# Patient Record
Sex: Male | Born: 1950 | Race: White | Hispanic: No | Marital: Married | Smoking: Current some day smoker
Health system: Southern US, Community
[De-identification: ages and names within clinical notes are randomized; demographics above are authoritative.]

## PROBLEM LIST (undated history)

## (undated) DIAGNOSIS — N529 Male erectile dysfunction, unspecified: Secondary | ICD-10-CM

## (undated) DIAGNOSIS — C801 Malignant (primary) neoplasm, unspecified: Secondary | ICD-10-CM

## (undated) DIAGNOSIS — Z524 Kidney donor: Secondary | ICD-10-CM

## (undated) DIAGNOSIS — N189 Chronic kidney disease, unspecified: Secondary | ICD-10-CM

## (undated) DIAGNOSIS — E119 Type 2 diabetes mellitus without complications: Secondary | ICD-10-CM

## (undated) DIAGNOSIS — K225 Diverticulum of esophagus, acquired: Secondary | ICD-10-CM

## (undated) DIAGNOSIS — I499 Cardiac arrhythmia, unspecified: Secondary | ICD-10-CM

## (undated) DIAGNOSIS — G4730 Sleep apnea, unspecified: Secondary | ICD-10-CM

## (undated) DIAGNOSIS — I10 Essential (primary) hypertension: Secondary | ICD-10-CM

## (undated) DIAGNOSIS — Z970 Presence of artificial eye: Secondary | ICD-10-CM

## (undated) DIAGNOSIS — I509 Heart failure, unspecified: Secondary | ICD-10-CM

## (undated) DIAGNOSIS — J449 Chronic obstructive pulmonary disease, unspecified: Secondary | ICD-10-CM

## (undated) DIAGNOSIS — I480 Paroxysmal atrial fibrillation: Secondary | ICD-10-CM

## (undated) DIAGNOSIS — I219 Acute myocardial infarction, unspecified: Secondary | ICD-10-CM

## (undated) DIAGNOSIS — I2510 Atherosclerotic heart disease of native coronary artery without angina pectoris: Secondary | ICD-10-CM

## (undated) DIAGNOSIS — F419 Anxiety disorder, unspecified: Secondary | ICD-10-CM

## (undated) DIAGNOSIS — K5790 Diverticulosis of intestine, part unspecified, without perforation or abscess without bleeding: Secondary | ICD-10-CM

## (undated) DIAGNOSIS — E785 Hyperlipidemia, unspecified: Secondary | ICD-10-CM

## (undated) DIAGNOSIS — K219 Gastro-esophageal reflux disease without esophagitis: Secondary | ICD-10-CM

## (undated) DIAGNOSIS — E039 Hypothyroidism, unspecified: Secondary | ICD-10-CM

## (undated) DIAGNOSIS — M1990 Unspecified osteoarthritis, unspecified site: Secondary | ICD-10-CM

## (undated) HISTORY — PX: MEDIAL PARTIAL KNEE REPLACEMENT: SHX5965

## (undated) HISTORY — DX: Type 2 diabetes mellitus without complications: E11.9

## (undated) HISTORY — DX: Diverticulum of esophagus, acquired: K22.5

## (undated) HISTORY — DX: Kidney donor: Z52.4

## (undated) HISTORY — DX: Diverticulosis of intestine, part unspecified, without perforation or abscess without bleeding: K57.90

## (undated) HISTORY — PX: HERNIA REPAIR: SHX51

## (undated) HISTORY — PX: TONSILLECTOMY: SUR1361

## (undated) HISTORY — DX: Chronic obstructive pulmonary disease, unspecified: J44.9

## (undated) HISTORY — PX: OTHER SURGICAL HISTORY: SHX169

## (undated) HISTORY — PX: UPPER GASTROINTESTINAL ENDOSCOPY: SHX188

## (undated) HISTORY — DX: Hyperlipidemia, unspecified: E78.5

## (undated) HISTORY — PX: COLONOSCOPY: SHX174

## (undated) HISTORY — DX: Chronic kidney disease, unspecified: N18.9

## (undated) HISTORY — DX: Gastro-esophageal reflux disease without esophagitis: K21.9

## (undated) HISTORY — PX: POLYPECTOMY: SHX149

---

## 1994-06-26 HISTORY — PX: KIDNEY DONATION: SHX685

## 2002-06-26 HISTORY — PX: CORONARY ARTERY BYPASS GRAFT: SHX141

## 2014-04-02 ENCOUNTER — Encounter (INDEPENDENT_AMBULATORY_CARE_PROVIDER_SITE_OTHER): Payer: Self-pay | Admitting: *Deleted

## 2014-04-16 ENCOUNTER — Ambulatory Visit (INDEPENDENT_AMBULATORY_CARE_PROVIDER_SITE_OTHER): Payer: Self-pay | Admitting: Internal Medicine

## 2015-06-27 DIAGNOSIS — D126 Benign neoplasm of colon, unspecified: Secondary | ICD-10-CM

## 2015-06-27 HISTORY — DX: Benign neoplasm of colon, unspecified: D12.6

## 2017-11-09 HISTORY — PX: CARDIAC CATHETERIZATION: SHX172

## 2019-05-15 ENCOUNTER — Other Ambulatory Visit: Payer: Self-pay | Admitting: Neurosurgery

## 2019-05-29 NOTE — H&P (Signed)
Patient ID:   [id number] Patient: Nathan Cross  Date of Birth: 1951-03-18 Visit Type: Office Visit   Date: 05/14/2019 03:00 PM Provider: Marchia Meiers. Vertell Limber MD   This 68 year old male presents for back pain.  HISTORY OF PRESENT ILLNESS:  1.  back pain  11/01/2018 left L2-3 microdiscectomy 05/01/2019 left L2-3 transforaminal ESI  Patient returns after reporting only 2 days relief from epidural injection.  He reports persistent low back and bilateral hamstring pain.  The patient continues to complain of significant pain and did not get sustained relief with injection.  Based on my review of his scoliosis radiographs and the severity of his MRI findings along with his continued pain, I have recommended proceeding with L1-2, L2-3, L3-4 XL IF with percutaneous pedicle screw fixation.  The patient wants to go ahead with surgery.  He says he is in intolerable pain.         Medical/Surgical/Interim History Reviewed, no change.  Last detailed document date:10/28/2018.     PAST MEDICAL HISTORY, SURGICAL HISTORY, FAMILY HISTORY, SOCIAL HISTORY AND REVIEW OF SYSTEMS I have reviewed the patient's past medical, surgical, family and social history as well as the comprehensive review of systems as included on the [facility] history form dated 10/28/2018, which I have signed.  Family History:  Reviewed, no changes.  Last detailed document date:10/28/2018.   Social History: Reviewed, no changes. Last detailed document date: 10/28/2018.    MEDICATIONS: (added, continued or stopped this visit) Started Medication Directions Instruction Stopped  05/07/2019 hydrocodone 5 mg-acetaminophen 325 mg tablet take 1 tablet by oral route  every 6 hours as needed for pain     lisinopril 5 mg tablet      metformin 500 mg tablet     04/23/2019 METHOCARBAMOL 500 MG TABLET TAKE 1 TABLET BY MOUTH THREE TIMES A DAY AS NEEDED     metoprolol tartrate 50 mg tablet      Xarelto 20  mg tablet        ALLERGIES: Ingredient Reaction Medication Name Comment  NO KNOWN ALLERGIES     No known allergies. Reviewed, no changes.    PHYSICAL EXAM:   Vitals Date Temp F BP Pulse Ht In Wt Lb BMI BSA Pain Score  05/14/2019 96.9 98/60 57 75 261.2 32.65  8/10      IMPRESSION:   Patient did not get significant improvement with injection.  He wants to go ahead with surgery.  PLAN:  L1-2, L2-3, L3-4 XL IF with percutaneous pedicle screw fixation.  The patient was given nursing education and we went over his studies in detail and the exact surgical plan and answered his questions.  He was given a prescription for an LSO brace.  Orders: Diagnostic Procedures: Assessment Procedure  M41.20 Scoliosis- AP/Lat  M54.16 Lumbar Spine- AP/Lat  Instruction(s)/Education: Assessment Instruction  [phone] Dietary management education, guidance, and counseling  Miscellaneous: Assessment   M43.16 LSO Brace   Completed Orders (this encounter) Order Details Reason Side Interpretation Result Initial Treatment Date Region  Scoliosis- AP/Lat      05/14/2019 All Levels to All Levels  Dietary management education, guidance, and counseling Encouraged patient to eat well balanced diet.         Assessment/Plan   # Detail Type Description   1. Assessment Radiculopathy, lumbar region (M54.16).       2. 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   Plan Orders LSO Brace. Clinical information/comments: gave patient written script.  3. Assessment Low back pain, unspecified back pain laterality, with sciatica presence unspecified (M54.5).       4. Assessment Disc displacement, lumbar (M51.26).       5. Assessment Scoliosis (and kyphoscoliosis), idiopathic (M41.20).       6. Assessment Body mass index (BMI) 32.0-32.9, adult FP:[id number]).   Plan Orders Today's instructions / counseling include(s) Dietary management education, guidance, and counseling. Clinical information/comments:  Encouraged patient to eat well balanced diet.         Pain Management Plan Pain Scale: 8/10. Method: Numeric Pain Intensity Scale. Location: back. Onset: 04/16/2019. Duration: varies. Quality: discomforting. Pain management follow-up plan of care: Patient will continue medication management.              Provider:  Marchia Meiers. Vertell Limber MD  05/16/2019 04:41 PM    Dictation edited by: Marchia Meiers. Vertell Limber    CC Providers: [facility] [place],  [place]  [id number]-   Najeeb Rehman  [facility] [address] [place], [place] [address]-               Electronically signed by Marchia Meiers Vertell Limber MD on 05/16/2019 04:41 PM

## 2019-06-05 NOTE — Progress Notes (Signed)
[id number] [address], [place] [address] Phone: [phone] Fax: [phone]      Your procedure is scheduled on Tuesday, June 10, 2019.  Report to [facility] Main Entrance "A" at 9:15 A.M., and check in at the Admitting office.  Call this number if you have problems the morning of surgery:  [phone]  Call [phone] if you have any questions prior to your surgery date Monday-Friday 8am-4pm    Remember:  Do not eat or drink after midnight the night before your surgery     Take these medicines the morning of surgery with A SIP OF WATER : Amiodarone (Pacerone) Clonazepam (Klonopin) Duloxetine (Cymbalta) Gabapentin (Neurontin) Gemfibrozil (Lopid) Hydrocodone-Acetaminophen (Norco/Vicodin) if needed Icosapent Ethyl (Vascepa) Levothyroxine (Synthroid) Methocarbamol (Robaxin) if needed Metoprolol succinate (toprol-XL) Omeprazole (Prilozec) Rosuvastatin (Crestor)  Follow your doctors instructions regarding when to STOP/HOLD Rivaroxaban (Xarelto). If no instructions were given, contact your doctor.  7 days prior to surgery STOP taking any Aspirin (unless otherwise instructed by your surgeon), Aleve, Naproxen, Ibuprofen, Motrin, Advil, Goody's, BC's, all herbal medications, fish oil, and all vitamins.   WHAT DO I DO ABOUT MY DIABETES MEDICATION?   Marland Kitchen Do not take oral diabetes medicines (pills) the morning of surgery. . Do NOT take Glucophage (Metformin) the morning of surgery.    HOW TO MANAGE YOUR DIABETES BEFORE AND AFTER SURGERY  Why is it important to control my blood sugar before and after surgery? . Improving blood sugar levels before and after surgery helps healing and can limit problems. . A way of improving blood sugar control is eating a healthy diet by: o  Eating less sugar and carbohydrates o  Increasing activity/exercise o  Talking with your doctor about reaching your blood sugar  goals . High blood sugars (greater than 180 mg/dL) can raise your risk of infections and slow your recovery, so you will need to focus on controlling your diabetes during the weeks before surgery. . Make sure that the doctor who takes care of your diabetes knows about your planned surgery including the date and location.  How do I manage my blood sugar before surgery? . Check your blood sugar at least 4 times a day, starting 2 days before surgery, to make sure that the level is not too high or low. . Check your blood sugar the morning of your surgery when you wake up and every 2 hours until you get to the Short Stay unit. o If your blood sugar is less than 70 mg/dL, you will need to treat for low blood sugar: - Do not take insulin. - Treat a low blood sugar (less than 70 mg/dL) with  cup of clear juice (cranberry or apple), 4 glucose tablets, OR glucose gel. - Recheck blood sugar in 15 minutes after treatment (to make sure it is greater than 70 mg/dL). If your blood sugar is not greater than 70 mg/dL on recheck, call [phone] for further instructions. . Report your blood sugar to the short stay nurse when you get to Short Stay.  . If you are admitted to the hospital after surgery: o Your blood sugar will be checked by the staff and you will probably be given insulin after surgery (instead of oral diabetes medicines) to make sure you have good blood sugar levels. o The goal for blood sugar control after surgery is 80-180 mg/dL.   The Morning of Surgery  Do not wear jewelry.  Do not wear  lotions, powders, or perfumes/colognes, or deodorant  Do not shave 48 hours prior to surgery.  Men may shave face and neck.  Do not bring valuables to the hospital.  [facility] is not responsible for any belongings or valuables.  If you are a smoker, DO NOT Smoke 24 hours prior to surgery  If you wear a CPAP at night please bring your mask, tubing, and machine the morning of surgery   Remember that  you must have someone to transport you home after your surgery, and remain with you for 24 hours if you are discharged the same day.   Please bring cases for contacts, glasses, hearing aids, dentures or bridgework because it cannot be worn into surgery.    Leave your suitcase in the car.  After surgery it may be brought to your room.  For patients admitted to the hospital, discharge time will be determined by your treatment team.  Patients discharged the day of surgery will not be allowed to drive home.    Special instructions:   [place]- Preparing For Surgery  Before surgery, you can play an important role. Because skin is not sterile, your skin needs to be as free of germs as possible. You can reduce the number of germs on your skin by washing with CHG (chlorahexidine gluconate) Soap before surgery.  CHG is an antiseptic cleaner which kills germs and bonds with the skin to continue killing germs even after washing.    Oral Hygiene is also important to reduce your risk of infection.  Remember - BRUSH YOUR TEETH THE MORNING OF SURGERY WITH YOUR REGULAR TOOTHPASTE  Please do not use if you have an allergy to CHG or antibacterial soaps. If your skin becomes reddened/irritated stop using the CHG.  Do not shave (including legs and underarms) for at least 48 hours prior to first CHG shower. It is OK to shave your face.  Please follow these instructions carefully.   1. Shower the NIGHT BEFORE SURGERY and the MORNING OF SURGERY with CHG Soap.   2. If you chose to wash your hair, wash your hair first as usual with your normal shampoo.  3. After you shampoo, rinse your hair and body thoroughly to remove the shampoo.  4. Use CHG as you would any other liquid soap. You can apply CHG directly to the skin and wash gently with a scrungie or a clean washcloth.   5. Apply the CHG Soap to your body ONLY FROM THE NECK DOWN.  Do not use on open wounds or open sores. Avoid contact with your eyes,  ears, mouth and genitals (private parts). Wash Face and genitals (private parts)  with your normal soap.   6. Wash thoroughly, paying special attention to the area where your surgery will be performed.  7. Thoroughly rinse your body with warm water from the neck down.  8. DO NOT shower/wash with your normal soap after using and rinsing off the CHG Soap.  9. Pat yourself dry with a CLEAN TOWEL.  10. Wear CLEAN PAJAMAS to bed the night before surgery, wear comfortable clothes the morning of surgery  11. Place CLEAN SHEETS on your bed the night of your first shower and DO NOT SLEEP WITH PETS.    Day of Surgery:  Please shower the morning of surgery with the CHG soap Do not apply any deodorants/lotions. Please wear clean clothes to the hospital/surgery center.   Remember to brush your teeth WITH YOUR REGULAR TOOTHPASTE.   Please read  over the following fact sheets that you were given.

## 2019-06-06 ENCOUNTER — Other Ambulatory Visit: Payer: Self-pay

## 2019-06-06 ENCOUNTER — Other Ambulatory Visit (HOSPITAL_COMMUNITY)
Admission: RE | Admit: 2019-06-06 | Discharge: 2019-06-06 | Disposition: A | Discharge status: Home / Self Care | Payer: Medicare Other | Source: Ambulatory Visit | Attending: Neurosurgery | Admitting: Neurosurgery

## 2019-06-06 ENCOUNTER — Encounter (HOSPITAL_COMMUNITY): Payer: Self-pay

## 2019-06-06 ENCOUNTER — Encounter (HOSPITAL_COMMUNITY)
Admission: RE | Admit: 2019-06-06 | Discharge: 2019-06-06 | Disposition: A | Discharge status: Home / Self Care | Payer: Medicare Other | Source: Ambulatory Visit | Attending: Neurosurgery | Admitting: Neurosurgery

## 2019-06-06 DIAGNOSIS — Z20828 Contact with and (suspected) exposure to other viral communicable diseases: Secondary | ICD-10-CM | POA: Diagnosis not present

## 2019-06-06 DIAGNOSIS — Z951 Presence of aortocoronary bypass graft: Secondary | ICD-10-CM | POA: Diagnosis not present

## 2019-06-06 DIAGNOSIS — R001 Bradycardia, unspecified: Secondary | ICD-10-CM | POA: Diagnosis not present

## 2019-06-06 DIAGNOSIS — Z6833 Body mass index (BMI) 33.0-33.9, adult: Secondary | ICD-10-CM | POA: Diagnosis not present

## 2019-06-06 DIAGNOSIS — Z01818 Encounter for other preprocedural examination: Secondary | ICD-10-CM | POA: Insufficient documentation

## 2019-06-06 DIAGNOSIS — Z7901 Long term (current) use of anticoagulants: Secondary | ICD-10-CM | POA: Diagnosis not present

## 2019-06-06 DIAGNOSIS — Z7984 Long term (current) use of oral hypoglycemic drugs: Secondary | ICD-10-CM | POA: Diagnosis not present

## 2019-06-06 DIAGNOSIS — E669 Obesity, unspecified: Secondary | ICD-10-CM | POA: Insufficient documentation

## 2019-06-06 DIAGNOSIS — I10 Essential (primary) hypertension: Secondary | ICD-10-CM | POA: Diagnosis not present

## 2019-06-06 DIAGNOSIS — Z905 Acquired absence of kidney: Secondary | ICD-10-CM | POA: Insufficient documentation

## 2019-06-06 DIAGNOSIS — E039 Hypothyroidism, unspecified: Secondary | ICD-10-CM | POA: Diagnosis not present

## 2019-06-06 DIAGNOSIS — I25810 Atherosclerosis of coronary artery bypass graft(s) without angina pectoris: Secondary | ICD-10-CM | POA: Insufficient documentation

## 2019-06-06 DIAGNOSIS — I480 Paroxysmal atrial fibrillation: Secondary | ICD-10-CM | POA: Insufficient documentation

## 2019-06-06 DIAGNOSIS — Z79899 Other long term (current) drug therapy: Secondary | ICD-10-CM | POA: Diagnosis not present

## 2019-06-06 DIAGNOSIS — G4733 Obstructive sleep apnea (adult) (pediatric): Secondary | ICD-10-CM | POA: Diagnosis not present

## 2019-06-06 DIAGNOSIS — Z79890 Hormone replacement therapy (postmenopausal): Secondary | ICD-10-CM | POA: Insufficient documentation

## 2019-06-06 DIAGNOSIS — I252 Old myocardial infarction: Secondary | ICD-10-CM | POA: Diagnosis not present

## 2019-06-06 DIAGNOSIS — Z87891 Personal history of nicotine dependence: Secondary | ICD-10-CM | POA: Insufficient documentation

## 2019-06-06 DIAGNOSIS — M4316 Spondylolisthesis, lumbar region: Secondary | ICD-10-CM | POA: Diagnosis not present

## 2019-06-06 HISTORY — DX: Paroxysmal atrial fibrillation: I48.0

## 2019-06-06 HISTORY — DX: Hypothyroidism, unspecified: E03.9

## 2019-06-06 HISTORY — DX: Essential (primary) hypertension: I10

## 2019-06-06 HISTORY — DX: Sleep apnea, unspecified: G47.30

## 2019-06-06 HISTORY — DX: Acute myocardial infarction, unspecified: I21.9

## 2019-06-06 LAB — BASIC METABOLIC PANEL
Anion gap: 10 (ref 5–15)
BUN: 15 mg/dL (ref 8–23)
CO2: 25 mmol/L (ref 22–32)
Calcium: 8.8 mg/dL — ABNORMAL LOW (ref 8.9–10.3)
Chloride: 108 mmol/L (ref 98–111)
Creatinine, Ser: 1.2 mg/dL (ref 0.61–1.24)
GFR calc Af Amer: >60 mL/min (ref >60)
GFR calc non Af Amer: >60 mL/min (ref >60)
Glucose, Bld: 111 mg/dL — ABNORMAL HIGH (ref 70–99)
Potassium: 4.5 mmol/L (ref 3.5–5.1)
Sodium: 143 mmol/L (ref 135–145)

## 2019-06-06 LAB — SURGICAL PCR SCREEN
MRSA, PCR: NEGATIVE
Staphylococcus aureus: NEGATIVE

## 2019-06-06 LAB — TYPE AND SCREEN
ABO/RH(D): O NEG
Antibody Screen: NEGATIVE

## 2019-06-06 LAB — CBC
HCT: 38.1 % — ABNORMAL LOW (ref 39.0–52.0)
Hemoglobin: 12.5 g/dL — ABNORMAL LOW (ref 13.0–17.0)
MCH: 30.7 pg (ref 26.0–34.0)
MCHC: 32.8 g/dL (ref 30.0–36.0)
MCV: 93.6 fL (ref 80.0–100.0)
Platelets: 210 10*3/uL (ref 150–400)
RBC: 4.07 MIL/uL — ABNORMAL LOW (ref 4.22–5.81)
RDW: 14 % (ref 11.5–15.5)
WBC: 7 10*3/uL (ref 4.0–10.5)
nRBC: 0 % (ref 0.0–0.2)

## 2019-06-06 LAB — HEMOGLOBIN A1C
Hgb A1c MFr Bld: 5.6 % (ref 4.8–5.6)
Mean Plasma Glucose: 114.02 mg/dL

## 2019-06-06 LAB — GLUCOSE, CAPILLARY: Glucose-Capillary: 90 mg/dL (ref 70–99)

## 2019-06-06 LAB — ABO/RH: ABO/RH(D): O NEG

## 2019-06-06 NOTE — Progress Notes (Signed)
PCP - Thornton Papas Cardiologist - Bosh Zakhary   Chest x-ray - n/a EKG - 06-06-19  SA - yes, wears CPAP  DM - Type 2 Fasting Blood Sugar - 100-120s   Blood Thinner Instructions: Last dose of Xarelto 06-05-19 Aspirin Instructions: Last dose 06-05-19   COVID TEST- Friday, Dec. 11th   Anesthesia review: yes, heart history  Patient denies shortness of breath, fever, cough and chest pain at PAT appointment   All instructions explained to the patient, with a verbal understanding of the material. Patient agrees to go over the instructions while at home for a better understanding. Patient also instructed to self quarantine after being tested for COVID-19. The opportunity to ask questions was provided.

## 2019-06-06 NOTE — Progress Notes (Signed)
[id number] [address], [place] [address] Phone: [phone] Fax: [phone]      Your procedure is scheduled on Tuesday, June 10, 2019.  Report to [facility] Main Entrance "A" at 9:15 A.M., and check in at the Admitting office.  Call this number if you have problems the morning of surgery:  [phone]  Call [phone] if you have any questions prior to your surgery date Monday-Friday 8am-4pm    Remember:  Do not eat or drink after midnight the night before your surgery     Take these medicines the morning of surgery with A SIP OF WATER :  Amiodarone (Pacerone)  Clonazepam (Klonopin)  Duloxetine (Cymbalta)  Gabapentin (Neurontin)  Gemfibrozil (Lopid)  Hydrocodone-Acetaminophen (Norco/Vicodin) if needed  Icosapent Ethyl (Vascepa)  Levothyroxine (Synthroid)  Methocarbamol (Robaxin) if needed  Metoprolol succinate (toprol-XL)  Omeprazole (Prilozec)  Rosuvastatin (Crestor)  Follow your doctors instructions regarding when to STOP/HOLD Rivaroxaban (Xarelto). If no instructions were given, contact your doctor.  7 days prior to surgery STOP taking any Aspirin (unless otherwise instructed by your surgeon), Aleve, Naproxen, Ibuprofen, Motrin, Advil, Goody's, BC's, all herbal medications, fish oil, and all vitamins.   WHAT DO I DO ABOUT MY DIABETES MEDICATION?   Marland Kitchen Do not take oral diabetes medicines (pills) the morning of surgery. . Do NOT take Glucophage (Metformin) the morning of surgery.    HOW TO MANAGE YOUR DIABETES BEFORE AND AFTER SURGERY  Why is it important to control my blood sugar before and after surgery? . Improving blood sugar levels before and after surgery helps healing and can limit problems. . A way of improving blood sugar control is eating a healthy diet by: o  Eating less sugar and carbohydrates o  Increasing activity/exercise o  Talking with your doctor about reaching your blood sugar  goals . High blood sugars (greater than 180 mg/dL) can raise your risk of infections and slow your recovery, so you will need to focus on controlling your diabetes during the weeks before surgery. . Make sure that the doctor who takes care of your diabetes knows about your planned surgery including the date and location.  How do I manage my blood sugar before surgery? . Check your blood sugar at least 4 times a day, starting 2 days before surgery, to make sure that the level is not too high or low. . Check your blood sugar the morning of your surgery when you wake up and every 2 hours until you get to the Short Stay unit. o If your blood sugar is less than 70 mg/dL, you will need to treat for low blood sugar: - Do not take insulin. - Treat a low blood sugar (less than 70 mg/dL) with  cup of clear juice (cranberry or apple), 4 glucose tablets, OR glucose gel. - Recheck blood sugar in 15 minutes after treatment (to make sure it is greater than 70 mg/dL). If your blood sugar is not greater than 70 mg/dL on recheck, call [phone] for further instructions. . Report your blood sugar to the short stay nurse when you get to Short Stay.  . If you are admitted to the hospital after surgery: o Your blood sugar will be checked by the staff and you will probably be given insulin after surgery (instead of oral diabetes medicines) to make sure you have good blood sugar levels. o The goal for blood sugar control after surgery is 80-180 mg/dL.   The  Morning of Surgery  Do not wear jewelry.  Do not wear lotions, powders, or perfumes, or deodorant  Do not shave 48 hours prior to surgery.    Do not bring valuables to the hospital.  [facility] is not responsible for any belongings or valuables.  If you are a smoker, DO NOT Smoke 24 hours prior to surgery  If you wear a CPAP at night please bring your mask, tubing, and machine the morning of surgery   Remember that you must have someone to transport you  home after your surgery, and remain with you for 24 hours if you are discharged the same day.   Please bring cases for contacts, glasses, hearing aids, dentures or bridgework because it cannot be worn into surgery.    Leave your suitcase in the car.  After surgery it may be brought to your room.  For patients admitted to the hospital, discharge time will be determined by your treatment team.  Patients discharged the day of surgery will not be allowed to drive home.    Special instructions:   [place]- Preparing For Surgery  Before surgery, you can play an important role. Because skin is not sterile, your skin needs to be as free of germs as possible. You can reduce the number of germs on your skin by washing with CHG (chlorahexidine gluconate) Soap before surgery.  CHG is an antiseptic cleaner which kills germs and bonds with the skin to continue killing germs even after washing.    Oral Hygiene is also important to reduce your risk of infection.  Remember - BRUSH YOUR TEETH THE MORNING OF SURGERY WITH YOUR REGULAR TOOTHPASTE  Please do not use if you have an allergy to CHG or antibacterial soaps. If your skin becomes reddened/irritated stop using the CHG.  Do not shave (including legs and underarms) for at least 48 hours prior to first CHG shower. It is OK to shave your face.  Please follow these instructions carefully.   1. Shower the NIGHT BEFORE SURGERY and the MORNING OF SURGERY with CHG Soap.   2. If you chose to wash your hair, wash your hair first as usual with your normal shampoo.  3. After you shampoo, rinse your hair and body thoroughly to remove the shampoo.  4. Use CHG as you would any other liquid soap. You can apply CHG directly to the skin and wash gently with a scrungie or a clean washcloth.   5. Apply the CHG Soap to your body ONLY FROM THE NECK DOWN.  Do not use on open wounds or open sores. Avoid contact with your eyes, ears, mouth and genitals (private parts).  Wash Face and genitals (private parts)  with your normal soap.   6. Wash thoroughly, paying special attention to the area where your surgery will be performed.  7. Thoroughly rinse your body with warm water from the neck down.  8. DO NOT shower/wash with your normal soap after using and rinsing off the CHG Soap.  9. Pat yourself dry with a CLEAN TOWEL.  10. Wear CLEAN PAJAMAS to bed the night before surgery, wear comfortable clothes the morning of surgery  11. Place CLEAN SHEETS on your bed the night of your first shower and DO NOT SLEEP WITH PETS.    Day of Surgery:  Please shower the morning of surgery with the CHG soap Do not apply any deodorants/lotions. Please wear clean clothes to the hospital/surgery center.   Remember to brush your teeth WITH  YOUR REGULAR TOOTHPASTE.   Please read over the following fact sheets that you were given.

## 2019-06-07 LAB — NOVEL CORONAVIRUS, NAA (HOSP ORDER, SEND-OUT TO REF LAB; TAT 18-24 HRS): SARS-CoV-2, NAA: NOT DETECTED

## 2019-06-09 ENCOUNTER — Encounter (HOSPITAL_COMMUNITY): Payer: Self-pay

## 2019-06-09 MED ORDER — DEXTROSE 5 % IV SOLN
3.0000 g | INTRAVENOUS | Status: AC
  Administered 2019-06-10 (×2): 3 g via INTRAVENOUS
  Filled 2019-06-09 (×3): qty 3000

## 2019-06-09 NOTE — Anesthesia Preprocedure Evaluation (Addendum)
Anesthesia Evaluation  Patient identified by MRN, date of birth, ID band Patient awake    Reviewed: Allergy & Precautions, NPO status , Patient's Chart, lab work & pertinent test results, reviewed documented beta blocker date and time   Airway Mallampati: II  TM Distance: >3 FB Neck ROM: Full    Dental  (+) Teeth Intact, Dental Advisory Given   Pulmonary sleep apnea and Continuous Positive Airway Pressure Ventilation , Current Smoker and Patient abstained from smoking.,    Pulmonary exam normal breath sounds clear to auscultation       Cardiovascular hypertension, Pt. on medications and Pt. on home beta blockers (-) angina+ CAD, + Past MI, + Cardiac Stents and + CABG  Normal cardiovascular exam+ dysrhythmias Atrial Fibrillation  Rhythm:Regular Rate:Normal  CAD (s/p CABG ~ 2004 in [place], [place]; by notes in [place], "LIMA-to-LAD, SVG-to-RPDA, SVG-to-OM, he does have a L radial artery graft"; s/p DES to SVG-RPDA ~ 2012; inferior STEMI with mild LV dysfunction 45% with occluded DES at SVG-RPDA in setting of not being on DAPT due to GI bleed, no revascularized 03/2014)  Nuclear stress test 05/06/19 ([facility]): Perfusion imaging: There is a moderate sized fixed perfusion abnormality of moderate intensity in the inferior wall from the base to the apex.  The remainder the ventricle has normal perfusion at rest and with stress. Wall motion: There is hypokinesis in the inferior wall from the base to the apex.  The remainder the ventricle functions normally.  The LV cavity size is normal at rest and with stress, is unchanged.  The ejection fraction is 69%. Overall: This vasodilator stress test is negative for ischemia, but with evidence of infarction.  LVEF 69%. (Comparison NST 11/10/14 in [facility]: Inferior myocardial infarction with peri-infarct ischemia, EF 62%)  Other studies outlined in 04/01/19 office note by Dr. Rosalita Chessman (will attempt  to get copies for [facility]): - TTE 11/12/17: " Ejection fraction 50% inferior wall hypokinesis LVH, biatrial enlargement, mild TR" (Comparison echo 02/01/15 in [facility]: LVEF 50-55%) - Cardiac cath 11/09/17: " Normal right-sided pressures, 80% proximal LAD, 100% ostial circ, 100% RCA, SVG-RCA 100%, SVG-OM patent, LIMA-LAD patent, L-L and L-R collateral of distal cx and PDA"   Neuro/Psych negative neurological ROS  negative psych ROS   GI/Hepatic Neg liver ROS, GERD  Medicated,  Endo/Other  diabetes, Type 2, Oral Hypoglycemic AgentsHypothyroidism Obesity   Renal/GU S/p nephrectomy     Musculoskeletal negative musculoskeletal ROS (+)   Abdominal   Peds  Hematology  (+) Blood dyscrasia (Xarelto), anemia ,   Anesthesia Other Findings Day of surgery medications reviewed with the patient.  Reproductive/Obstetrics                            Anesthesia Physical Anesthesia Plan  ASA: III  Anesthesia Plan: General   Post-op Pain Management:    Induction: Intravenous  PONV Risk Score and Plan: 2 and Dexamethasone, Ondansetron and Midazolam  Airway Management Planned: Oral ETT  Additional Equipment:   Intra-op Plan:   Post-operative Plan: Extubation in OR  Informed Consent: I have reviewed the patients History and Physical, chart, labs and discussed the procedure including the risks, benefits and alternatives for the proposed anesthesia with the patient or authorized representative who has indicated his/her understanding and acceptance.     Dental advisory given  Plan Discussed with: CRNA  Anesthesia Plan Comments: (PAT note written 06/09/2019 by Myra Gianotti, PA-C. )  Anesthesia Quick Evaluation  

## 2019-06-09 NOTE — Progress Notes (Addendum)
Anesthesia Chart Review:  Case: [id number] Date/Time: 06/10/19 1103   Procedures:      Lumbar 1-2, Lumbar 2-3, Lumbar 3-4 Anterolateral decompression/fusion with percutaneous pedicle screw fixation (N/A ) - Lumbar 1-2, Lumbar 2-3, Lumbar 3-4 Anterolateral decompression/fusion with percutaneous pedicle screw fixation     LUMBAR PERCUTANEOUS PEDICLE SCREW 3 LEVEL (N/A )   Anesthesia type: General   Pre-op diagnosis: Spondylolisthesis, Lumbar region   Location: [facility] OR ROOM 18 / [facility]   Surgeons: Erline Levine, MD      DISCUSSION: Patient is a 68 year old male scheduled for the above procedure.  History includes smoking, CAD (s/p CABG ~ 2004 in [place], [place]; by notes in [place], "LIMA-to-LAD, SVG-to-RPDA, SVG-to-OM, he does have a L radial artery graft"; s/p DES to SVG-RPDA ~ 2012; inferior STEMI with mild LV dysfunction 45% with occluded DES at SVG-RPDA in setting of not being on DAPT due to GI bleed, no revascularized 03/2014), PAF, OSA (CPAP), HTN, hypothyroidism, left prosthetic eye. 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 BMI is consistent with obesity.  His cardiologist Dr. Rosalita Chessman signed a note of cardiac clearance for this procedure. He gave permission to hold blood thinners for 48 hours prior to surgery and resume post-operatively. At PAT, patient reported, last Xarelto and ASA 06/05/19.   Currently only clearance letter and 2016-2017 cardiology records [facility]) available. Urgent requested for most recent cardiac records sent to [facility] 06/09/19 at 10:30 PM--no records received by 4:00 PM, so I contact [facility] staff who reported that patient was last seen ~ 03/2019 and had a recent stress test showing evidence of prior infarct with plans to continue medical therapy. Staff to fax records, but not received as of 5:05 PM. Will need to contact Dr. Milta Deiters office at [phone] if not received by arrival 06/10/19.    06/06/19 COVID-19 test negative. He is for PT/INR on the day of surgery.  ADDENDUM 06/10/19 9:40 AM: 04/01/19 office note and 05/06/19 non-ischemic stress test received from [facility]. See CV section.     VS: BP 133/70   Pulse (!) 58   Temp (!) 36.4 C (Oral)   Resp 20   Ht 6\' 3"  (1.905 m)   Wt 119.9 kg   SpO2 97%   BMI 33.05 kg/m   PROVIDERS: Frances Maywood, FNP is PCP Delanna Notice, MD is cardiologist ([place]). It appears that previously he was followed by [facility]iology in from 10/30/14-08/09/15 (see Care Everywhere).   LABS: Labs reviewed: Acceptable for surgery. (all labs ordered are listed, but only abnormal results are displayed)  Labs Reviewed  BASIC METABOLIC PANEL - Abnormal; Notable for the following components:      Result Value   Glucose, Bld 111 (*)    Calcium 8.8 (*)    All other components within normal limits  CBC - Abnormal; Notable for the following components:   RBC 4.07 (*)    Hemoglobin 12.5 (*)    HCT 38.1 (*)    All other components within normal limits  SURGICAL PCR SCREEN  GLUCOSE, CAPILLARY  HEMOGLOBIN A1C  TYPE AND SCREEN     EKG: 06/06/19: Sinus bradycardia at 54 bpm Otherwise normal ECG Confirmed by Croitoru, Mihai [id number]) on 06/07/2019 5:57:48 PM   CV:  Nuclear stress test 05/06/19 ([facility]): Perfusion imaging: There is a moderate sized fixed perfusion abnormality of moderate intensity in  the inferior wall from the base to the apex.  The remainder the ventricle has normal perfusion at rest and with stress. Wall motion: There is hypokinesis in the inferior wall from the base to the apex.  The remainder the ventricle functions normally.  The LV cavity size is normal at rest and with stress, is unchanged.  The ejection fraction is 69%. Overall: This vasodilator stress test is negative for ischemia, but with evidence of infarction.  LVEF 69%. (Comparison NST 11/10/14 in [facility] CE: Inferior  myocardial infarction with peri-infarct ischemia, EF 62%)  Other studies outlined in 04/01/19 office note by Dr. Rosalita Chessman (will attempt to get copies for [facility]): - TTE 11/12/17: " Ejection fraction 50% inferior wall hypokinesis LVH, biatrial enlargement, mild TR" (Comparison echo 02/01/15 in [facility] CE: LVEF 50-55%) - Cardiac cath 11/09/17: " Normal right-sided pressures, 80% proximal LAD, 100% ostial circ, 100% RCA, SVG-RCA 100%, SVG-OM patent, LIMA-LAD patent, L-L and L-R collateral of distal cx and PDA"    Past Medical History:  Diagnosis Date  . Hypertension   . Hypothyroidism   . Myocardial infarction ([place])   . PAF (paroxysmal atrial fibrillation) ([place])   . Sleep apnea     Past Surgical History:  Procedure Laterality Date  . CORONARY ARTERY BYPASS GRAFT  2004  . HERNIA REPAIR    . MEDIAL PARTIAL KNEE REPLACEMENT Bilateral   . Prosthetic Eye Left     MEDICATIONS: . amiodarone (PACERONE) 200 MG tablet  . clobetasol cream (TEMOVATE) 0.05 %  . clonazePAM (KLONOPIN) 0.5 MG tablet  . docusate sodium (COLACE) 100 MG capsule  . DULoxetine (CYMBALTA) 30 MG capsule  . furosemide (LASIX) 20 MG tablet  . gabapentin (NEURONTIN) 300 MG capsule  . gemfibrozil (LOPID) 600 MG tablet  . HYDROcodone-acetaminophen (NORCO/VICODIN) 5-325 MG tablet  . icosapent Ethyl (VASCEPA) 1 g capsule  . levothyroxine (SYNTHROID) 50 MCG tablet  . lisinopril (ZESTRIL) 5 MG tablet  . metFORMIN (GLUCOPHAGE) 500 MG tablet  . methocarbamol (ROBAXIN) 500 MG tablet  . metoprolol succinate (TOPROL-XL) 50 MG 24 hr tablet  . naproxen sodium (ALEVE) 220 MG tablet  . omeprazole (PRILOSEC) 20 MG capsule  . rivaroxaban (XARELTO) 20 MG TABS tablet  . rosuvastatin (CRESTOR) 20 MG tablet  . vitamin C (ASCORBIC ACID) 500 MG tablet   No current facility-administered medications for this encounter.   Derrill Memo ON 06/10/2019] ceFAZolin (ANCEF) 3 g in dextrose 5 % 50 mL IVPB    Myra Gianotti, PA-C Surgical Short  Stay/Anesthesiology [facility] Phone [phone] [facility] Phone [phone] 06/09/2019 5:05 PM

## 2019-06-10 ENCOUNTER — Inpatient Hospital Stay (HOSPITAL_COMMUNITY): Payer: Medicare Other

## 2019-06-10 ENCOUNTER — Inpatient Hospital Stay (HOSPITAL_COMMUNITY): Payer: Medicare Other | Admitting: Anesthesiology

## 2019-06-10 ENCOUNTER — Encounter (HOSPITAL_COMMUNITY): Payer: Self-pay | Admitting: Neurosurgery

## 2019-06-10 ENCOUNTER — Inpatient Hospital Stay (HOSPITAL_COMMUNITY)
Admission: RE | Disposition: A | Discharge status: Home / Self Care | Payer: Self-pay | Source: Home / Self Care | Attending: Neurosurgery

## 2019-06-10 ENCOUNTER — Inpatient Hospital Stay (HOSPITAL_COMMUNITY)
Admission: RE | Admit: 2019-06-10 | Discharge: 2019-06-11 | DRG: 460 | Disposition: A | Discharge status: Home / Self Care | Payer: Medicare Other | Attending: Neurosurgery | Admitting: Neurosurgery

## 2019-06-10 ENCOUNTER — Other Ambulatory Visit: Payer: Self-pay

## 2019-06-10 ENCOUNTER — Inpatient Hospital Stay (HOSPITAL_COMMUNITY): Payer: Medicare Other | Admitting: Vascular Surgery

## 2019-06-10 DIAGNOSIS — F17200 Nicotine dependence, unspecified, uncomplicated: Secondary | ICD-10-CM | POA: Diagnosis present

## 2019-06-10 DIAGNOSIS — Z7984 Long term (current) use of oral hypoglycemic drugs: Secondary | ICD-10-CM

## 2019-06-10 DIAGNOSIS — E119 Type 2 diabetes mellitus without complications: Secondary | ICD-10-CM | POA: Diagnosis present

## 2019-06-10 DIAGNOSIS — Z951 Presence of aortocoronary bypass graft: Secondary | ICD-10-CM | POA: Diagnosis not present

## 2019-06-10 DIAGNOSIS — I10 Essential (primary) hypertension: Secondary | ICD-10-CM | POA: Diagnosis present

## 2019-06-10 DIAGNOSIS — M5116 Intervertebral disc disorders with radiculopathy, lumbar region: Secondary | ICD-10-CM | POA: Diagnosis present

## 2019-06-10 DIAGNOSIS — M4126 Other idiopathic scoliosis, lumbar region: Secondary | ICD-10-CM | POA: Diagnosis present

## 2019-06-10 DIAGNOSIS — I252 Old myocardial infarction: Secondary | ICD-10-CM | POA: Diagnosis not present

## 2019-06-10 DIAGNOSIS — G4730 Sleep apnea, unspecified: Secondary | ICD-10-CM | POA: Diagnosis present

## 2019-06-10 DIAGNOSIS — Z79899 Other long term (current) drug therapy: Secondary | ICD-10-CM | POA: Diagnosis not present

## 2019-06-10 DIAGNOSIS — Z955 Presence of coronary angioplasty implant and graft: Secondary | ICD-10-CM | POA: Diagnosis not present

## 2019-06-10 DIAGNOSIS — M4316 Spondylolisthesis, lumbar region: Principal | ICD-10-CM | POA: Diagnosis present

## 2019-06-10 DIAGNOSIS — Z6832 Body mass index (BMI) 32.0-32.9, adult: Secondary | ICD-10-CM | POA: Diagnosis not present

## 2019-06-10 DIAGNOSIS — M48061 Spinal stenosis, lumbar region without neurogenic claudication: Secondary | ICD-10-CM | POA: Diagnosis present

## 2019-06-10 DIAGNOSIS — I2510 Atherosclerotic heart disease of native coronary artery without angina pectoris: Secondary | ICD-10-CM | POA: Diagnosis present

## 2019-06-10 DIAGNOSIS — E669 Obesity, unspecified: Secondary | ICD-10-CM | POA: Diagnosis present

## 2019-06-10 DIAGNOSIS — M419 Scoliosis, unspecified: Secondary | ICD-10-CM | POA: Diagnosis present

## 2019-06-10 DIAGNOSIS — Z7901 Long term (current) use of anticoagulants: Secondary | ICD-10-CM

## 2019-06-10 DIAGNOSIS — K219 Gastro-esophageal reflux disease without esophagitis: Secondary | ICD-10-CM | POA: Diagnosis present

## 2019-06-10 DIAGNOSIS — Z905 Acquired absence of kidney: Secondary | ICD-10-CM | POA: Diagnosis not present

## 2019-06-10 DIAGNOSIS — Z79890 Hormone replacement therapy (postmenopausal): Secondary | ICD-10-CM

## 2019-06-10 DIAGNOSIS — Z419 Encounter for procedure for purposes other than remedying health state, unspecified: Secondary | ICD-10-CM

## 2019-06-10 HISTORY — PX: ANTERIOR LAT LUMBAR FUSION: SHX1168

## 2019-06-10 HISTORY — PX: LUMBAR PERCUTANEOUS PEDICLE SCREW 3 LEVEL: SHX5562

## 2019-06-10 LAB — GLUCOSE, CAPILLARY: Glucose-Capillary: 116 mg/dL — ABNORMAL HIGH (ref 70–99)

## 2019-06-10 LAB — PROTIME-INR
INR: 1 (ref 0.8–1.2)
Prothrombin Time: 13 seconds (ref 11.4–15.2)

## 2019-06-10 SURGERY — ANTERIOR LATERAL LUMBAR FUSION 3 LEVELS
Anesthesia: General

## 2019-06-10 MED ORDER — HYDROMORPHONE HCL 1 MG/ML IJ SOLN
1.0000 mg | INTRAMUSCULAR | Status: DC | PRN
  Administered 2019-06-10: 21:00:00 1 mg via INTRAVENOUS
  Filled 2019-06-10: qty 1

## 2019-06-10 MED ORDER — ACETAMINOPHEN 10 MG/ML IV SOLN
INTRAVENOUS | Status: AC
  Filled 2019-06-10: qty 100

## 2019-06-10 MED ORDER — LEVOTHYROXINE SODIUM 25 MCG PO TABS
50.0000 ug | ORAL_TABLET | Freq: Every day | ORAL | Status: DC
  Administered 2019-06-11: 07:00:00 50 ug via ORAL
  Filled 2019-06-10: qty 2

## 2019-06-10 MED ORDER — THROMBIN 20000 UNITS EX SOLR
CUTANEOUS | Status: AC
  Filled 2019-06-10: qty 20000

## 2019-06-10 MED ORDER — MIDAZOLAM HCL 5 MG/5ML IJ SOLN
1.0000 mg | Freq: Once | INTRAMUSCULAR | Status: AC
  Administered 2019-06-10: 17:00:00 1 mg via INTRAVENOUS

## 2019-06-10 MED ORDER — EPHEDRINE SULFATE 50 MG/ML IJ SOLN
INTRAMUSCULAR | Status: DC | PRN
  Administered 2019-06-10 (×5): 10 mg via INTRAVENOUS

## 2019-06-10 MED ORDER — ACETAMINOPHEN 10 MG/ML IV SOLN
1000.0000 mg | Freq: Once | INTRAVENOUS | Status: AC
  Administered 2019-06-10: 18:00:00 1000 mg via INTRAVENOUS

## 2019-06-10 MED ORDER — DOCUSATE SODIUM 100 MG PO CAPS: 200.0000 mg | ORAL_CAPSULE | Freq: Every day | ORAL | Status: DC

## 2019-06-10 MED ORDER — PHENYLEPHRINE HCL (PRESSORS) 10 MG/ML IV SOLN
INTRAVENOUS | Status: DC | PRN
  Administered 2019-06-10: 120 ug via INTRAVENOUS
  Administered 2019-06-10 (×2): 80 ug via INTRAVENOUS

## 2019-06-10 MED ORDER — ACETAMINOPHEN 650 MG RE SUPP: 650.0000 mg | RECTAL | Status: DC | PRN

## 2019-06-10 MED ORDER — ICOSAPENT ETHYL 1 G PO CAPS
1.0000 g | ORAL_CAPSULE | Freq: Every day | ORAL | Status: DC
  Administered 2019-06-11: 09:00:00 1 g via ORAL
  Filled 2019-06-10 (×2): qty 1

## 2019-06-10 MED ORDER — BISACODYL 10 MG RE SUPP: 10.0000 mg | Freq: Every day | RECTAL | Status: DC | PRN

## 2019-06-10 MED ORDER — LACTATED RINGERS IV SOLN: INTRAVENOUS | Status: DC

## 2019-06-10 MED ORDER — SODIUM CHLORIDE 0.9 % IV SOLN: 250.0000 mL | INTRAVENOUS | Status: DC

## 2019-06-10 MED ORDER — METHOCARBAMOL 1000 MG/10ML IJ SOLN
500.0000 mg | Freq: Four times a day (QID) | INTRAVENOUS | Status: DC | PRN
  Administered 2019-06-11: 01:00:00 500 mg via INTRAVENOUS
  Filled 2019-06-10: qty 5
  Filled 2019-06-10: qty 500

## 2019-06-10 MED ORDER — AMIODARONE HCL 100 MG PO TABS
100.0000 mg | ORAL_TABLET | Freq: Every day | ORAL | Status: DC
  Administered 2019-06-11: 09:00:00 100 mg via ORAL
  Filled 2019-06-10: qty 1

## 2019-06-10 MED ORDER — METHOCARBAMOL 500 MG PO TABS
ORAL_TABLET | ORAL | Status: AC
  Filled 2019-06-10: qty 1

## 2019-06-10 MED ORDER — FLEET ENEMA 7-19 GM/118ML RE ENEM: 1.0000 | ENEMA | Freq: Once | RECTAL | Status: DC | PRN

## 2019-06-10 MED ORDER — MENTHOL 3 MG MT LOZG: 1.0000 | LOZENGE | OROMUCOSAL | Status: DC | PRN

## 2019-06-10 MED ORDER — THROMBIN 5000 UNITS EX SOLR
CUTANEOUS | Status: AC
  Filled 2019-06-10: qty 5000

## 2019-06-10 MED ORDER — HYDROCODONE-ACETAMINOPHEN 5-325 MG PO TABS: 1.0000 | ORAL_TABLET | Freq: Four times a day (QID) | ORAL | Status: DC | PRN

## 2019-06-10 MED ORDER — GEMFIBROZIL 600 MG PO TABS
600.0000 mg | ORAL_TABLET | Freq: Two times a day (BID) | ORAL | Status: DC
  Administered 2019-06-11: 09:00:00 600 mg via ORAL
  Filled 2019-06-10 (×2): qty 1

## 2019-06-10 MED ORDER — VASOPRESSIN 20 UNIT/ML IV SOLN
INTRAVENOUS | Status: AC
  Filled 2019-06-10: qty 1

## 2019-06-10 MED ORDER — POLYETHYLENE GLYCOL 3350 17 G PO PACK: 17.0000 g | PACK | Freq: Every day | ORAL | Status: DC | PRN

## 2019-06-10 MED ORDER — SODIUM CHLORIDE 0.9% FLUSH: 3.0000 mL | INTRAVENOUS | Status: DC | PRN

## 2019-06-10 MED ORDER — ONDANSETRON HCL 4 MG/2ML IJ SOLN: 4.0000 mg | Freq: Once | INTRAMUSCULAR | Status: DC | PRN

## 2019-06-10 MED ORDER — ROSUVASTATIN CALCIUM 20 MG PO TABS
20.0000 mg | ORAL_TABLET | Freq: Every day | ORAL | Status: DC
  Administered 2019-06-11: 08:00:00 20 mg via ORAL
  Filled 2019-06-10: qty 1

## 2019-06-10 MED ORDER — PROPOFOL 500 MG/50ML IV EMUL: INTRAVENOUS | Status: DC | PRN

## 2019-06-10 MED ORDER — BUPIVACAINE HCL (PF) 0.5 % IJ SOLN
INTRAMUSCULAR | Status: AC
  Filled 2019-06-10: qty 30

## 2019-06-10 MED ORDER — BUPIVACAINE LIPOSOME 1.3 % IJ SUSP
20.0000 mL | INTRAMUSCULAR | Status: AC
  Administered 2019-06-10: 16:00:00 20 mL
  Filled 2019-06-10: qty 20

## 2019-06-10 MED ORDER — HYDROMORPHONE HCL 1 MG/ML IJ SOLN
INTRAMUSCULAR | Status: AC
  Filled 2019-06-10: qty 1

## 2019-06-10 MED ORDER — CHLORHEXIDINE GLUCONATE CLOTH 2 % EX PADS: 6.0000 | MEDICATED_PAD | Freq: Once | CUTANEOUS | Status: DC

## 2019-06-10 MED ORDER — PROPOFOL 500 MG/50ML IV EMUL
INTRAVENOUS | Status: DC | PRN
  Administered 2019-06-10: 50 ug/kg/min via INTRAVENOUS

## 2019-06-10 MED ORDER — ROCURONIUM BROMIDE 10 MG/ML (PF) SYRINGE
PREFILLED_SYRINGE | INTRAVENOUS | Status: AC
  Filled 2019-06-10: qty 30

## 2019-06-10 MED ORDER — FENTANYL CITRATE (PF) 250 MCG/5ML IJ SOLN
INTRAMUSCULAR | Status: AC
  Filled 2019-06-10: qty 5

## 2019-06-10 MED ORDER — DOCUSATE SODIUM 100 MG PO CAPS
100.0000 mg | ORAL_CAPSULE | Freq: Two times a day (BID) | ORAL | Status: DC
  Administered 2019-06-11: 100 mg via ORAL
  Filled 2019-06-10: qty 1

## 2019-06-10 MED ORDER — LISINOPRIL 10 MG PO TABS
5.0000 mg | ORAL_TABLET | Freq: Every day | ORAL | Status: DC
  Administered 2019-06-11: 08:00:00 5 mg via ORAL
  Filled 2019-06-10: qty 1

## 2019-06-10 MED ORDER — PROPOFOL 1000 MG/100ML IV EMUL
INTRAVENOUS | Status: AC
  Filled 2019-06-10: qty 100

## 2019-06-10 MED ORDER — 0.9 % SODIUM CHLORIDE (POUR BTL) OPTIME
TOPICAL | Status: DC | PRN
  Administered 2019-06-10 (×2): 1000 mL

## 2019-06-10 MED ORDER — ALUM & MAG HYDROXIDE-SIMETH 200-200-20 MG/5ML PO SUSP: 30.0000 mL | Freq: Four times a day (QID) | ORAL | Status: DC | PRN

## 2019-06-10 MED ORDER — LIDOCAINE-EPINEPHRINE 1 %-1:100000 IJ SOLN
INTRAMUSCULAR | Status: AC
  Filled 2019-06-10: qty 1

## 2019-06-10 MED ORDER — ONDANSETRON HCL 4 MG PO TABS: 4.0000 mg | ORAL_TABLET | Freq: Four times a day (QID) | ORAL | Status: DC | PRN

## 2019-06-10 MED ORDER — THROMBIN 5000 UNITS EX SOLR
OROMUCOSAL | Status: DC | PRN
  Administered 2019-06-10 (×2): 5 mL via TOPICAL

## 2019-06-10 MED ORDER — ONDANSETRON HCL 4 MG/2ML IJ SOLN: 4.0000 mg | Freq: Four times a day (QID) | INTRAMUSCULAR | Status: DC | PRN

## 2019-06-10 MED ORDER — METFORMIN HCL 500 MG PO TABS
500.0000 mg | ORAL_TABLET | Freq: Two times a day (BID) | ORAL | Status: DC
  Administered 2019-06-11: 09:00:00 500 mg via ORAL
  Filled 2019-06-10: qty 1

## 2019-06-10 MED ORDER — PANTOPRAZOLE SODIUM 40 MG PO TBEC
40.0000 mg | DELAYED_RELEASE_TABLET | Freq: Every day | ORAL | Status: DC
  Administered 2019-06-11: 08:00:00 40 mg via ORAL
  Filled 2019-06-10: qty 1

## 2019-06-10 MED ORDER — OXYCODONE HCL 5 MG PO TABS
5.0000 mg | ORAL_TABLET | ORAL | Status: DC | PRN
  Administered 2019-06-10: 19:00:00 5 mg via ORAL

## 2019-06-10 MED ORDER — OXYCODONE HCL 5 MG PO TABS
ORAL_TABLET | ORAL | Status: AC
  Filled 2019-06-10: qty 1

## 2019-06-10 MED ORDER — GABAPENTIN 300 MG PO CAPS
300.0000 mg | ORAL_CAPSULE | Freq: Three times a day (TID) | ORAL | Status: DC
  Administered 2019-06-10 – 2019-06-11 (×2): 300 mg via ORAL
  Filled 2019-06-10: qty 1
  Filled 2019-06-10: qty 3

## 2019-06-10 MED ORDER — FUROSEMIDE 20 MG PO TABS
20.0000 mg | ORAL_TABLET | Freq: Every day | ORAL | Status: DC
  Administered 2019-06-11: 09:00:00 20 mg via ORAL
  Filled 2019-06-10: qty 1

## 2019-06-10 MED ORDER — SODIUM CHLORIDE 0.9% FLUSH: 3.0000 mL | Freq: Two times a day (BID) | INTRAVENOUS | Status: DC

## 2019-06-10 MED ORDER — MIDAZOLAM HCL 2 MG/2ML IJ SOLN
INTRAMUSCULAR | Status: DC | PRN
  Administered 2019-06-10: 2 mg via INTRAVENOUS

## 2019-06-10 MED ORDER — CEFAZOLIN SODIUM 1 G IJ SOLR
INTRAMUSCULAR | Status: AC
  Filled 2019-06-10: qty 30

## 2019-06-10 MED ORDER — HYDROCODONE-ACETAMINOPHEN 5-325 MG PO TABS: 2.0000 | ORAL_TABLET | ORAL | Status: DC | PRN

## 2019-06-10 MED ORDER — ZOLPIDEM TARTRATE 5 MG PO TABS: 5.0000 mg | ORAL_TABLET | Freq: Every evening | ORAL | Status: DC | PRN

## 2019-06-10 MED ORDER — LIDOCAINE-EPINEPHRINE 1 %-1:100000 IJ SOLN
INTRAMUSCULAR | Status: DC | PRN
  Administered 2019-06-10: 10 mL
  Administered 2019-06-10: 9 mL

## 2019-06-10 MED ORDER — ASCORBIC ACID 500 MG PO TABS
500.0000 mg | ORAL_TABLET | Freq: Every day | ORAL | Status: DC
  Administered 2019-06-11: 09:00:00 500 mg via ORAL
  Filled 2019-06-10: qty 1

## 2019-06-10 MED ORDER — MIDAZOLAM HCL 2 MG/2ML IJ SOLN
INTRAMUSCULAR | Status: AC
  Filled 2019-06-10: qty 2

## 2019-06-10 MED ORDER — FENTANYL CITRATE (PF) 100 MCG/2ML IJ SOLN
25.0000 ug | INTRAMUSCULAR | Status: DC | PRN
  Administered 2019-06-10: 17:00:00 50 ug via INTRAVENOUS
  Administered 2019-06-10: 18:00:00 25 ug via INTRAVENOUS

## 2019-06-10 MED ORDER — HYDROMORPHONE HCL 1 MG/ML IJ SOLN
0.2500 mg | INTRAMUSCULAR | Status: DC | PRN
  Administered 2019-06-10: 0.25 mg via INTRAVENOUS
  Administered 2019-06-10: 18:00:00 0.5 mg via INTRAVENOUS

## 2019-06-10 MED ORDER — PANTOPRAZOLE SODIUM 40 MG IV SOLR: 40.0000 mg | Freq: Every day | INTRAVENOUS | Status: DC

## 2019-06-10 MED ORDER — EPHEDRINE 5 MG/ML INJ
INTRAVENOUS | Status: AC
  Filled 2019-06-10: qty 10

## 2019-06-10 MED ORDER — PROPOFOL 10 MG/ML IV BOLUS
INTRAVENOUS | Status: DC | PRN
  Administered 2019-06-10: 200 mg via INTRAVENOUS

## 2019-06-10 MED ORDER — SUCCINYLCHOLINE CHLORIDE 200 MG/10ML IV SOSY
PREFILLED_SYRINGE | INTRAVENOUS | Status: DC | PRN
  Administered 2019-06-10: 150 mg via INTRAVENOUS

## 2019-06-10 MED ORDER — ACETAMINOPHEN 325 MG PO TABS
650.0000 mg | ORAL_TABLET | ORAL | Status: DC | PRN
  Administered 2019-06-10: 22:00:00 650 mg via ORAL
  Filled 2019-06-10: qty 2

## 2019-06-10 MED ORDER — KETOROLAC TROMETHAMINE 30 MG/ML IJ SOLN
INTRAMUSCULAR | Status: AC
  Filled 2019-06-10: qty 1

## 2019-06-10 MED ORDER — FENTANYL CITRATE (PF) 100 MCG/2ML IJ SOLN
INTRAMUSCULAR | Status: AC
  Filled 2019-06-10: qty 2

## 2019-06-10 MED ORDER — LIDOCAINE 2% (20 MG/ML) 5 ML SYRINGE
INTRAMUSCULAR | Status: DC | PRN
  Administered 2019-06-10: 100 mg via INTRAVENOUS

## 2019-06-10 MED ORDER — PHENYLEPHRINE HCL-NACL 10-0.9 MG/250ML-% IV SOLN
INTRAVENOUS | Status: DC | PRN
  Administered 2019-06-10: 75 ug/min via INTRAVENOUS
  Administered 2019-06-10: 25 ug/min via INTRAVENOUS

## 2019-06-10 MED ORDER — FENTANYL CITRATE (PF) 250 MCG/5ML IJ SOLN
INTRAMUSCULAR | Status: DC | PRN
  Administered 2019-06-10: 100 ug via INTRAVENOUS
  Administered 2019-06-10 (×4): 50 ug via INTRAVENOUS
  Administered 2019-06-10: 100 ug via INTRAVENOUS
  Administered 2019-06-10 (×2): 50 ug via INTRAVENOUS

## 2019-06-10 MED ORDER — ONDANSETRON HCL 4 MG/2ML IJ SOLN
INTRAMUSCULAR | Status: DC | PRN
  Administered 2019-06-10 (×2): 4 mg via INTRAVENOUS

## 2019-06-10 MED ORDER — PHENYLEPHRINE 40 MCG/ML (10ML) SYRINGE FOR IV PUSH (FOR BLOOD PRESSURE SUPPORT)
PREFILLED_SYRINGE | INTRAVENOUS | Status: AC
  Filled 2019-06-10: qty 10

## 2019-06-10 MED ORDER — SUCCINYLCHOLINE CHLORIDE 200 MG/10ML IV SOSY
PREFILLED_SYRINGE | INTRAVENOUS | Status: AC
  Filled 2019-06-10: qty 10

## 2019-06-10 MED ORDER — CEFAZOLIN SODIUM-DEXTROSE 2-4 GM/100ML-% IV SOLN
2.0000 g | Freq: Three times a day (TID) | INTRAVENOUS | Status: AC
  Administered 2019-06-10 – 2019-06-11 (×2): 2 g via INTRAVENOUS
  Filled 2019-06-10 (×2): qty 100

## 2019-06-10 MED ORDER — KCL IN DEXTROSE-NACL 20-5-0.45 MEQ/L-%-% IV SOLN: INTRAVENOUS | Status: DC

## 2019-06-10 MED ORDER — ACETAMINOPHEN 500 MG PO TABS
1000.0000 mg | ORAL_TABLET | Freq: Once | ORAL | Status: AC
  Administered 2019-06-10: 10:00:00 1000 mg via ORAL
  Filled 2019-06-10: qty 2

## 2019-06-10 MED ORDER — ONDANSETRON HCL 4 MG/2ML IJ SOLN
INTRAMUSCULAR | Status: AC
  Filled 2019-06-10: qty 8

## 2019-06-10 MED ORDER — KETOROLAC TROMETHAMINE 30 MG/ML IJ SOLN
30.0000 mg | Freq: Once | INTRAMUSCULAR | Status: AC
  Administered 2019-06-10: 18:00:00 30 mg via INTRAVENOUS

## 2019-06-10 MED ORDER — METHOCARBAMOL 500 MG PO TABS
500.0000 mg | ORAL_TABLET | Freq: Four times a day (QID) | ORAL | Status: DC | PRN
  Administered 2019-06-11: 07:00:00 500 mg via ORAL
  Filled 2019-06-10 (×2): qty 1

## 2019-06-10 MED ORDER — DULOXETINE HCL 30 MG PO CPEP
30.0000 mg | ORAL_CAPSULE | Freq: Every day | ORAL | Status: DC
  Administered 2019-06-11: 09:00:00 30 mg via ORAL
  Filled 2019-06-10: qty 1

## 2019-06-10 MED ORDER — LIDOCAINE 2% (20 MG/ML) 5 ML SYRINGE
INTRAMUSCULAR | Status: AC
  Filled 2019-06-10: qty 15

## 2019-06-10 MED ORDER — PHENOL 1.4 % MT LIQD: 1.0000 | OROMUCOSAL | Status: DC | PRN

## 2019-06-10 MED ORDER — OXYCODONE HCL 5 MG PO TABS
10.0000 mg | ORAL_TABLET | ORAL | Status: DC | PRN
  Administered 2019-06-10 – 2019-06-11 (×6): 10 mg via ORAL
  Filled 2019-06-10 (×6): qty 2

## 2019-06-10 MED ORDER — CLOBETASOL PROPIONATE 0.05 % EX CREA: 1.0000 "application " | TOPICAL_CREAM | Freq: Two times a day (BID) | CUTANEOUS | Status: DC | PRN

## 2019-06-10 MED ORDER — METHOCARBAMOL 500 MG PO TABS
500.0000 mg | ORAL_TABLET | Freq: Three times a day (TID) | ORAL | Status: DC | PRN
  Administered 2019-06-10: 19:00:00 500 mg via ORAL

## 2019-06-10 MED ORDER — CLONAZEPAM 0.5 MG PO TABS: 0.5000 mg | ORAL_TABLET | Freq: Two times a day (BID) | ORAL | Status: DC | PRN

## 2019-06-10 MED ORDER — BUPIVACAINE HCL (PF) 0.5 % IJ SOLN
INTRAMUSCULAR | Status: DC | PRN
  Administered 2019-06-10: 9 mL
  Administered 2019-06-10: 10 mL

## 2019-06-10 MED ORDER — VASOPRESSIN 20 UNIT/ML IV SOLN
INTRAVENOUS | Status: DC | PRN
  Administered 2019-06-10: 2 m[IU] via INTRAVENOUS

## 2019-06-10 MED ORDER — DEXAMETHASONE SODIUM PHOSPHATE 10 MG/ML IJ SOLN
INTRAMUSCULAR | Status: DC | PRN
  Administered 2019-06-10: 10 mg via INTRAVENOUS

## 2019-06-10 MED ORDER — DEXAMETHASONE SODIUM PHOSPHATE 10 MG/ML IJ SOLN
INTRAMUSCULAR | Status: AC
  Filled 2019-06-10: qty 3

## 2019-06-10 MED ORDER — METOPROLOL SUCCINATE ER 50 MG PO TB24
50.0000 mg | ORAL_TABLET | Freq: Two times a day (BID) | ORAL | Status: DC
  Administered 2019-06-11: 09:00:00 50 mg via ORAL
  Filled 2019-06-10: qty 1

## 2019-06-10 MED ORDER — PROPOFOL 10 MG/ML IV BOLUS
INTRAVENOUS | Status: AC
  Filled 2019-06-10: qty 20

## 2019-06-10 SURGICAL SUPPLY — 78 items
BLADE CLIPPER SURG (BLADE) ×6 IMPLANT
CAGE COROENT XL WIDE 16X22X60 (Cage) ×3 IMPLANT
CAGE MODULUS XLW 10X22X60 - 10 (Cage) ×3 IMPLANT
CARTRIDGE OIL MAESTRO DRILL (MISCELLANEOUS) IMPLANT
CLIP NEUROVISION LG (CLIP) ×3 IMPLANT
CONT SPEC 4OZ CLIKSEAL STRL BL (MISCELLANEOUS) ×6 IMPLANT
COVER BACK TABLE 24X17X13 BIG (DRAPES) IMPLANT
COVER BACK TABLE 60X90IN (DRAPES) ×6 IMPLANT
COVER WAND RF STERILE (DRAPES) ×6 IMPLANT
DECANTER SPIKE VIAL GLASS SM (MISCELLANEOUS) ×6 IMPLANT
DERMABOND ADVANCED (GAUZE/BANDAGES/DRESSINGS) ×6
DERMABOND ADVANCED .7 DNX12 (GAUZE/BANDAGES/DRESSINGS) ×3 IMPLANT
DIFFUSER DRILL AIR PNEUMATIC (MISCELLANEOUS) IMPLANT
DRAPE C-ARM 42X72 X-RAY (DRAPES) ×6 IMPLANT
DRAPE C-ARMOR (DRAPES) ×6 IMPLANT
DRAPE LAPAROTOMY 100X72X124 (DRAPES) ×6 IMPLANT
DRAPE POUCH INSTRU U-SHP 10X18 (DRAPES) ×3 IMPLANT
DRAPE SURG 17X23 STRL (DRAPES) ×3 IMPLANT
DRSG OPSITE POSTOP 3X4 (GAUZE/BANDAGES/DRESSINGS) ×3 IMPLANT
DRSG OPSITE POSTOP 4X6 (GAUZE/BANDAGES/DRESSINGS) ×3 IMPLANT
DRSG OPSITE POSTOP 4X8 (GAUZE/BANDAGES/DRESSINGS) ×6 IMPLANT
DURAPREP 26ML APPLICATOR (WOUND CARE) ×6 IMPLANT
ELECT REM PT RETURN 9FT ADLT (ELECTROSURGICAL) ×6
ELECTRODE REM PT RTRN 9FT ADLT (ELECTROSURGICAL) ×2 IMPLANT
GAUZE 4X4 16PLY RFD (DISPOSABLE) ×3 IMPLANT
GAUZE SPONGE 4X4 12PLY STRL (GAUZE/BANDAGES/DRESSINGS) IMPLANT
GLOVE BIO SURGEON STRL SZ8 (GLOVE) ×9 IMPLANT
GLOVE BIOGEL PI IND STRL 8 (GLOVE) ×2 IMPLANT
GLOVE BIOGEL PI IND STRL 8.5 (GLOVE) ×3 IMPLANT
GLOVE BIOGEL PI INDICATOR 8 (GLOVE) ×4
GLOVE BIOGEL PI INDICATOR 8.5 (GLOVE) ×6
GLOVE ECLIPSE 8.0 STRL XLNG CF (GLOVE) ×6 IMPLANT
GLOVE EXAM NITRILE XL STR (GLOVE) IMPLANT
GOWN STRL REUS W/ TWL LRG LVL3 (GOWN DISPOSABLE) IMPLANT
GOWN STRL REUS W/ TWL XL LVL3 (GOWN DISPOSABLE) ×2 IMPLANT
GOWN STRL REUS W/TWL 2XL LVL3 (GOWN DISPOSABLE) ×12 IMPLANT
GOWN STRL REUS W/TWL LRG LVL3 (GOWN DISPOSABLE)
GOWN STRL REUS W/TWL XL LVL3 (GOWN DISPOSABLE) ×4
GUIDEWIRE NITINOL BEVEL TIP (WIRE) ×24 IMPLANT
HEMOSTAT POWDER SURGIFOAM 1G (HEMOSTASIS) ×6 IMPLANT
KIT BASIN OR (CUSTOM PROCEDURE TRAY) ×6 IMPLANT
KIT DILATOR XLIF 5 (KITS) ×2 IMPLANT
KIT INFUSE X SMALL 1.4CC (Orthopedic Implant) ×3 IMPLANT
KIT POSITION SURG JACKSON T1 (MISCELLANEOUS) ×3 IMPLANT
KIT SURGICAL ACCESS MAXCESS 4 (KITS) ×3 IMPLANT
KIT TURNOVER KIT B (KITS) ×6 IMPLANT
KIT XLIF (KITS) ×1
MARKER SKIN DUAL TIP RULER LAB (MISCELLANEOUS) ×3 IMPLANT
MODULE NVM5 NEXT GEN EMG (NEEDLE) ×3 IMPLANT
MODULUS XLW 12X22X60MM 10 (Spine Construct) ×3 IMPLANT
NEEDLE HYPO 21X1.5 SAFETY (NEEDLE) ×3 IMPLANT
NEEDLE HYPO 25X1 1.5 SAFETY (NEEDLE) ×6 IMPLANT
NEEDLE I PASS (NEEDLE) ×3 IMPLANT
NS IRRIG 1000ML POUR BTL (IV SOLUTION) ×6 IMPLANT
OIL CARTRIDGE MAESTRO DRILL (MISCELLANEOUS)
PACK LAMINECTOMY NEURO (CUSTOM PROCEDURE TRAY) ×6 IMPLANT
PAD ARMBOARD 7.5X6 YLW CONV (MISCELLANEOUS) IMPLANT
PATTIES SURGICAL .5 X.5 (GAUZE/BANDAGES/DRESSINGS) IMPLANT
PATTIES SURGICAL .5 X1 (DISPOSABLE) IMPLANT
PATTIES SURGICAL 1X1 (DISPOSABLE) IMPLANT
PUTTY BONE ATTRAX 5CC STRIP (Putty) ×9 IMPLANT
ROD RELINE MAS LORD 5.5X110MM (Rod) ×3 IMPLANT
ROD RELINE MAS LORD 5.5X120MM (Rod) ×3 IMPLANT
SCREW LOCK RELINE 5.5 TULIP (Screw) ×24 IMPLANT
SCREW RELINE MAS RED 7.5X50MM (Screw) ×12 IMPLANT
SCREW RELINE RED 6.5X50MM POLY (Screw) ×12 IMPLANT
SPONGE LAP 4X18 RFD (DISPOSABLE) ×3 IMPLANT
STAPLER SKIN PROX WIDE 3.9 (STAPLE) ×6 IMPLANT
SUT VIC AB 1 CT1 18XBRD ANBCTR (SUTURE) ×4 IMPLANT
SUT VIC AB 1 CT1 8-18 (SUTURE) ×8
SUT VIC AB 2-0 CT1 18 (SUTURE) ×15 IMPLANT
SUT VIC AB 3-0 SH 8-18 (SUTURE) ×12 IMPLANT
SYR 20ML LL LF (SYRINGE) ×3 IMPLANT
SYR TB 1ML 25GX5/8 (SYRINGE) IMPLANT
TOWEL GREEN STERILE (TOWEL DISPOSABLE) ×6 IMPLANT
TOWEL GREEN STERILE FF (TOWEL DISPOSABLE) ×6 IMPLANT
TRAY FOLEY MTR SLVR 16FR STAT (SET/KITS/TRAYS/PACK) ×3 IMPLANT
WATER STERILE IRR 1000ML POUR (IV SOLUTION) ×6 IMPLANT

## 2019-06-10 NOTE — Progress Notes (Signed)
Patient is awake, confused, agitated on awakening from surgery, insisting on making 19 quarts of stew.  MAEW with good power.  Calmer after Versed.  Observe in PACU.

## 2019-06-10 NOTE — Op Note (Signed)
06/10/2019  4:37 PM  PATIENT:  Nathan Cross  68 y.o. male  PRE-OPERATIVE DIAGNOSIS:  Spondylolisthesis, Lumbar region, scoliosis, spinal stenosis, herniated lumbar disc, lumbago, radiculopathy  POST-OPERATIVE DIAGNOSIS:  Spondylolisthesis, Lumbar region, scoliosis, spinal stenosis, herniated lumbar disc, lumbago, radiculopathy  PROCEDURE:  Procedure(s): Lumbar one-two , Lumbar two-three, Lumbar three-four- Anterolateral decompression/fusion with percutaneous pedicle screw fixation (N/A) LUMBAR PERCUTANEOUS PEDICLE SCREW 3 LEVEL (N/A)   SURGEON:  Surgeon(s) and Role:    Erline Levine, MD - Primary    * Ashok Pall, MD - Assisting  PHYSICIAN ASSISTANT:   ASSISTANTS: Poteat, RN   ANESTHESIA:   general  EBL:  150 mL   BLOOD ADMINISTERED:none  DRAINS: none   LOCAL MEDICATIONS USED:  MARCAINE    and LIDOCAINE   SPECIMEN:  No Specimen  DISPOSITION OF SPECIMEN:  N/A  COUNTS:  YES  TOURNIQUET:  * No tourniquets in log *  DICTATION: Patient is a 68 year old with severe spondylosis, spondylolisthesis, stenosis and scoliosis with herniated lumbar disc of the lumbar spine. It was elected to take him to surgery for anterolateral decompression and posterior pedicle screw fixation L 12, L 23, L 34 levels.  Procedure: Patient was brought to the operating room and placed in a lateral decubitus position on the operative table and using orthogonally projected C-arm fluoroscopy the patient was placed so that the L 12,  L 23, and L 34 levels were visualized in AP and lateral plane. The patient was then taped into position. The table was flexed so as to expose the L 34 level.  Skin was marked along with a posterior finger dissection incision. His flank was then prepped and draped in usual sterile fashion and incisions were made sequentially at L 34,  L 23 and L 12 levels. Posterior finger dissection was made to enter the retroperitoneal space and then subsequently the probe was inserted into  the psoas muscle from the left side initially at the L 34 level. After mapping the neural elements were able to dock the probe per the midpoint of this vertebral level and without indications electrically of too close proximity to the neural tissues. Subsequently the self-retaining tractor was.after sequential dilators were utilized the shim was employed and the interspace was cleared of psoas muscle and then incised. A thorough discectomy was performed. Instruments were used to clear the interspace of disc material.An anterior entry with posterior trajectory was performed to avoid neural elements.   After thorough discectomy was performed and this was performed using AP and lateral fluoroscopy a 16 lordotic by 60 x 22 mm PEEK implant was packed with extra small BMP and Attrax. This was tamped into position using the slides and its position was confirmed on AP and lateral fluoroscopy. Subsequently exposure was performed at the L 23 level and similar dissection was performed with locking of the self-retaining retractor. At this level were able to place a 12 lordotic by 22 x 60 mm titanium implant packed in a similar fashion. At the L 12 level were able to place an 10 mm lordotic by 60 x 22 mm implant packed in a similar fashion. Hemostasis was assured the wounds were irrigated and closed with interrupted Vicryl sutures.  Sterile occlusive dressings were placed. Retractor times were:  L 34: 17 minutes;  L 23: 19 minutes; L 12: 19 minutes.   Patient was then turned into a prone position on the [place] table and using AP and lateral fluoroscopy throughout this portion of the procedure, pedicle screws  were placed using Reline Nuvasive cannulated percutaneous screws. 2 screws were placed at L 1 and L 2 and (6.5 x 50 mm) and 2 at L3 and L 4 (7.5 x 50). 120 mm rod was then affixed to the screw heads do a separate stab incision and locked down on the screws on the right and 110 mm rod on the left. All connections were  then torqued and the Towers were disassembled. The wounds were irrigated and then closed with 1, 2-0 and 3-0 Vicryl stitches. Sterile occlusive dressing was placed with Dermabond. Long-acting Marcaine was injected. The patient was then extubated in the operating room and taken to recovery in stable and satisfactory condition having tolerated her operation well. Counts were correct at the end of the case.  Pelvic Parameters:  Preop: PT 15; PI 35; LL-24; PI-LL 11; SVA 42  PLAN OF CARE: Admit to inpatient   PATIENT DISPOSITION:  PACU - hemodynamically stable.   Delay start of Pharmacological VTE agent (>24hrs) due to surgical blood loss or risk of bleeding: yes

## 2019-06-10 NOTE — Anesthesia Procedure Notes (Signed)
Procedure Name: Intubation Date/Time: 06/10/2019 11:38 AM Performed by: Janace Litten, CRNA Pre-anesthesia Checklist: Patient identified, Emergency Drugs available, Suction available and Patient being monitored Patient Re-evaluated:Patient Re-evaluated prior to induction Oxygen Delivery Method: Circle System Utilized Preoxygenation: Pre-oxygenation with 100% oxygen Induction Type: IV induction and Rapid sequence Laryngoscope Size: Mac and 4 Grade View: Grade I Tube type: Oral Tube size: 7.5 mm Number of attempts: 1 Airway Equipment and Method: Stylet Placement Confirmation: ETT inserted through vocal cords under direct vision,  positive ETCO2 and breath sounds checked- equal and bilateral Secured at: 23 cm Tube secured with: Tape Dental Injury: Teeth and Oropharynx as per pre-operative assessment

## 2019-06-10 NOTE — Transfer of Care (Signed)
Immediate Anesthesia Transfer of Care Note  Patient: Nathan Cross  Procedure(s) Performed: Lumbar one-two , Lumbar two-three, Lumbar three-four- Anterolateral decompression/fusion with percutaneous pedicle screw fixation (N/A ) LUMBAR PERCUTANEOUS PEDICLE SCREW 3 LEVEL (N/A )  Patient Location: PACU  Anesthesia Type:General  Level of Consciousness: drowsy and patient cooperative  Airway & Oxygen Therapy: Patient Spontanous Breathing  Post-op Assessment: Report given to RN and Post -op Vital signs reviewed and stable  Post vital signs: Reviewed and stable  Last Vitals:  Vitals Value Taken Time  BP 99/41 06/10/19 1627  Temp    Pulse 69 06/10/19 1628  Resp 19 06/10/19 1628  SpO2 96 % 06/10/19 1628  Vitals shown include unvalidated device data.  Last Pain:  Vitals:   06/10/19 1018  PainSc: 10-Worst pain ever      Patients Stated Pain Goal: 3 ([id number])  Complications: No apparent anesthesia complications

## 2019-06-10 NOTE — Interval H&P Note (Signed)
History and Physical Interval Note:  06/10/2019 11:13 AM  Nathan Cross  has presented today for surgery, with the diagnosis of Spondylolisthesis, Lumbar region.  The various methods of treatment have been discussed with the patient and family. After consideration of risks, benefits and other options for treatment, the patient has consented to  Procedure(s) with comments: Lumbar 1-2, Lumbar 2-3, Lumbar 3-4 Anterolateral decompression/fusion with percutaneous pedicle screw fixation (N/A) - Lumbar 1-2, Lumbar 2-3, Lumbar 3-4 Anterolateral decompression/fusion with percutaneous pedicle screw fixation LUMBAR PERCUTANEOUS PEDICLE SCREW 3 LEVEL (N/A) as a surgical intervention.  The patient's history has been reviewed, patient examined, no change in status, stable for surgery.  I have reviewed the patient's chart and labs.  Questions were answered to the patient's satisfaction.     Peggyann Shoals

## 2019-06-10 NOTE — Brief Op Note (Signed)
06/10/2019  4:37 PM  PATIENT:  Nathan Cross  68 y.o. male  PRE-OPERATIVE DIAGNOSIS:  Spondylolisthesis, Lumbar region, scoliosis, spinal stenosis, herniated lumbar disc, lumbago, radiculopathy  POST-OPERATIVE DIAGNOSIS:  Spondylolisthesis, Lumbar region, scoliosis, spinal stenosis, herniated lumbar disc, lumbago, radiculopathy  PROCEDURE:  Procedure(s): Lumbar one-two , Lumbar two-three, Lumbar three-four- Anterolateral decompression/fusion with percutaneous pedicle screw fixation (N/A) LUMBAR PERCUTANEOUS PEDICLE SCREW 3 LEVEL (N/A)   SURGEON:  Surgeon(s) and Role:    Erline Levine, MD - Primary    * Ashok Pall, MD - Assisting  PHYSICIAN ASSISTANT:   ASSISTANTS: Poteat, RN   ANESTHESIA:   general  EBL:  150 mL   BLOOD ADMINISTERED:none  DRAINS: none   LOCAL MEDICATIONS USED:  MARCAINE    and LIDOCAINE   SPECIMEN:  No Specimen  DISPOSITION OF SPECIMEN:  N/A  COUNTS:  YES  TOURNIQUET:  * No tourniquets in log *  DICTATION: Patient is a 68 year old with severe spondylosis, spondylolisthesis, stenosis and scoliosis with herniated lumbar disc of the lumbar spine. It was elected to take him to surgery for anterolateral decompression and posterior pedicle screw fixation L 12, L 23, L 34 levels.  Procedure: Patient was brought to the operating room and placed in a lateral decubitus position on the operative table and using orthogonally projected C-arm fluoroscopy the patient was placed so that the L 12,  L 23, and L 34 levels were visualized in AP and lateral plane. The patient was then taped into position. The table was flexed so as to expose the L 34 level.  Skin was marked along with a posterior finger dissection incision. His flank was then prepped and draped in usual sterile fashion and incisions were made sequentially at L 34,  L 23 and L 12 levels. Posterior finger dissection was made to enter the retroperitoneal space and then subsequently the probe was inserted into  the psoas muscle from the left side initially at the L 34 level. After mapping the neural elements were able to dock the probe per the midpoint of this vertebral level and without indications electrically of too close proximity to the neural tissues. Subsequently the self-retaining tractor was.after sequential dilators were utilized the shim was employed and the interspace was cleared of psoas muscle and then incised. A thorough discectomy was performed. Instruments were used to clear the interspace of disc material.An anterior entry with posterior trajectory was performed to avoid neural elements.   After thorough discectomy was performed and this was performed using AP and lateral fluoroscopy a 16 lordotic by 60 x 22 mm PEEK implant was packed with extra small BMP and Attrax. This was tamped into position using the slides and its position was confirmed on AP and lateral fluoroscopy. Subsequently exposure was performed at the L 23 level and similar dissection was performed with locking of the self-retaining retractor. At this level were able to place a 12 lordotic by 22 x 60 mm titanium implant packed in a similar fashion. At the L 12 level were able to place an 10 mm lordotic by 60 x 22 mm implant packed in a similar fashion. Hemostasis was assured the wounds were irrigated and closed with interrupted Vicryl sutures.  Sterile occlusive dressings were placed. Retractor times were:  L 34: 17 minutes;  L 23: 19 minutes; L 12: 19 minutes.   Patient was then turned into a prone position on the [place] table and using AP and lateral fluoroscopy throughout this portion of the procedure, pedicle screws  were placed using Reline Nuvasive cannulated percutaneous screws. 2 screws were placed at L 1 and L 2 and (6.5 x 50 mm) and 2 at L3 and L 4 (7.5 x 50). 120 mm rod was then affixed to the screw heads do a separate stab incision and locked down on the screws on the right and 110 mm rod on the left. All connections were  then torqued and the Towers were disassembled. The wounds were irrigated and then closed with 1, 2-0 and 3-0 Vicryl stitches. Sterile occlusive dressing was placed with Dermabond. Long-acting Marcaine was injected. The patient was then extubated in the operating room and taken to recovery in stable and satisfactory condition having tolerated her operation well. Counts were correct at the end of the case.  Pelvic Parameters:  Preop: PT 15; PI 35; LL-24; PI-LL 11; SVA 42  PLAN OF CARE: Admit to inpatient   PATIENT DISPOSITION:  PACU - hemodynamically stable.   Delay start of Pharmacological VTE agent (>24hrs) due to surgical blood loss or risk of bleeding: yes

## 2019-06-11 MED FILL — Gelatin Absorbable MT Powder: OROMUCOSAL | Qty: 1 | Status: AC

## 2019-06-11 MED FILL — Thrombin For Soln 5000 Unit: CUTANEOUS | Qty: 5000 | Status: AC

## 2019-06-11 NOTE — Anesthesia Postprocedure Evaluation (Signed)
Anesthesia Post Note  Patient: Nathan Cross  Procedure(s) Performed: Lumbar one-two , Lumbar two-three, Lumbar three-four- Anterolateral decompression/fusion with percutaneous pedicle screw fixation (N/A ) LUMBAR PERCUTANEOUS PEDICLE SCREW 3 LEVEL (N/A )     Patient location during evaluation: PACU Anesthesia Type: General Level of consciousness: awake and alert Pain management: pain level controlled Vital Signs Assessment: post-procedure vital signs reviewed and stable Respiratory status: spontaneous breathing, nonlabored ventilation and respiratory function stable Cardiovascular status: blood pressure returned to baseline and stable Postop Assessment: no apparent nausea or vomiting Anesthetic complications: no    Last Vitals:  Vitals:   06/11/19 0453 06/11/19 0731  BP: 121/69 137/71  Pulse: 65 64  Resp: 20 18  Temp: 36.5 C 36.5 C  SpO2: 99% 98%    Last Pain:  Vitals:   06/11/19 0731  TempSrc: Oral  PainSc:                  Catalina Gravel

## 2019-06-11 NOTE — Discharge Summary (Signed)
Physician Discharge Summary  Patient ID: Nathan Cross MRN: [id number] DOB/AGE: July 29, 1950 68 y.o.  Admit date: 06/10/2019 Discharge date: 06/11/2019  Admission Diagnoses:Spondylolisthesis, Lumbar region, scoliosis, spinal stenosis, herniated lumbar disc, lumbago, radiculopathy    Discharge Diagnoses: Spondylolisthesis, Lumbar region, scoliosis, spinal stenosis, herniated lumbar disc, lumbago, radiculopathy s/p Lumbar one-two , Lumbar two-three, Lumbar three-four- Anterolateral decompression/fusion with percutaneous pedicle screw fixation (N/A) LUMBAR PERCUTANEOUS PEDICLE SCREW 3 LEVEL (N/A)      Active Problems:   Scoliosis of lumbosacral spine   Discharged Condition: good  Hospital Course: Nathan Cross was admitted for surgery with dx spondylolisthesis and radiculopathy. Following uncomplicated surgery (as above) he recovered well and transferred to [facility] for nursing care and therapies. He is mobilizing well.  Consults: None  Significant Diagnostic Studies: radiology: X-Ray: intra-op  Treatments: surgery: Lumbar one-two , Lumbar two-three, Lumbar three-four- Anterolateral decompression/fusion with percutaneous pedicle screw fixation (N/A) LUMBAR PERCUTANEOUS PEDICLE SCREW 3 LEVEL (N/A)     Discharge Exam: Blood pressure 137/71, pulse 64, temperature 97.7 F (36.5 C), temperature source Oral, resp. rate 18, height 6\' 3"  (1.905 m), weight 117.9 kg, SpO2 98 %. Alert, conversant. Reports no leg pain or numbness. Incisional soreness. Incisions without eryuthema, swelling or drianage beneath honeycomb and Dermabond left side and lumbar. Good strength BLE.    Disposition: Discharge to home.  Rx's for Oxycodone 10mg  and Robaxin 500mg  will be eRx'ed. Pt should continue 300mg  po tid (has at home). He verbalizes understanding of d/c instructions and agrees to call office for 3-4 wk f/u appt.    Allergies as of 06/11/2019   No Known Allergies     Medication  List    STOP taking these medications   naproxen sodium 220 MG tablet Commonly known as: ALEVE   rivaroxaban 20 MG Tabs tablet Commonly known as: XARELTO     TAKE these medications   amiodarone 200 MG tablet Commonly known as: PACERONE Take 100 mg by mouth daily.   clobetasol cream 0.05 % Commonly known as: TEMOVATE Apply 1 application topically 2 (two) times daily as needed for irritation.   clonazePAM 0.5 MG tablet Commonly known as: KLONOPIN Take 0.5 mg by mouth 2 (two) times daily as needed for anxiety.   docusate sodium 100 MG capsule Commonly known as: COLACE Take 200 mg by mouth daily.   DULoxetine 30 MG capsule Commonly known as: CYMBALTA Take 30 mg by mouth daily.   furosemide 20 MG tablet Commonly known as: LASIX Take 20 mg by mouth daily.   gabapentin 300 MG capsule Commonly known as: NEURONTIN Take 300 mg by mouth 3 (three) times daily.   gemfibrozil 600 MG tablet Commonly known as: LOPID Take 600 mg by mouth 2 (two) times daily before a meal.   HYDROcodone-acetaminophen 5-325 MG tablet Commonly known as: NORCO/VICODIN Take 1 tablet by mouth every 6 (six) hours as needed for moderate pain.   levothyroxine 50 MCG tablet Commonly known as: SYNTHROID Take 50 mcg by mouth daily before breakfast.   lisinopril 5 MG tablet Commonly known as: ZESTRIL Take 5 mg by mouth daily.   metFORMIN 500 MG tablet Commonly known as: GLUCOPHAGE Take 500 mg by mouth 2 (two) times daily with a meal.   methocarbamol 500 MG tablet Commonly known as: ROBAXIN Take 500 mg by mouth 3 (three) times daily as needed for muscle spasms.   metoprolol succinate 50 MG 24 hr tablet Commonly known as: TOPROL-XL Take 50 mg by mouth 2 (two) times daily. Take with or immediately  following a meal.   omeprazole 20 MG capsule Commonly known as: PRILOSEC Take 40 mg by mouth daily.   rosuvastatin 20 MG tablet Commonly known as: CRESTOR Take 20 mg by mouth daily.   Vascepa 1 g  capsule Generic drug: icosapent Ethyl Take 1 g by mouth daily.   vitamin C 500 MG tablet Commonly known as: ASCORBIC ACID Take 500 mg by mouth daily.      Follow-up Information    Erline Levine, MD Follow up.   Specialty: Neurosurgery Contact information: [address] [place] [address] [phone]           Signed: Peggyann Shoals, MD 06/11/2019, 8:53 AM

## 2019-06-11 NOTE — Progress Notes (Addendum)
Subjective: Patient reports "All that leg pain is gone. I'm just sore at the incisions"  Objective: Vital signs in last 24 hours: Temp:  [97.7 F (36.5 C)-98.4 F (36.9 C)] 97.7 F (36.5 C) (12/16 0731) Pulse Rate:  [61-98] 64 (12/16 0731) Resp:  [12-24] 18 (12/16 0731) BP: (99-146)/(41-117) 137/71 (12/16 0731) SpO2:  [92 %-100 %] 98 % (12/16 0731) Weight:  [117.9 kg] 117.9 kg (12/15 0940)  Intake/Output from previous day: 12/15 0701 - 12/16 0700 In: 1850 [I.V.:1800; IV Piggyback:50] Out: 1625 [id number]; Blood:150] Intake/Output this shift: No intake/output data recorded.  Alert, conversant. Reports no leg pain or numbness. Incisional soreness. Incisions without eryuthema, swelling or drianage beneath honeycomb and Dermabond left side and lumbar. Good strength BLE.   Lab Results: No results for input(s): WBC, HGB, HCT, PLT in the last 72 hours. BMET No results for input(s): NA, K, CL, CO2, GLUCOSE, BUN, CREATININE, CALCIUM in the last 72 hours.  Studies/Results: DG Lumbar Spine 2-3 Views  Result Date: 06/10/2019 CLINICAL DATA:  L1-L4 posterior fusion EXAM: LUMBAR SPINE - 2-3 VIEW; DG C-ARM 1-60 MIN COMPARISON:  Lumbar spine radiographs-04/16/2019 FLUOROSCOPY TIME:  6 minutes, 33 second FINDINGS: 7 spot intraoperative fluoroscopic images of the lower lumbar spine are provided for review. Spinal labeling is difficult secondary to exclusion of the lumbosacral junction. Images demonstrate the sequela of 4 consecutive levels of paraspinal fusion and intervertebral disc space replacement, presumably L1 through L4 as provided in the indication for the examination. Alignment appears anatomic with restoration of the intervertebral disc space levels. No evidence of hardware failure or loosening. Surgical clips overlie right-side of the retroperitoneum. No radiopaque foreign body. IMPRESSION: Post presumed L1-L4 paraspinal fusion intervertebral disc space replacement without evidence of  hardware failure or loosening. Electronically Signed   By: Sandi Mariscal M.D.   On: 06/10/2019 16:28   DG C-Arm 1-60 Min  Result Date: 06/10/2019 CLINICAL DATA:  L1-L4 posterior fusion EXAM: LUMBAR SPINE - 2-3 VIEW; DG C-ARM 1-60 MIN COMPARISON:  Lumbar spine radiographs-04/16/2019 FLUOROSCOPY TIME:  6 minutes, 33 second FINDINGS: 7 spot intraoperative fluoroscopic images of the lower lumbar spine are provided for review. Spinal labeling is difficult secondary to exclusion of the lumbosacral junction. Images demonstrate the sequela of 4 consecutive levels of paraspinal fusion and intervertebral disc space replacement, presumably L1 through L4 as provided in the indication for the examination. Alignment appears anatomic with restoration of the intervertebral disc space levels. No evidence of hardware failure or loosening. Surgical clips overlie right-side of the retroperitoneum. No radiopaque foreign body. IMPRESSION: Post presumed L1-L4 paraspinal fusion intervertebral disc space replacement without evidence of hardware failure or loosening. Electronically Signed   By: Sandi Mariscal M.D.   On: 06/10/2019 16:28    Assessment/Plan: Improving  LOS: 1 day  Ok per DrStern to d/c to home. Rx's for Oxycodone 10mg  and Robaxin 500mg   will be eRx'ed. Pt should continue 300mg  po tid (has at home). He verbalizes understanding of d/c instructions and agrees to call office for 3-4 wk f/u appt.   Verdis Prime 06/11/2019, 7:50 AM   Patient is doing well.  Discharge home.

## 2019-06-11 NOTE — Plan of Care (Signed)
Patient alert and oriented, mae's well, voiding adequate amount of urine, swallowing without difficulty, c/o soreness at time of discharge. Patient discharged home with family. Script and discharged instructions given to patient. Patient and family stated understanding of instructions given. Patient has an appointment with Dr. Vertell Limber

## 2019-06-11 NOTE — Evaluation (Signed)
Physical Therapy Evaluation and Discharge Patient Details Name: Nathan Cross MRN: [id number] DOB: 10-Apr-1951 Today's Date: 06/11/2019   History of Present Illness  Pt is a 68 yo male s/p L1-2, L2-3, L3-4 XL IF. PMHx: DMT2, micrdiscectomy, HTN, DMT2, and MI.  Clinical Impression  Patient evaluated by Physical Therapy with no further acute PT needs identified. All education has been completed and the patient has no further questions. Pt was able to demonstrate transfers and ambulation with gross supervision for safety and rolling walker or hand rail in hall for support. Pt was educated on precautions, brace application/wearing schedule, appropriate activity progression, and car transfer. See below for any follow-up Physical Therapy or equipment needs. PT is signing off. Thank you for this referral.     Follow Up Recommendations No PT follow up;Supervision for mobility/OOB    Equipment Recommendations  None recommended by PT    Recommendations for Other Services       Precautions / Restrictions Precautions Precautions: Back Precaution Booklet Issued: Yes (comment) Precaution Comments: Handout present in room. Pt was cued for precautions during functional mobility.  Required Braces or Orthoses: Spinal Brace Spinal Brace: Lumbar corset;Applied in sitting position Restrictions Weight Bearing Restrictions: No      Mobility  Bed Mobility Overal bed mobility: Needs Assistance Bed Mobility: Rolling;Sit to Sidelying Rolling: Modified independent (Device/Increase time)     Sit to sidelying: Supervision General bed mobility comments: Supervision for optimal log roll technique. With cues was able to perform well without assistance.   Transfers Overall transfer level: Needs assistance Equipment used: Rolling walker (2 wheeled) Transfers: Sit to/from Stand Sit to Stand: Supervision         General transfer comment: VC's for improved posture. Power-up to full stand appeared effortful  and labored. Once standing, pt appeared steady.   Ambulation/Gait Ambulation/Gait assistance: Supervision;Min guard Gait Distance (Feet): 350 Feet Assistive device: Rolling walker (2 wheeled)(Handrail in hall) Gait Pattern/deviations: Step-through pattern;Decreased stride length;Trunk flexed Gait velocity: Decreased Gait velocity interpretation: <1.8 ft/sec, indicate of risk for recurrent falls General Gait Details: Pt reports feeling more comfortable with the RW use, however did not like the walker we had in the room for him (he is used to the rollator). Pt transitioned from RW to railing in hall for support. Grossly required supervision for safety with occasional min guard.   Stairs            Wheelchair Mobility    Modified Rankin (Stroke Patients Only)       Balance Overall balance assessment: Mild deficits observed, not formally tested                                           Pertinent Vitals/Pain Pain Assessment: Faces Pain Score: 4  Faces Pain Scale: Hurts little more Pain Location: low back/incision site Pain Descriptors / Indicators: Discomfort;Grimacing Pain Intervention(s): Limited activity within patient's tolerance;Monitored during session;Repositioned    Home Living Family/patient expects to be discharged to:: Private residence Living Arrangements: Spouse/significant other;Children Available Help at Discharge: Family;Available 24 hours/day Type of Home: House Home Access: Stairs to enter   [employer] of Steps: 1 Home Layout: One level Home Equipment: Walker - 4 wheels;Bedside commode;Grab bars - tub/shower      Prior Function Level of Independence: Needs assistance   Gait / Transfers Assistance Needed: Mobility with rollator  ADL's / Homemaking 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; pt was requiring assist for bed mobility/LB dressing/bathing due to severe pain        Hand Dominance   Dominant Hand:  Right    Extremity/Trunk Assessment   Upper Extremity Assessment Upper Extremity Assessment: Defer to OT evaluation    Lower Extremity Assessment Lower Extremity Assessment: Generalized weakness(Consistent with pre-op diagnosis)    Cervical / Trunk Assessment Cervical / Trunk Assessment: Other exceptions Cervical / Trunk Exceptions: s/p multi lvel lumbar sx  Communication   Communication: No difficulties  Cognition Arousal/Alertness: Awake/alert Behavior During Therapy: WFL for tasks assessed/performed Overall Cognitive Status: Within Functional Limits for tasks assessed                                        General Comments      Exercises     Assessment/Plan    PT Assessment Patent does not need any further PT services  PT Problem List         PT Treatment Interventions      PT Goals (Current goals can be found in the Care Plan section)  Acute Rehab PT Goals Patient Stated Goal: to go home  PT Goal Formulation: All assessment and education complete, DC therapy    Frequency     Barriers to discharge        Co-evaluation               AM-PAC PT "6 Clicks" Mobility  Outcome Measure Help needed turning from your back to your side while in a flat bed without using bedrails?: None Help needed moving from lying on your back to sitting on the side of a flat bed without using bedrails?: None Help needed moving to and from a bed to a chair (including a wheelchair)?: None Help needed standing up from a chair using your arms (e.g., wheelchair or bedside chair)?: None Help needed to walk in hospital room?: None Help needed climbing 3-5 steps with a railing? : A Little 6 Click Score: 23    End of Session Equipment Utilized During Treatment: Back brace Activity Tolerance: Patient tolerated treatment well Patient left: in bed;with call bell/phone within reach Nurse Communication: Mobility status PT Visit Diagnosis: Unsteadiness on feet  (R26.81);Pain Pain - part of body: (back)    Time: [id number] PT Time Calculation (min) (ACUTE ONLY): 27 min   Charges:   PT Evaluation $PT Eval Moderate Complexity: 1 Mod PT Treatments $Gait Training: 8-22 mins        Rolinda Roan, PT, DPT Acute Rehabilitation Services Pager: [phone] Office: [phone]   Thelma Comp 06/11/2019, 11:47 AM

## 2019-06-11 NOTE — Progress Notes (Signed)
Orthopedic Tech Progress Note Patient Details:  Nathan Cross Oct 31, 1950 [id number] RN said patient has brace Patient ID: Nathan Cross, male   DOB: January 24, 1951, 69 y.o.   MRN: [id number]   Janit Pagan 06/11/2019, 7:32 AM

## 2019-06-11 NOTE — Discharge Instructions (Signed)
Wound Care Leave incision open to air. You may shower. Do not scrub directly on incision.  Do not put any creams, lotions, or ointments on incision. Activity Walk each and every day, increasing distance each day. No lifting greater than 5 lbs.  Avoid bending, arching, and twisting. No driving for 2 weeks; may ride as a passenger locally. If provided with back brace, wear when out of bed.  It is not necessary to wear in bed. Diet Resume your normal diet.  Return to Work   [employer] [place] Will be discussed at you follow up appointment. Call Your Doctor If Any of These Occur Redness, drainage, or swelling at the wound.  Temperature greater than 101 degrees. Severe pain not relieved by pain medication. Incision starts to come apart. Follow Up Appt Call today for appointment in 3-4 weeks [id number]) or for problems.  If you have any hardware placed in your spine, you will need an x-ray before your appointment.

## 2019-06-11 NOTE — Evaluation (Signed)
Occupational Therapy Evaluation Patient Details Name: Nathan Cross MRN: [id number] DOB: 09-Jan-1951 Today's Date: 06/11/2019    History of Present Illness Pt is a 68 yo male s/p L1-2, L2-3, L3-4 XL IF. PMHx: DMT2, micrdiscectomy, HTN, DMT2, and MI.   Clinical Impression   Pt PTA: Pt living with spouse and daughter and requiring AD for mobility and assist for LB needs. Pt currently, pt performing lower body dressing with hip hike technique and some assist to start pants. Pt will require assist for socks, but pt uninterested in using hip kit . Pt education provided on hip kit and availability just in case. Pt advised to try lying down and bringing legs to him to dress. Pt donning/doffing brace well. Pt given 2 cups for rinsing/spitting at sink and pt required no cues to avoid bending, lifting or twisting. Pt verbally went over stepping in/out of tub shower using grab bars. Back handout provided and reviewed ADL in detail. Pt educated on: clothing between brace, never sleep in brace, set an alarm at night for medication, avoid sitting for long periods of time, correct bed positioning for sleeping, correct sequence for bed mobility, avoiding lifting more than 5 pounds and never wash directly over incision. All education is complete and patient indicates understanding.Pt doing well and does not require continued OT skilled services. OT signing off.    Follow Up Recommendations  No OT follow up    Equipment Recommendations  None recommended by OT    Recommendations for Other Services       Precautions / Restrictions Precautions Precautions: Back Precaution Booklet Issued: Yes (comment) Precaution Comments: verbal discussion of back precautions/handout Required Braces or Orthoses: Spinal Brace Spinal Brace: Thoracolumbosacral orthotic Restrictions Weight Bearing Restrictions: No      Mobility Bed Mobility Overal bed mobility: Needs Assistance Bed Mobility: Rolling;Sidelying to  Sit Rolling: Supervision Sidelying to sit: Supervision       General bed mobility comments: rail use  Transfers Overall transfer level: Needs assistance Equipment used: Rolling walker (2 wheeled) Transfers: Sit to/from Stand Sit to Stand: Supervision              Balance Overall balance assessment: Mild deficits observed, not formally tested                                         ADL either performed or assessed with clinical judgement   ADL Overall ADL's : At baseline                                       General ADL Comments: Pt able to perform lower body dressing with hip hike technique and some assist to start pants. Pt will require assist for socks, but pt uninterested in hip kit education. Pt education provided on hip kit and availability just in case. Pt advised to try lying down and bringing legs to him to dress. Pt donning/doffing brace well. Pt given 2 cups for rinsing/spitting at sink and pt required no cues to avoid bending, lifting or twisting.      Vision Baseline Vision/History: No visual deficits Patient Visual Report: No change from baseline Vision Assessment?: No apparent visual deficits     Perception     Praxis      Pertinent Vitals/Pain Pain Assessment: 0-10 Pain Score: 4  Pain Location: low back Pain Descriptors / Indicators: Discomfort;Grimacing Pain Intervention(s): Monitored during session;Premedicated before session     Hand Dominance Right   Extremity/Trunk Assessment Upper Extremity Assessment Upper Extremity Assessment: Generalized weakness   Lower Extremity Assessment Lower Extremity Assessment: Defer to PT evaluation;Generalized weakness   Cervical / Trunk Assessment Cervical / Trunk Assessment: Other exceptions Cervical / Trunk Exceptions: s/p multi lvel lumbar sx   Communication Communication Communication: No difficulties   Cognition Arousal/Alertness: Awake/alert Behavior During  Therapy: WFL for tasks assessed/performed Overall Cognitive Status: Within Functional Limits for tasks assessed                                     General Comments       Exercises     Shoulder Instructions      Home Living Family/patient expects to be discharged to:: Private residence Living Arrangements: Spouse/significant other;Children Available Help at Discharge: Family;Available 24 hours/day Type of Home: House Home Access: Stairs to enter [employer] of Steps: 1   Home Layout: One level     Bathroom Shower/Tub: Teacher, early years/pre: Standard     Home Equipment: Environmental consultant - 4 wheels;Bedside commode;Grab bars - tub/shower          Prior Functioning/Environment Level of Independence: Needs assistance  Gait / Transfers Assistance Needed: Mobility with rollator ADL's / Homemaking 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; pt was requiring assist for LB dressing/bathing due to severe pain            OT Problem List: Decreased activity tolerance;Pain      OT Treatment/Interventions: Self-care/ADL training;Therapeutic exercise;Neuromuscular education;Energy conservation;DME and/or AE instruction;Therapeutic activities;Cognitive remediation/compensation;Patient/family education;Balance training    OT Goals(Current goals can be found in the care plan section) Acute Rehab OT Goals Patient Stated Goal: to go home  OT Goal Formulation: With patient Time For Goal Achievement: 06/25/19 Potential to Achieve Goals: Good  OT Frequency: Min 2X/week   Barriers to D/C:            Co-evaluation              AM-PAC OT "6 Clicks" Daily Activity     Outcome Measure Help from another person eating meals?: None Help from another person taking care of personal grooming?: None Help from another person toileting, which includes using toliet, bedpan, or urinal?: None Help from another person bathing (including washing, rinsing,  drying)?: A Little Help from another person to put on and taking off regular upper body clothing?: None Help from another person to put on and taking off regular lower body clothing?: A Little 6 Click Score: 22   End of Session Equipment Utilized During Treatment: Rolling walker;Back brace Nurse Communication: Mobility status  Activity Tolerance: Patient tolerated treatment well;Patient limited by pain Patient left: in chair;with call bell/phone within reach  OT Visit Diagnosis: Muscle weakness (generalized) (M62.81);Pain Pain - part of body: (back)                Time: 7017-7939 OT Time Calculation (min): 29 min Charges:  OT General Charges $OT Visit: 1 Visit OT Evaluation $OT Eval Moderate Complexity: 1 Mod OT Treatments $Self Care/Home Management : 8-22 mins  Ebony Hail Harold Hedge) Marsa Aris OTR/L Acute Rehabilitation Services Pager: [phone] Office: Benjamin Perez 06/11/2019, 9:32 AM

## 2019-06-12 ENCOUNTER — Encounter: Payer: Self-pay | Admitting: *Deleted

## 2019-06-26 MED FILL — Sodium Chloride IV Soln 0.9%: INTRAVENOUS | Qty: 1000 | Status: AC

## 2019-06-26 MED FILL — Heparin Sodium (Porcine) Inj 1000 Unit/ML: INTRAMUSCULAR | Qty: 30 | Status: AC

## 2019-11-03 DIAGNOSIS — K315 Obstruction of duodenum: Secondary | ICD-10-CM

## 2019-11-03 HISTORY — DX: Obstruction of duodenum: K31.5

## 2019-12-18 ENCOUNTER — Telehealth: Payer: Self-pay | Admitting: Internal Medicine

## 2019-12-18 NOTE — Telephone Encounter (Signed)
Dr. Hilarie Fredrickson   We received records for this pt from [place]. He would like to switch care.  Will you accept this pt?

## 2019-12-19 NOTE — Telephone Encounter (Signed)
Pt states he is having symptoms of belching with horrible smell and his previous doctor states (after endo /colon)   he may need to be stretched he states his doctor has been great however he has not really addressed  the problem of belching and he feels he would like to switch care to someone else. Pt states his wife is patient of yours and would like to transfer care since she is very happy with you.

## 2019-12-19 NOTE — Telephone Encounter (Signed)
Ok for appt  

## 2019-12-19 NOTE — Telephone Encounter (Signed)
Pt just had evaluation by GI in [place], [place] including EGD and colonoscopy in May and March 2021, respectively  Can you please clarify why he is requesting to transfer care. It appears the GI evaluation may be complete Thanks JMP

## 2019-12-23 NOTE — Telephone Encounter (Signed)
Records receivied back and placed in  file cabinet in schedulers office.

## 2019-12-30 ENCOUNTER — Encounter: Payer: Self-pay | Admitting: Nurse Practitioner

## 2020-01-26 ENCOUNTER — Encounter: Payer: Self-pay | Admitting: Gastroenterology

## 2020-01-29 ENCOUNTER — Ambulatory Visit (INDEPENDENT_AMBULATORY_CARE_PROVIDER_SITE_OTHER): Payer: Medicare Other | Admitting: Nurse Practitioner

## 2020-01-29 ENCOUNTER — Encounter: Payer: Self-pay | Admitting: Nurse Practitioner

## 2020-01-29 VITALS — BP 110/74 | HR 77 | Ht 75.0 in | Wt 263.0 lb

## 2020-01-29 DIAGNOSIS — R142 Eructation: Secondary | ICD-10-CM | POA: Diagnosis not present

## 2020-01-29 DIAGNOSIS — K5900 Constipation, unspecified: Secondary | ICD-10-CM | POA: Diagnosis not present

## 2020-01-29 DIAGNOSIS — K315 Obstruction of duodenum: Secondary | ICD-10-CM | POA: Diagnosis not present

## 2020-01-29 DIAGNOSIS — I4891 Unspecified atrial fibrillation: Secondary | ICD-10-CM

## 2020-01-29 NOTE — Progress Notes (Signed)
01/29/2020 Nathan Cross [id number] [birth year]   CHIEF COMPLAINT: Constipation and belching  HISTORY OF PRESENT ILLNESS: Nathan Cross is a 69 year old male with a past medical history of hypertension, coronary artery disease MI s/p DES x 1 in 2012,  s/p 4 CABG in 8938, chronic systolic CHF, paroxysmal atrial fibrillation on Xarelto, sleep apnea,  hypothyroidism, UGI bleed secondary to gastric ulcers while on dual antiplatelet therapy and Ibuprofen use 2012 and colon polyps. 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 Left eye surgery with prosthesis. Partial knee replacement. Hernia repair. Lower back surgery 10/2018 and lumbar fusion 05/2019. He was previously evaluated by Dr. Earley Brooke in [place]. He wishes to transition his GI management with Dr. Hilarie Fredrickson.   He presents today for further evaluation regarding belching and constipation which started after he took narcotic pain medications following his back surgery 05/2019. He developed constipation for 3 to 4 months following his back surgery, "felt like I was full of concrete". He had frequent foul tasting burps during this time as well. No N/V. He had abdominal bloat but no significant abdomina pain. He stopped taking Narcotic pain meds around the end of March or early April 2021. He intermittently took a laxative which resulted in diarrhea then he would take Imodium as needed. He was evaluated by Dr. Earley Brooke. He underwent a colonoscopy 09/03/2019 which showed moderate diverticulosis to the sigmoid colon, no polyps. The bowel prep was good, the procedure was somewhat difficult, pressure was applied to the LLQ to facilitate advancement  of the scope.  He underwent an EGD 11/03/2019 showed semisolid food in the stomach indicating gastroparesis. He reported eating his dinner meal around 6pm the night before then NPO, his EGD was done around 1pm the next day. The EGD also identified a benign intrinsic duodenal stricture and the  scope could not transverse the lesion. A tight stricture was seen at the apex of the duodenal bulb which was not dilated due to the large amount of retained food in the stomach. An UGI series was done 11/18/2019 which identified trace esophageal reflux without evidence of a stricture, the duodenal  Bulb and sweep were unremarkable.  For the past few weeks, his constipation resolved. He is passing 2 normal brown BMs daily and his burping has significantly improved. No dysphagia. He is taking Metamucil daily and Omeprazole 20mg  po bid.   In review of his cardiology consult notes by Dr.Jao at [facility], the patient developed an UGI bleed with mild hematemesis and melena while on dual antiplatelet therapy and Ibuprofen 800mg  bid. An EGD showed nonbleeding ulcers treated with Prilosec. A repeat EGD showed resolution of the ulcers. I was unable to access the EGD procedure reports in care everywhere.   He was seen by his [facility] cardiologist Dr. Alroy Dust yesterday and a Zio patch monitor was placed, to be removed in 1 week. ECHO in 2016 showed LV EF 50-55%. He denies having any chest pain, palpitations or SOB. No other complaints today.    Past Medical History:  Diagnosis Date  . Adenomatous colon polyp 2017  . Diverticulosis   . 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 Hypertension   . Hypothyroidism   . Myocardial infarction ([place])   . PAF (paroxysmal atrial fibrillation) ([place])   . Sleep apnea    Past Surgical History:  Procedure Laterality Date  . ANTERIOR LAT LUMBAR FUSION N/A 06/10/2019   Procedure: Lumbar one-two , Lumbar two-three, Lumbar three-four- Anterolateral decompression/fusion with  percutaneous pedicle screw fixation;  Surgeon: Erline Levine, MD;  Location: [facility];  Service: Neurosurgery;  Laterality: N/A;  . CORONARY ARTERY BYPASS GRAFT  2004  . HERNIA REPAIR    . LUMBAR PERCUTANEOUS PEDICLE SCREW 3 LEVEL N/A 06/10/2019   Procedure: LUMBAR PERCUTANEOUS PEDICLE SCREW 3 LEVEL;  Surgeon: Erline Levine, MD;  Location: [facility];  Service: Neurosurgery;  Laterality: N/A;  . MEDIAL PARTIAL KNEE REPLACEMENT Bilateral   . 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 Past smoker, smoked  Cigarettes 1ppd x 30 yrs, quid in 2004.  No alcohol use.   Family History: Father died age 64 spinal disease. Mother living 73. Brother fairyly healthy. Daughter with kidney disease, he donated his left kidney to his daughter in 28.   No Known Allergies    Outpatient Encounter Medications as of 01/29/2020  Medication Sig  . amiodarone (PACERONE) 200 MG tablet Take 100 mg by mouth daily.  . clobetasol cream (TEMOVATE) 7.25 % Apply 1 application topically 2 (two) times daily as needed for irritation.  . clonazePAM (KLONOPIN) 0.5 MG tablet Take 0.5 mg by mouth 2 (two) times daily as needed for anxiety.   . furosemide (LASIX) 20 MG tablet Take 20 mg by mouth daily.  Marland Kitchen gemfibrozil (LOPID) 600 MG tablet Take 600 mg by mouth 2 (two) times daily before a meal.  . icosapent Ethyl (VASCEPA) 1 g capsule Take 1 g by mouth daily.  Marland Kitchen levothyroxine (SYNTHROID) 50 MCG tablet Take 50 mcg by mouth daily before breakfast.  . lisinopril (ZESTRIL) 5 MG tablet Take 5 mg by mouth daily.  . metFORMIN (GLUCOPHAGE) 500 MG tablet Take 500 mg by mouth 2 (two) times daily with a meal.  . methocarbamol (ROBAXIN) 500 MG tablet Take 500 mg by mouth 3 (three) times daily as needed for muscle spasms.   . metoprolol succinate (TOPROL-XL) 50 MG 24 hr tablet Take 50 mg by mouth 2 (two) times daily. Take with or immediately following a meal.  . omeprazole (PRILOSEC) 20 MG capsule Take 40 mg by mouth daily.  . rosuvastatin (CRESTOR) 20 MG tablet Take 20 mg by mouth daily.  . vitamin C (ASCORBIC ACID) 500 MG tablet Take 500 mg by mouth daily.  . [DISCONTINUED] docusate sodium (COLACE) 100 MG capsule Take 200 mg by mouth daily.  . [DISCONTINUED] DULoxetine (CYMBALTA) 30 MG capsule Take 30 mg by  mouth daily.  . [DISCONTINUED] gabapentin (NEURONTIN) 300 MG capsule Take 300 mg by mouth 3 (three) times daily.  . [DISCONTINUED] HYDROcodone-acetaminophen (NORCO/VICODIN) 5-325 MG tablet Take 1 tablet by mouth every 6 (six) hours as needed for moderate pain.   No facility-administered encounter medications on file as of 01/29/2020.     REVIEW OF SYSTEMS:  Gen: + fatigue. Denies fever, sweats or chills. No weight loss.  CV: Denies chest pain, palpitations or edema. Resp: Denies cough, shortness of breath of hemoptysis.  GI:  See HPI. GU : Denies urinary burning, blood in urine, increased urinary frequency or incontinence. MS: + back pain.  Derm: Denies rash, itchiness, skin lesions or unhealing ulcers. Psych: Denies depression, anxiety, memory loss, suicidal ideation and confusion. Heme: Denies bruising, bleeding. Neuro:  Denies headaches, dizziness or paresthesias. Endo:  Denies any problems with DM, thyroid or adrenal function.    PHYSICAL EXAM: BP 110/74   Pulse 77   Ht 6\' 3"  (1.905 m)   Wt 263 lb (119.3 kg)  BMI 32.87 kg/m  General: Well developed 69 year old male in no acute distress. Head: Normocephalic and atraumatic. Eyes:  Sclerae non-icteric, conjunctive pink. Ears: Normal auditory acuity. Mouth: Dentition intact. No ulcers or lesions.  Neck: Supple, no lymphadenopathy or thyromegaly.  Lungs: Clear bilaterally to auscultation without wheezes, crackles or rhonchi. Heart: Regular rate and rhythm. No murmur, rub or gallop appreciated. Zio monitor intact.  Abdomen: Soft, nontender, non distended. No masses. No hepatosplenomegaly. Normoactive bowel sounds x 4 quadrants. RLQ scar intact. Diastasis recti. RLQ scar intact.  Rectal: Deferred.  Musculoskeletal: Symmetrical with no gross deformities. Skin: Warm and dry. No rash or lesions on visible extremities. Extremities: No edema. Neurological: Alert oriented x 4, no focal deficits.  Psychological:  Alert and  cooperative. Normal mood and affect.  ASSESSMENT AND PLAN:  14. 69 year old male with increased belching with associated constipation and abdominal bloat which occurred after taking narcotics s/p lumbar surgery 05/2019. Colonoscopy 08/2019 showed diverticulosis, no polyps. EGD 10/2019 showed retained semi solid food with a benign appearing duodenal stricture concerning for GOO vs gastroparesis (He was off narcotics for at least 4 weeks prior to his EGD).  UGI series 11/18/2019 without evidence of a duodenal stricture or GOO. His foul belching has significantly improved over the past 2 weeks after his constipation issues resolved.  -Repeat EGD to assess the duodenum which was not completely visualized due to retained food present at the time of his EGD 11/03/2019. Await Zio patch results and cardiology input prior to scheduling EGD -Consider gastric empty study if his symptoms recur -Continue Omeprazole 20mg  po bid  -Miralax PRN as tolerated -Patient to call our office if his symptoms recur -Request copy of most recent labs from PCP's office.   2. History of adenomatous colon polyps in 2017. Colonoscopy 09/03/2019, no polyps.  -Dr. Hilarie Fredrickson to verify colonoscopy recall date   3. CAD, MI, stent x 1, s/p CABG 2004, CHF  4. Paroxysmal atrial fibrillation on Xarelto, Zio patch monitor in process for the next week  -Patient to call our office after Zio patch monitor results received   5. History of UGI bleed secondary to gastric ulcers while on dual antiplatelet therapy + Ibuprofen 2012       CC:  Frances Maywood, FNP

## 2020-01-29 NOTE — Patient Instructions (Addendum)
If you are age 67 or older, your body mass index should be between 23-30. Your Body mass index is 32.87 kg/m. If this is out of the aforementioned range listed, please consider follow up with your Primary Care Provider.  If you are age 91 or younger, your body mass index should be between 19-25. Your Body mass index is 32.87 kg/m. If this is out of the aformentioned range listed, please consider follow up with your Primary Care Provider.    Call the office after you have received results to your Zio patch.  Take Miralax 1 capful mixed in 8 ounces of water at bed time for constipation as tolerated.   Due to recent changes in healthcare laws, you may see the results of your imaging and laboratory studies on MyChart before your provider has had a chance to review them.  We understand that in some cases there may be results that are confusing or concerning to you. Not all laboratory results come back in the same time frame and the provider may be waiting for multiple results in order to interpret others.  Please give [place] 48 hours in order for your provider to thoroughly review all the results before contacting the office for clarification of your results.   Thank you for choosing [facility] Noralyn Pick, CRNP

## 2020-01-31 DIAGNOSIS — K5900 Constipation, unspecified: Secondary | ICD-10-CM | POA: Insufficient documentation

## 2020-01-31 DIAGNOSIS — R142 Eructation: Secondary | ICD-10-CM | POA: Insufficient documentation

## 2020-02-03 NOTE — Progress Notes (Signed)
Addendum: Reviewed and agree with assessment and management plan. Cherrise Occhipinti M, MD  

## 2020-02-13 ENCOUNTER — Telehealth: Payer: Self-pay | Admitting: Nurse Practitioner

## 2020-02-13 NOTE — Telephone Encounter (Signed)
Patient has completed the cardiac study. He is ready to schedule his procedures.

## 2020-02-19 NOTE — Telephone Encounter (Signed)
Beth, please contact his [facility] cardiologist Dr. Alroy Dust, verify if ZIO patch test completed and if no significant findings is patient ok to proceed with EGD.  If cardiology reports all is good then scheduled only EGD. He is not due for a colonoscopy.  Let me know outcome. Thx

## 2020-02-24 ENCOUNTER — Other Ambulatory Visit: Payer: Self-pay

## 2020-02-24 NOTE — Telephone Encounter (Signed)
Called the office of Dr Delanna Notice in [place], [place] at [phone]. The testing has been done, but it has not resulted yet.  I have faxed a request for medical release to that office fax [phone].

## 2020-02-26 NOTE — Telephone Encounter (Signed)
Received fax from Dr Delanna Notice, pt is cleared for his EGD and should hold his Xarelto for 24 hours prior to his procedure.

## 2020-02-26 NOTE — Telephone Encounter (Signed)
Called the patient and he is aware that Dr Cammy Copa stated it is ok to schedule for an EGD and hold his

## 2020-02-26 NOTE — Telephone Encounter (Signed)
Super! Are you taking it from here? He was not scheduled in his visit that I can see. Thanks

## 2020-02-27 NOTE — Telephone Encounter (Signed)
Tracy, pls schedule EGD if not already done. Per notes below, pt was cleared by cardiology. Thx

## 2020-02-27 NOTE — Telephone Encounter (Signed)
The patient states his daughter is inpatient in hospital and is unable to schedule at this time. He will call back to schedule in a couple weeks. I will keep track of this patient and contact him regarding scheduling

## 2020-03-09 ENCOUNTER — Telehealth: Payer: Self-pay | Admitting: Nurse Practitioner

## 2020-03-09 NOTE — Telephone Encounter (Signed)
Pt is requesting a call back from a nurse to discuss the bloating he is still experiencing.

## 2020-03-09 NOTE — Telephone Encounter (Signed)
Drema Halon this one off your to do list.  He and I have gotten him scheduled. Thanks

## 2020-03-12 ENCOUNTER — Other Ambulatory Visit: Payer: Self-pay

## 2020-03-12 ENCOUNTER — Ambulatory Visit (AMBULATORY_SURGERY_CENTER): Payer: Medicare Other | Admitting: *Deleted

## 2020-03-12 VITALS — Ht 75.0 in | Wt 255.0 lb

## 2020-03-12 DIAGNOSIS — R142 Eructation: Secondary | ICD-10-CM

## 2020-03-12 DIAGNOSIS — R140 Abdominal distension (gaseous): Secondary | ICD-10-CM

## 2020-03-12 NOTE — Progress Notes (Signed)
Pt verified name, DOB, address and insurance during PV today. Pt mailed instruction packet to included paper to complete and mail back to [facility] with addressed and stamped envelope, Emmi video, copy of consent form to read and not return, and instructions. PV completed over the phone. Pt encouraged to call with questions or issues   cov vax 2   No egg or soy allergy known to patient  No issues with past sedation with any surgeries or procedures no intubation problems in the past  No FH of Malignant Hyperthermia No diet pills per patient No home 02 use per patient  No blood thinners per patient  Pt denies issues with constipation  No A fib or A flutter  EMMI video to pt or via [place] 19 guidelines implemented in PV today with Pt and RN   Due to the COVID-19 pandemic we are asking patients to follow these guidelines. Please only bring one care partner. Please be aware that your care partner may wait in the car in the parking lot or if they feel like they will be too hot to wait in the car, they may wait in the lobby on the 4th floor. All care partners are required to wear a mask the entire time (we do not have any that we can provide them), they need to practice social distancing, and we will do a Covid check for all patient's and care partners when you arrive. Also we will check their temperature and your temperature. If the care partner waits in their car they need to stay in the parking lot the entire time and we will call them on their cell phone when the patient is ready for discharge so they can bring the car to the front of the building. Also all patient's will need to wear a mask into building.

## 2020-03-25 ENCOUNTER — Encounter: Payer: Self-pay | Admitting: Internal Medicine

## 2020-03-25 ENCOUNTER — Other Ambulatory Visit: Payer: Self-pay

## 2020-03-25 ENCOUNTER — Ambulatory Visit (AMBULATORY_SURGERY_CENTER): Payer: Medicare Other | Admitting: Internal Medicine

## 2020-03-25 VITALS — BP 109/48 | HR 50 | Temp 98.0°F | Resp 20 | Ht 75.0 in | Wt 260.0 lb

## 2020-03-25 DIAGNOSIS — K269 Duodenal ulcer, unspecified as acute or chronic, without hemorrhage or perforation: Secondary | ICD-10-CM | POA: Diagnosis not present

## 2020-03-25 DIAGNOSIS — R140 Abdominal distension (gaseous): Secondary | ICD-10-CM | POA: Diagnosis not present

## 2020-03-25 DIAGNOSIS — R142 Eructation: Secondary | ICD-10-CM

## 2020-03-25 DIAGNOSIS — K3189 Other diseases of stomach and duodenum: Secondary | ICD-10-CM

## 2020-03-25 DIAGNOSIS — K2990 Gastroduodenitis, unspecified, without bleeding: Secondary | ICD-10-CM

## 2020-03-25 DIAGNOSIS — K315 Obstruction of duodenum: Secondary | ICD-10-CM

## 2020-03-25 DIAGNOSIS — K2970 Gastritis, unspecified, without bleeding: Secondary | ICD-10-CM | POA: Diagnosis not present

## 2020-03-25 DIAGNOSIS — K222 Esophageal obstruction: Secondary | ICD-10-CM | POA: Diagnosis not present

## 2020-03-25 MED ORDER — SODIUM CHLORIDE 0.9 % IV SOLN: 500.0000 mL | Freq: Once | INTRAVENOUS | Status: DC

## 2020-03-25 NOTE — Progress Notes (Signed)
VS taken by C.W. 

## 2020-03-25 NOTE — Patient Instructions (Addendum)
YOU HAD AN ENDOSCOPIC PROCEDURE TODAY AT [facility]:   Refer to the procedure report that was given to you for any specific questions about what was found during the examination.  If the procedure report does not answer your questions, please call your gastroenterologist to clarify.  If you requested that your care partner not be given the details of your procedure findings, then the procedure report has been included in a sealed envelope for you to review at your convenience later.  YOU SHOULD EXPECT: Some feelings of bloating in the abdomen. Passage of more gas than usual.  Walking can help get rid of the air that was put into your GI tract during the procedure and reduce the bloating. If you had a lower endoscopy (such as a colonoscopy or flexible sigmoidoscopy) you may notice spotting of blood in your stool or on the toilet paper. If you underwent a bowel prep for your procedure, you may not have a normal bowel movement for a few days.  Please Note:  You might notice some irritation and congestion in your nose or some drainage.  This is from the oxygen used during your procedure.  There is no need for concern and it should clear up in a day or so.  SYMPTOMS TO REPORT IMMEDIATELY:    Following upper endoscopy (EGD)  Vomiting of blood or coffee ground material  New chest pain or pain under the shoulder blades  Painful or persistently difficult swallowing  New shortness of breath  Fever of 100F or higher  Black, tarry-looking stools  For urgent or emergent issues, a gastroenterologist can be reached at any hour by calling [phone]. Do not use MyChart messaging for urgent concerns.    DIET:  We do recommend a small meal at first, but then you may proceed to your regular diet.  Drink plenty of fluids but you should avoid alcoholic beverages for 24 hours.  ACTIVITY:  You should plan to take it easy for the rest of today and you should NOT DRIVE or use heavy machinery  until tomorrow (because of the sedation medicines used during the test).    FOLLOW UP: Our staff will call the number listed on your records 48-72 hours following your procedure to check on you and address any questions or concerns that you may have regarding the information given to you following your procedure. If we do not reach you, we will leave a message.  We will attempt to reach you two times.  During this call, we will ask if you have developed any symptoms of COVID 19. If you develop any symptoms (ie: fever, flu-like symptoms, shortness of breath, cough etc.) before then, please call [phone].  If you test positive for Covid 19 in the 2 weeks post procedure, please call and report this information to [place].    If any biopsies were taken you will be contacted by phone or by letter within the next 1-3 weeks.  Please call us at [phone] if you have not heard about the biopsies in 3 weeks.    SIGNATURES/CONFIDENTIALITY: You and/or your care partner have signed paperwork which will be entered into your electronic medical record.  These signatures attest to the fact that that the information above on your After Visit Summary has been reviewed and is understood.  Full responsibility of the confidentiality of this discharge information lies with you and/or your care-partner.   NO ASPIRIN OR NSAIDS,RESUME XARELTO 03/26/20, RESUME   REMAINDER OF  MEDICATIONS. OFFICE WILL CONTACT YOU FOR REPEAT EGD/UGI. INFORMATION GIVEN ON GASTROPARESIS DIET.INFORMATION GIVEN ON GASTRITIS.

## 2020-03-25 NOTE — Progress Notes (Signed)
Called to room to assist during endoscopic procedure.  Patient ID and intended procedure confirmed with present staff. Received instructions for my participation in the procedure from the performing physician.  

## 2020-03-25 NOTE — Progress Notes (Signed)
Report to PACU, RN, vss, BBS= Clear.  

## 2020-03-25 NOTE — Op Note (Signed)
[name] Procedure Date: 03/25/2020 2:27 PM MRN: [id number] Endoscopist: Jerene Bears , MD Age: 69 Referring MD:  Date of Birth: 09/20/1950 Gender: Male Account #: [id number] Procedure:                Upper GI endoscopy Indications:              Suspected gastric outlet obstruction, Abdominal                            bloating, Eructation Medicines:                Monitored Anesthesia Care Procedure:                Pre-Anesthesia Assessment:                           - Prior to the procedure, a History and Physical                            was performed, and patient medications and                            allergies were reviewed. The patient's tolerance of                            previous anesthesia was also reviewed. The risks                            and benefits of the procedure and the sedation                            options and risks were discussed with the patient.                            All questions were answered, and informed consent                            was obtained. Prior Anticoagulants: The patient has                            taken Xarelto (rivaroxaban), last dose was 2 days                            prior to procedure. ASA Grade Assessment: II - A                            patient with mild systemic disease. After reviewing                            the risks and benefits, the patient was deemed in                            satisfactory condition to undergo the procedure.  After obtaining informed consent, the endoscope was                            passed under direct vision. Throughout the                            procedure, the patient's blood pressure, pulse, and                            oxygen saturations were monitored continuously. The                            Endoscope was introduced through the mouth, and                            advanced to the second part of  duodenum. The upper                            GI endoscopy was accomplished without difficulty.                            The patient tolerated the procedure well. Scope In: Scope Out: Findings:                 Normal mucosa was found in the entire esophagus.                           A non-obstructing Schatzki ring was found at the                            gastroesophageal junction.                           A small amount of food (residue) was found in the                            gastric body.                           Moderate inflammation characterized by congestion                            (edema) and erythema was found in the gastric body.                            Biopsies were taken with a cold forceps for                            histology and Helicobacter pylori testing.                           An acquired benign-appearing, intrinsic severe                            stenosis was found at the D1/D2 junction  and was                            non-traversed. This is inflammatory in appearance.                            This was biopsied with a cold forceps for histology                            and to disrupt the ring. There was food residue in                            the duodenal bulb which would not clear with                            suction via the endoscope. Complications:            No immediate complications. Estimated Blood Loss:     Estimated blood loss was minimal. Impression:               - Normal mucosa was found in the entire esophagus.                           - Non-obstructing Schatzki ring.                           - A small amount of food (residue) in the stomach.                           - Gastritis. Biopsied.                           - Acquired duodenal stenosis. Biopsied. This is the                            cause of intermittent abdominal                            bloating/distention, belching, vomiting. Recommendation:            - Patient has a contact number available for                            emergencies. The signs and symptoms of potential                            delayed complications were discussed with the                            patient. Return to normal activities tomorrow.                            Written discharge instructions were provided to the                            patient.                           -  Gastroparesis diet.                           - Continue present medications. Resume Xarelto                            tomorrow.                           - No aspirin or NSAIDs.                           - Continue omeprazole 40 mg every day.                           - Await pathology results.                           - Perform upper GI series.                           - Will need repeat EGD with planned dilation of                            duodenal stricture (after characterization with UGI                            series) in the outpatient hospital setting with                            fluoroscopy. Jerene Bears, MD 03/25/2020 2:55:53 PM This report has been signed electronically.

## 2020-03-25 NOTE — Progress Notes (Signed)
Pt's states no medical or surgical changes since previsit or office visit. 

## 2020-03-29 ENCOUNTER — Telehealth: Payer: Self-pay

## 2020-03-29 ENCOUNTER — Other Ambulatory Visit: Payer: Self-pay

## 2020-03-29 DIAGNOSIS — R1310 Dysphagia, unspecified: Secondary | ICD-10-CM

## 2020-03-29 NOTE — Telephone Encounter (Signed)
  Follow up Call-  Call back number 03/25/2020  Post procedure Call Back phone  # [phone]  Permission to leave phone message Yes  Some recent data might be hidden     Patient questions:  Do you have a fever, pain , or abdominal swelling? No. Pain Score  0 *  Have you tolerated food without any problems? Yes.    Have you been able to return to your normal activities? Yes.    Do you have any questions about your discharge instructions: Diet   No. Medications  No. Follow up visit  No.  Do you have questions or concerns about your Care? No.  Actions: * If pain score is 4 or above: No action needed, pain <4. 1. Have you developed a fever since your procedure? no  2.   Have you had an respiratory symptoms (SOB or cough) since your procedure? no  3.   Have you tested positive for COVID 19 since your procedure no  4.   Have you had any family members/close contacts diagnosed with the COVID 19 since your procedure?  no   If yes to any of these questions please route to Joylene John, RN and Joella Prince, RN

## 2020-03-29 NOTE — Telephone Encounter (Signed)
Pt scheduled for upper gi series at [facility] 04/15/20 at 9:30am, pt to arrive there at 9:15am and be NPO after midnight. Left message for pt to call back.

## 2020-03-30 NOTE — Telephone Encounter (Signed)
Spoke with pt and he is aware. 

## 2020-04-06 ENCOUNTER — Other Ambulatory Visit: Payer: Self-pay

## 2020-04-06 DIAGNOSIS — R1310 Dysphagia, unspecified: Secondary | ICD-10-CM

## 2020-04-08 ENCOUNTER — Other Ambulatory Visit: Payer: Self-pay

## 2020-04-08 ENCOUNTER — Encounter (HOSPITAL_COMMUNITY): Payer: Self-pay | Admitting: Gastroenterology

## 2020-04-09 ENCOUNTER — Other Ambulatory Visit: Payer: Self-pay | Admitting: Internal Medicine

## 2020-04-09 ENCOUNTER — Ambulatory Visit (HOSPITAL_COMMUNITY)
Admission: RE | Admit: 2020-04-09 | Discharge: 2020-04-09 | Disposition: A | Discharge status: Home / Self Care | Payer: Medicare Other | Source: Ambulatory Visit | Attending: Internal Medicine | Admitting: Internal Medicine

## 2020-04-09 DIAGNOSIS — R1310 Dysphagia, unspecified: Secondary | ICD-10-CM | POA: Insufficient documentation

## 2020-04-12 ENCOUNTER — Other Ambulatory Visit (HOSPITAL_COMMUNITY)
Admission: RE | Admit: 2020-04-12 | Discharge: 2020-04-12 | Disposition: A | Discharge status: Home / Self Care | Payer: Medicare Other | Source: Ambulatory Visit | Attending: Gastroenterology | Admitting: Gastroenterology

## 2020-04-12 DIAGNOSIS — Z20822 Contact with and (suspected) exposure to covid-19: Secondary | ICD-10-CM | POA: Diagnosis not present

## 2020-04-12 DIAGNOSIS — Z01812 Encounter for preprocedural laboratory examination: Secondary | ICD-10-CM | POA: Diagnosis present

## 2020-04-12 LAB — SARS CORONAVIRUS 2 (TAT 6-24 HRS): SARS Coronavirus 2: NEGATIVE

## 2020-04-14 ENCOUNTER — Ambulatory Visit: Payer: Self-pay | Admitting: *Deleted

## 2020-04-14 ENCOUNTER — Encounter (HOSPITAL_COMMUNITY): Payer: Self-pay | Admitting: Certified Registered"

## 2020-04-14 NOTE — Telephone Encounter (Signed)
Called to seek advise for stomach pain and patient reports he is to have EGD procedure tomorrow. Patient tested for covid Monday and results were negative on Tuesday. Patient reports going to [employer] after covid testing but he "wore a mask". Reports stomach pain after eating and now will only eat soup. Patient vomited x 1. Yesterday had chills, fever 101.4 at lunch and 1 hour ago temp 99.4 with headache. Encouraged patient to recheck temp tonight and in am and call [facility] at #[phone] to report temps prior to EGD procedure. Patient can be reached if procedure needs to be cancelled at [phone]. Care advise given. Patient verbalized understanding of care advise and to call back is symptoms worsen prior to procedure.   Reason for Disposition . Abdominal pain  Answer Assessment - Initial Assessment Questions 1. LOCATION: "Where does it hurt?"      Upper stomach  2. RADIATION: "Does the pain shoot anywhere else?" (e.g., chest, back)     No  3. ONSET: "When did the pain begin?" (e.g., minutes, hours or days ago)      Since March  4. SUDDEN: "Gradual or sudden onset?"     Gradual  5. PATTERN "Does the pain come and go, or is it constant?"    - If constant: "Is it getting better, staying the same, or worsening?"      (Note: Constant means the pain never goes away completely; most serious pain is constant and it progresses)     - If intermittent: "How long does it last?" "Do you have pain now?"     (Note: Intermittent means the pain goes away completely between bouts)     Comes and goes  6. SEVERITY: "How bad is the pain?"  (e.g., Scale 1-10; mild, moderate, or severe)    - MILD (1-3): doesn't interfere with normal activities, abdomen soft and not tender to touch     - MODERATE (4-7): interferes with normal activities or awakens from sleep, tender to touch     - SEVERE (8-10): excruciating pain, doubled over, unable to do any normal activities       Mild  7. RECURRENT SYMPTOM: "Have you  ever had this type of stomach pain before?" If Yes, ask: "When was the last time?" and "What happened that time?"      Yes  8. AGGRAVATING FACTORS: "Does anything seem to cause this pain?" (e.g., foods, stress, alcohol)     Some foods  9. CARDIAC SYMPTOMS: "Do you have any of the following symptoms: chest pain, difficulty breathing, sweating, nausea?"     no 10. OTHER SYMPTOMS: "Do you have any other symptoms?" (e.g., fever, vomiting, diarrhea)       Vomiting and fever  Protocols used: ABDOMINAL PAIN - UPPER-A-AH

## 2020-04-14 NOTE — Anesthesia Preprocedure Evaluation (Deleted)
Anesthesia Evaluation    Reviewed: Allergy & Precautions, Patient's Chart, lab work & pertinent test results, reviewed documented beta blocker date and time   Airway        Dental   Pulmonary sleep apnea and Continuous Positive Airway Pressure Ventilation , COPD, Patient abstained from smoking., former smoker,  Quit smoking 02/2020          Cardiovascular hypertension, Pt. on medications and Pt. on home beta blockers + Past MI       Neuro/Psych negative neurological ROS  negative psych ROS   GI/Hepatic Neg liver ROS, GERD  Medicated and Controlled,Dysphagia    Endo/Other  diabetesHypothyroidism Obesity BMI 33  Renal/GU negative Renal ROS  negative genitourinary   Musculoskeletal negative musculoskeletal ROS (+)   Abdominal   Peds  Hematology negative hematology ROS (+)   Anesthesia Other Findings   Reproductive/Obstetrics negative OB ROS                             Anesthesia Physical Anesthesia Plan  ASA: III  Anesthesia Plan: MAC   Post-op Pain Management:    Induction:   PONV Risk Score and Plan: 2 and Propofol infusion and TIVA  Airway Management Planned: Natural Airway and Simple Face Mask  Additional Equipment: None  Intra-op Plan:   Post-operative Plan:   Informed Consent:   Plan Discussed with:   Anesthesia Plan Comments:         Anesthesia Quick Evaluation

## 2020-04-15 ENCOUNTER — Telehealth: Payer: Self-pay

## 2020-04-15 ENCOUNTER — Ambulatory Visit (HOSPITAL_COMMUNITY): Payer: Medicare Other

## 2020-04-15 ENCOUNTER — Ambulatory Visit (HOSPITAL_COMMUNITY): Admission: RE | Admit: 2020-04-15 | Payer: Medicare Other | Source: Home / Self Care | Admitting: Gastroenterology

## 2020-04-15 HISTORY — DX: Presence of artificial eye: Z97.0

## 2020-04-15 SURGERY — ESOPHAGOGASTRODUODENOSCOPY (EGD) WITH PROPOFOL
Anesthesia: Monitor Anesthesia Care

## 2020-04-15 NOTE — Progress Notes (Signed)
Patient left messages for endoscopy, call returned Thurs am. Patient has had fever of 101 since late Tues, Tylenol only brought down to 99 and would immediately return after medication worn off. Lives in [place] and planning to go to an urgent care this am with some sinus congestion and pressure along with abdominal pain, which patient has been experiencing with his need for endoscopic procedure. Asked patient to call the office to reschedule after seeing Urgent Care. Alerted Dr [name] of plan.

## 2020-04-15 NOTE — Telephone Encounter (Signed)
-----   Message from Irving Copas., MD sent at 04/15/2020  4:35 PM EDT ----- Regarding: Follow up Nathan Cross,As you may have a known the patient canceled his procedure this morning due to having fevers and abdominal discomfort.  He was going to call the office to let us know how things are going.Please set him up for next available EGD with fluoroscopy in the hospital-based setting for duodenal stricture dilation.  If he is having further issues and abdominal discomfort and pain he may end up needing to get a CT scan but you can please let Dr. Hilarie Fredrickson and I know what is going on and we can decide if we need to do that or not.  I am okay to do him on a purple block morning if I do not have anyone else already scheduled or he can be set up for my first available or to be done on one of my hospital days as a first case in my next hospital week.FYI Dr. Hilarie Fredrickson.Thanks as always.GM

## 2020-04-16 ENCOUNTER — Other Ambulatory Visit: Payer: Self-pay

## 2020-04-16 DIAGNOSIS — R1310 Dysphagia, unspecified: Secondary | ICD-10-CM

## 2020-04-16 DIAGNOSIS — K315 Obstruction of duodenum: Secondary | ICD-10-CM

## 2020-04-16 NOTE — Telephone Encounter (Signed)
EGD has been scheduled at 115 pm on 06/07/20 COVID test on 12/9 at 9 am.  Left message on machine to call back

## 2020-04-19 ENCOUNTER — Telehealth: Payer: Self-pay | Admitting: Gastroenterology

## 2020-04-19 NOTE — Telephone Encounter (Signed)
EGD scheduled, pt instructed and medications reviewed.  Patient instructions mailed to home.  Patient to call with any questions or concerns.  

## 2020-04-19 NOTE — Telephone Encounter (Signed)
The pt wanted to see if there were any sooner appts.  I did tell him there is nothing sooner and if we get any cancellations we will call.  The pt has been advised of the information and verbalized understanding.

## 2020-04-23 ENCOUNTER — Telehealth: Payer: Self-pay

## 2020-04-23 NOTE — Telephone Encounter (Signed)
-----   Message from Irving Copas., MD sent at 04/22/2020  5:12 PM EDT ----- Regarding: RE: EGD [name],Thank you for sending this out.Yes, Nathan Cross, I will need probably 3 hours at a minimum for Nathan Cross as she has 3 very large polyps that need attempt at resection.It would be best for her to be the last case of the day.I am sorry that Nathan Cross case will have to be rescheduled but I am certainly happy to have it done sooner to help him as well which is why we would offer him during my hospital week.Thank you all.GM ----- Message ----- From: Debbe Mounts, CMA Sent: 04/22/2020   4:57 PM EDT To: Timothy Lasso, RN, Irving Copas., MD Subject: EGD                                            Dr Dionicio Stall would like for Nathan Cross to be moved to his hospital week 11/30 -Tues, Wed 12/1, or Thurs 12/2 .Due to  another extended case. He would like for Nathan Cross to be his last case of that day due to the time it will take with her procedure. There is not enough time for Nathan Cross case on 06/07/20.

## 2020-04-23 NOTE — Telephone Encounter (Signed)
pt instructed with  New date and time and medications reviewed.  Patient instructions mailed to home.  Patient to call with any questions or concerns.

## 2020-04-23 NOTE — Telephone Encounter (Signed)
appt changed to 12/2 730 am at [facility].  COVID test changed to 11/29.  New instructions mailed.  Left message on machine to call back

## 2020-04-26 DIAGNOSIS — R1310 Dysphagia, unspecified: Secondary | ICD-10-CM

## 2020-04-26 HISTORY — DX: Dysphagia, unspecified: R13.10

## 2020-04-27 ENCOUNTER — Encounter: Payer: Medicare Other | Admitting: Internal Medicine

## 2020-05-05 HISTORY — PX: ELECTROPHYSIOLOGIC STUDY: SHX172A

## 2020-05-06 HISTORY — PX: CARDIAC CATHETERIZATION: SHX172

## 2020-05-06 HISTORY — PX: ELECTROPHYSIOLOGIC STUDY: SHX172A

## 2020-05-24 ENCOUNTER — Other Ambulatory Visit (HOSPITAL_COMMUNITY)
Admission: RE | Admit: 2020-05-24 | Discharge: 2020-05-24 | Disposition: A | Discharge status: Home / Self Care | Payer: Medicare Other | Source: Ambulatory Visit | Attending: Gastroenterology | Admitting: Gastroenterology

## 2020-05-24 DIAGNOSIS — Z01812 Encounter for preprocedural laboratory examination: Secondary | ICD-10-CM | POA: Diagnosis present

## 2020-05-24 DIAGNOSIS — Z20822 Contact with and (suspected) exposure to covid-19: Secondary | ICD-10-CM | POA: Insufficient documentation

## 2020-05-24 LAB — SARS CORONAVIRUS 2 (TAT 6-24 HRS): SARS Coronavirus 2: NEGATIVE

## 2020-05-25 ENCOUNTER — Encounter (HOSPITAL_COMMUNITY): Payer: Self-pay | Admitting: Gastroenterology

## 2020-05-25 NOTE — Progress Notes (Signed)
Patient denies shortness of breath, fever, cough or chest pain.  PCP - Thornton Papas, FNP Cardiologist - Dr Alroy Dust   Chest x-ray - 05/04/20 CE EKG - 06/06/19, 05/06/20 CE Stress Test - 05/06/19 ECHO - 02/01/15 CE Cardiac Cath - 05/05/20 [facility]  Sleep Study -  Yes CPAP - wears cpap  Fasting Blood Sugar - 110-120s Checks Blood Sugar 1 times a day  . Do not take Metformin on the morning of surgery.  THE MORNING OF SURGERY, do not take take Novolog Insulin unless your CBG is greater than 220 mg/dL, you may take  of your sliding scale (correction) dose of insulin.  . If your blood sugar is less than 70 mg/dL, you will need to treat for low blood sugar: o Treat a low blood sugar (less than 70 mg/dL) with  cup of clear juice (cranberry or apple), 4 glucose tablets, OR glucose gel. o Recheck blood sugar in 15 minutes after treatment (to make sure it is greater than 70 mg/dL). If your blood sugar is not greater than 70 mg/dL on recheck, call [phone] for further instructions.  Blood Thinner Instructions:  Last dose of Xarelto was on 05/24/20.  Anesthesia review: Yes  STOP now taking any Aspirin (unless otherwise instructed by your surgeon), Aleve, Naproxen, Ibuprofen, Motrin, Advil, Goody's, BC's, all herbal medications, fish oil, and all vitamins.   Coronavirus Screening Covid test on 05/24/20 was negative.  Patient verbalized understanding of instructions that were given via phone.

## 2020-05-26 ENCOUNTER — Encounter (HOSPITAL_COMMUNITY): Payer: Self-pay | Admitting: Gastroenterology

## 2020-05-26 ENCOUNTER — Other Ambulatory Visit: Payer: Self-pay

## 2020-05-26 ENCOUNTER — Telehealth: Payer: Self-pay

## 2020-05-26 NOTE — Progress Notes (Addendum)
Anesthesia Chart Review: Nathan Cross   Case: [id number] Date/Time: 05/27/20 0730   Procedure: ESOPHAGOGASTRODUODENOSCOPY (EGD) WITH PROPOFOL (N/A ) - fluoro   Anesthesia type: Monitor Anesthesia Care   Pre-op diagnosis: dysphagia duodenal stricture   Location: [place] [facility] ENDOSCOPY   Surgeons: Mansouraty, Telford Nab., MD      DISCUSSION: Patient is a 69 year old male scheduled for the above procedure. Procedure was initially scheduled for October, but it had to be delayed due to fever related to tooth abscess which has now been treated.   History includes former smoker (quit 02/25/20), CAD (s/p CABG ~ 2004 in [place], [place]; by notes in [place], "LIMA-to-LAD, SVG-to-RPDA, SVG-to-OM, he does have a L radial artery graft"; s/p DES to SVG-RPDA ~ 2012; inferior STEMI with mild LV dysfunction 45% with occluded DES at SVG-RPDA in setting of not being on DAPT due to GI bleed, no revascularized 03/2014), PAF (s/p afib ablation 05/05/20; s/p unsuccessful DCCV 05/06/20), OSA (CPAP), HTN, hypothyroidism, left prosthetic eye, back surgery (L1-4 anterolateral fusion 06/10/19). 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 BMI is consistent with obesity.  On 02/26/20, cardiologist Dr. Rosalita Chessman cleared patient to hold Xarelto for 24 hours prior to EGD. Since then Dr. Rosalita Chessman referred Nathan Cross to EP cardiologist Dr. Westley Gambles with [facility] (but also sees patient at the [facility] office). He was referred for paroxysmal/persistent afib with AT/AF 48% burden on ambulatory EKG monitoring despite amiodarone and with associated symptoms of palpitations and decreased exercise tolerance. He underwent afib ablation 05/05/20 and unsuccessful DCCV 05/06/20. Patient reports he has EP follow-up visit 07/05/20. He has noted improvement in the frequency of his afib since undergoing ablation. He can tell when his HR is irregular and notices brief episodes about every  five days. He says that Dr. Westley Gambles was aware that this procedure was scheduled for December. Last Xarelto 05/24/20. He plans to resume on 05/27/20 unless otherwise instructed by Dr. [name]. Patient is anxious to get procedure as he is having quite a bit of bloating after eating and has had to dramatically change want he eats to avoid symptoms.   I updated Dr. [name] and anesthesiologist Laurie Panda, MD of afib ablation and unsuccessful DCCV about 3 weeks ago. As above, patient has noticed improvement since undergoing afib ablation but sounds like he is still having at least some breakthrough episodes. He is compliant with his medications (including amiodarone and Toprol). Further evaluation on the day of procedure. 05/24/20 preprocedure COVID-19 test negative.   VS: Ht 6\' 3"  (1.905 m)   Wt 117.9 kg   BMI 32.50 kg/m   BP Readings from Last 3 Encounters:  03/25/20 (!) 109/48  01/29/20 110/74  06/11/19 105/75   Pulse Readings from Last 3 Encounters:  03/25/20 (!) 50  01/29/20 77  06/11/19 67    PROVIDERS: Frances Maywood, FNP is PCP  Delanna Notice, MD is cardiologist ([place]). It appears that previously he was followed by [facility] in from 10/30/14-08/09/15 (see Care Everywhere). Fatima Sanger, MD is EP cardiologist   LABS: Per GI/Anesthesia.   IMAGES: CXR 05/04/20: FINDINGS/IMPRESSION:   1. Mediastinal and cardiac contours are within normal limits. Status post  median sternotomy.  2. There are scattered bilateral opacities favored represent atelectasis.  3. No large effusion or pneumothorax.  4. Degenerative changes of the thoracic spine.     EKG:  EKG 05/06/20: Per result  narrative [facility] CE: Atrial fibrillation at 80 bpm RSR' in V1 or V2  Prolonged QT  Abnormal ECG  When compared with ECG of 06 May 2020 04:57  No significant change was found   EKG 03/15/20 ([facility]):  Sinus bradycardia at 52 bpm RSR (V1),  non-diagnostic   CV: Cardiac MRI/MRA 05/04/20: Cardiac MRI:  1. The left ventricle is moderatelyenlarged in cavity size and there is mild eccentric LV wall hypertrophy. Global systolic function is normal. The LV ejection fraction is 63%. The basal and midventricular inferior wall is moderately hypokinetic.  2. The right ventricle is normal in cavity size, wall thickness, and systolic function.  3.The left atrium is moderately enlarged, the right atrium is mildly enlarged in size.  4. The aortic valve is trileaflet in morphology. There is no significant aortic valve stenosis or regurgitation. There is mild mitral regurgitation.  5. Delayed enhancement imaging is abnormal. There is a subendocardial infarct in the inferior wall from base to apical levels consistent with the RCA/PDA perfusion territory.  6. No intracardiac thrombus visualized.  Thoracic MRA:  1. There are four pulmonary veins entering the left atrium (two on the right and two on the left). All veins are patent without significant stenosis proximally. The bi-orthogonal luminal dimensions are listed below:   RUPV: 2.6 x 2.1 cm  RLPV: 2.1 x 1.5 cm  LUPV: 2.3 x 1.6 cm  LLPV: 2.5 x 1.4 cm   LA measurements:  Right-Left: 7.6 cm  Anterior-Posterior: 4.7 cm  Head-Foot: 6.6 cm   2. The thoracic aorta is normal in diameter. There is no dissection seen.  3. The main and proximal branch pulmonary arteries are normal in size  4. Systemic venous connections are normal.  5. The esophagus traverses posterior of the left atrium in close proximity to the ostia of the left-sided pulmonary veins.   CONCLUSION.  NormalLVEF but with moderate inferior hypokinesis and a subendocardial infarct in the inferior wall.    [place] 01/28/20 (as outlined in Dr. Otho Najjar 03/15/20 note in [place]): minHR-41, avgHR-59, maxHR-97bpm, PAC<1%, PVC<1%, AT/AF w/ RVR up to 166bpm (AT/AF=58%, +Sx during AFib with VR=71bpml averageVR in AT/AF=88bpm, VRrange in  AT/AF=49-166bpm), no prolonged pauses    Nuclear stress test 05/06/19 ([facility]): Perfusion imaging: There is a moderate sized fixed perfusion abnormality of moderate intensity in the inferior wall from the base to the apex.  The remainder the ventricle has normal perfusion at rest and with stress. Wall motion: There is hypokinesis in the inferior wall from the base to the apex.  The remainder the ventricle functions normally.  The LV cavity size is normal at rest and with stress, is unchanged.  The ejection fraction is 69%. Overall: This vasodilator stress test is negative for ischemia, but with evidence of infarction.  LVEF 69%. (Comparison NST 11/10/14 in [facility]: Inferior myocardial infarction with peri-infarct ischemia, EF 62%)   Echo 11/22/17 ([facility]): Impression: Normal LV size with borderline normal function.  The ejection fraction is 50 to 55%.  There is mild left ventricular hypertrophy. Mildly dilated right ventricle. Mildly dilated left atrium. Mildly dilated right atrium. Normal, trileaflet aortic valve.  There is a normal aortic valve area. Normal mitral valve. There is mild tricuspid regurgitation.   RHC/LHC 11/09/17 ([facility]; scanned under Media tab, Correspondence 06/10/19): Findings: 1.  LMCA: Normal 2.  LAD: 80% proximal 3.  Circumflex: 100% ostial 4.  RCA: 100% proximal 5.  Saphenous vein graft to RCA: Occluded 6.  Saphenous vein graft  to OM: Patent 7.  LIMA to LAD: Patent 8.  There are left-to-left and left-to-right collaterals noted filling the distal PDA and distal circumflex/3rd obtuse marginal system. 9.  LVEDP: 12 mmHg 10.  RCA PCWP: 10 mmHg PA: 03/17/12 mmHg RV: 29/6 mmHg RA: 4 mmHg CO: 4.6 L/min CI: 1.81 L/min/m Recommendations: Medical management of dyspnea the diet and exercise advised.   Past Medical History:  Diagnosis Date  . Adenomatous colon polyp 2017  . Anxiety   . CHF (congestive heart failure) ([place])   . COPD  (chronic obstructive pulmonary disease) ([facility])    past hx - low end   . Coronary artery disease   . Diabetes mellitus without complication ([place])    type 2  . Diverticulosis   . 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 Dysphagia 04/2020  . Dysrhythmia    Paroxysmal atrial fibrillation   . ED (erectile dysfunction)   . GERD (gastroesophageal reflux disease)   . History of eye prosthesis    Left  . Hyperlipidemia    controlled on meds   . Hypertension   . Hypothyroidism   . 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 Myocardial infarction ([place])   . PAF (paroxysmal atrial fibrillation) ([place])   . Sleep apnea    wears cpap     Past Surgical History:  Procedure Laterality Date  . ANTERIOR LAT LUMBAR FUSION N/A 06/10/2019   Procedure: Lumbar one-two , Lumbar two-three, Lumbar three-four- Anterolateral decompression/fusion with percutaneous pedicle screw fixation;  Surgeon: Erline Levine, MD;  Location: [facility];  Service: Neurosurgery;  Laterality: N/A;  . CARDIAC CATHETERIZATION  05/06/2020   [facility]  . COLONOSCOPY    . CORONARY ARTERY BYPASS GRAFT  2004   CABG x 4   . ELECTROPHYSIOLOGIC STUDY  05/06/2020   [facility] - ABLATION A-fib pulm vein    . HERNIA REPAIR    . KIDNEY DONATION Right 1996  . LUMBAR PERCUTANEOUS PEDICLE SCREW 3 LEVEL N/A 06/10/2019   Procedure: LUMBAR PERCUTANEOUS PEDICLE SCREW 3 LEVEL;  Surgeon: Erline Levine, MD;  Location: [facility];  Service: Neurosurgery;  Laterality: N/A;  . MEDIAL PARTIAL KNEE REPLACEMENT Bilateral   . POLYPECTOMY    . Prosthetic Eye Left   . TONSILLECTOMY     as child   . UPPER GASTROINTESTINAL ENDOSCOPY      MEDICATIONS: No current facility-administered medications for this encounter.   Marland Kitchen acetaminophen (TYLENOL) 650 MG CR tablet  . amiodarone (PACERONE) 200 MG tablet  . Cholecalciferol (VITAMIN D3 PO)  . clobetasol cream (TEMOVATE) 0.05 %  . clonazePAM (KLONOPIN) 0.5 MG tablet  . furosemide (LASIX) 20 MG  tablet  . gemfibrozil (LOPID) 600 MG tablet  . icosapent Ethyl (VASCEPA) 1 g capsule  . levothyroxine (SYNTHROID) 50 MCG tablet  . lisinopril (ZESTRIL) 5 MG tablet  . metFORMIN (GLUCOPHAGE) 500 MG tablet  . metoprolol succinate (TOPROL-XL) 50 MG 24 hr tablet  . nitroGLYCERIN (NITROSTAT) 0.4 MG SL tablet  . NOVOLOG FLEXPEN 100 UNIT/ML FlexPen  . omeprazole (PRILOSEC) 20 MG capsule  . rosuvastatin (CRESTOR) 20 MG tablet  . sildenafil (REVATIO) 20 MG tablet  . vitamin C (ASCORBIC ACID) 500 MG tablet  . XARELTO 20 MG TABS tablet    Myra Gianotti, PA-C Surgical Short Stay/Anesthesiology [facility] Phone [phone] [facility] Phone [phone] 05/26/2020 6:19 PM

## 2020-05-26 NOTE — Progress Notes (Signed)
Faxed request for last office note, EKG tracing, Stress Test, Cardiac Studies from Dr Fatima Sanger at [facility] and Dr Rosalita Chessman at [facility].

## 2020-05-26 NOTE — Telephone Encounter (Signed)
----- Message from Jacinta Shoe, PA-C sent at 05/26/2020  4:37 PM EST ----- Regarding: RE: EGD 12/2 I reviewed his last primary cardiology and EP notes that were all just before the ablation/cardioversion.  I even left a message for Dr. Westley Gambles (EP)'s nurse, but haven't heard back. Mr. Nathan Cross said his PAF episodes are much better/less often since his ablation, and he is compliant with his meds. He said he had told Dr. Westley Gambles prior to the ablation that he had this procedure rescheduled for December. He plans to resume Xarelto tomorrow night.  I told this all this info and reviewed with anesthesiologist Dr. Ermalene Postin.  Unless there is some acute change or he is in rapid afib, then we anticipate he will be able to proceed as scheduled tomorrow.   Myra Gianotti, PA-C Surgical Short Stay/Anesthesiology [facility] Phone [phone] 05/26/2020 4:42 PM      ----- Message ----- From: Timothy Lasso, RN Sent: 05/26/2020   2:14 PM EST To: Jacinta Shoe, PA-C, # Subject: RE: EGD 12/2                                   Under the media tab dated 02/26/20 there is a letter scanned into the system giving the ok to hold the xarelto.   ----- Message ----- From: Irving Copas., MD Sent: 05/26/2020   1:41 PM EST To: Jacinta Shoe, PA-C, Timothy Lasso, RN, # Subject: RE: EGD 12/2                                   Thanks for looking into this. I'm not sure. If his DCCV was not successful, and he still has Afib, then I'm not sure there is much of a timetable in regards to need for continued anticoagulation if he never got himself out of it. Please let us know if you find anything else. He was to hold anticoagulation for our procedure so we should have received approval for this. I'll place my team on this to see if they have any other information about this patient. Thanks. [name] and Darlean Warmoth, do you have any more information about this patient and the approval from Cardiology to come off his  anticoagulation for his procedure? Thanks. GM ----- Message ----- From: Rutherford Limerick Sent: 05/26/2020  12:32 PM EST To: Irving Copas., MD Subject: EGD 12/2                                       Dr. [name], Mr. Corbit is for EGD 05/27/20. Staff are calling now to get his last cardiology records from [place] (Dr. Rosalita Chessman, EP Dr. Westley Gambles), but it looks like since his last GI procedure he underwent afib ablation 05/05/20 then unsuccessful cardioversion on 05/06/20. I'm not sure if there was a specific timeframe he was to continue Xarelto post ablation/DCCV, but he did report last dose 05/24/20.   I will be out of the office for the next hour or so, but I'm hoping to have records when I return.  Karoline Caldwell, PA-C can be reached at my office number if needed.)  I was just wondering if you had any info that I don't have, otherwise I can let you know if I  find out anything.    Thanks,   Myra Gianotti, PA-C Surgical Short Stay/Anesthesiology [facility] Phone [phone] 05/26/2020 12:49 PM

## 2020-05-27 ENCOUNTER — Encounter (HOSPITAL_COMMUNITY): Payer: Self-pay | Admitting: Gastroenterology

## 2020-05-27 ENCOUNTER — Ambulatory Visit (HOSPITAL_COMMUNITY): Payer: Medicare Other | Admitting: Vascular Surgery

## 2020-05-27 ENCOUNTER — Encounter (HOSPITAL_COMMUNITY)
Admission: RE | Disposition: A | Discharge status: Home / Self Care | Payer: Self-pay | Source: Home / Self Care | Attending: Gastroenterology

## 2020-05-27 ENCOUNTER — Ambulatory Visit (HOSPITAL_COMMUNITY)
Admission: RE | Admit: 2020-05-27 | Discharge: 2020-05-27 | Disposition: A | Discharge status: Home / Self Care | Payer: Medicare Other | Attending: Gastroenterology | Admitting: Gastroenterology

## 2020-05-27 ENCOUNTER — Ambulatory Visit (HOSPITAL_COMMUNITY): Payer: Medicare Other

## 2020-05-27 DIAGNOSIS — R1310 Dysphagia, unspecified: Secondary | ICD-10-CM

## 2020-05-27 DIAGNOSIS — Z87891 Personal history of nicotine dependence: Secondary | ICD-10-CM | POA: Diagnosis not present

## 2020-05-27 DIAGNOSIS — X58XXXA Exposure to other specified factors, initial encounter: Secondary | ICD-10-CM | POA: Diagnosis not present

## 2020-05-27 DIAGNOSIS — Z794 Long term (current) use of insulin: Secondary | ICD-10-CM | POA: Insufficient documentation

## 2020-05-27 DIAGNOSIS — Z970 Presence of artificial eye: Secondary | ICD-10-CM | POA: Insufficient documentation

## 2020-05-27 DIAGNOSIS — T183XXA Foreign body in small intestine, initial encounter: Secondary | ICD-10-CM | POA: Diagnosis not present

## 2020-05-27 DIAGNOSIS — K449 Diaphragmatic hernia without obstruction or gangrene: Secondary | ICD-10-CM | POA: Diagnosis not present

## 2020-05-27 DIAGNOSIS — Z79890 Hormone replacement therapy (postmenopausal): Secondary | ICD-10-CM | POA: Insufficient documentation

## 2020-05-27 DIAGNOSIS — T182XXA Foreign body in stomach, initial encounter: Secondary | ICD-10-CM

## 2020-05-27 DIAGNOSIS — R1084 Generalized abdominal pain: Secondary | ICD-10-CM | POA: Insufficient documentation

## 2020-05-27 DIAGNOSIS — Z96653 Presence of artificial knee joint, bilateral: Secondary | ICD-10-CM | POA: Insufficient documentation

## 2020-05-27 DIAGNOSIS — Z951 Presence of aortocoronary bypass graft: Secondary | ICD-10-CM | POA: Diagnosis not present

## 2020-05-27 DIAGNOSIS — K315 Obstruction of duodenum: Secondary | ICD-10-CM | POA: Diagnosis present

## 2020-05-27 DIAGNOSIS — Z7984 Long term (current) use of oral hypoglycemic drugs: Secondary | ICD-10-CM | POA: Insufficient documentation

## 2020-05-27 DIAGNOSIS — Z79899 Other long term (current) drug therapy: Secondary | ICD-10-CM | POA: Diagnosis not present

## 2020-05-27 DIAGNOSIS — K3189 Other diseases of stomach and duodenum: Secondary | ICD-10-CM | POA: Insufficient documentation

## 2020-05-27 DIAGNOSIS — Z7901 Long term (current) use of anticoagulants: Secondary | ICD-10-CM | POA: Insufficient documentation

## 2020-05-27 DIAGNOSIS — K222 Esophageal obstruction: Secondary | ICD-10-CM | POA: Diagnosis not present

## 2020-05-27 HISTORY — DX: Anxiety disorder, unspecified: F41.9

## 2020-05-27 HISTORY — DX: Male erectile dysfunction, unspecified: N52.9

## 2020-05-27 HISTORY — DX: Heart failure, unspecified: I50.9

## 2020-05-27 HISTORY — PX: ESOPHAGOGASTRODUODENOSCOPY (EGD) WITH PROPOFOL: SHX5813

## 2020-05-27 HISTORY — PX: FOREIGN BODY REMOVAL: SHX962

## 2020-05-27 HISTORY — PX: BALLOON DILATION: SHX5330

## 2020-05-27 HISTORY — DX: Cardiac arrhythmia, unspecified: I49.9

## 2020-05-27 HISTORY — DX: Atherosclerotic heart disease of native coronary artery without angina pectoris: I25.10

## 2020-05-27 SURGERY — ESOPHAGOGASTRODUODENOSCOPY (EGD) WITH PROPOFOL
Anesthesia: General

## 2020-05-27 MED ORDER — OMEPRAZOLE 40 MG PO CPDR
40.0000 mg | DELAYED_RELEASE_CAPSULE | Freq: Two times a day (BID) | ORAL | 4 refills | Status: DC
Start: 2020-05-27 — End: 2020-09-21

## 2020-05-27 MED ORDER — DEXAMETHASONE SODIUM PHOSPHATE 4 MG/ML IJ SOLN
INTRAMUSCULAR | Status: DC | PRN
  Administered 2020-05-27: 4 mg via INTRAVENOUS

## 2020-05-27 MED ORDER — LIDOCAINE 2% (20 MG/ML) 5 ML SYRINGE
INTRAMUSCULAR | Status: DC | PRN
  Administered 2020-05-27: 40 mg via INTRAVENOUS

## 2020-05-27 MED ORDER — LACTATED RINGERS IV SOLN: INTRAVENOUS | Status: DC | PRN

## 2020-05-27 MED ORDER — IOHEXOL 300 MG/ML  SOLN
INTRAMUSCULAR | Status: DC | PRN
  Administered 2020-05-27: 20 mL

## 2020-05-27 MED ORDER — SUGAMMADEX SODIUM 200 MG/2ML IV SOLN
INTRAVENOUS | Status: DC | PRN
  Administered 2020-05-27: 300 mg via INTRAVENOUS

## 2020-05-27 MED ORDER — SUCCINYLCHOLINE CHLORIDE 20 MG/ML IJ SOLN
INTRAMUSCULAR | Status: DC | PRN
  Administered 2020-05-27: 160 mg via INTRAVENOUS

## 2020-05-27 MED ORDER — SODIUM CHLORIDE 0.9 % IV SOLN: INTRAVENOUS | Status: DC

## 2020-05-27 MED ORDER — GLYCOPYRROLATE PF 0.2 MG/ML IJ SOSY
PREFILLED_SYRINGE | INTRAMUSCULAR | Status: DC | PRN
  Administered 2020-05-27: .2 mg via INTRAVENOUS

## 2020-05-27 MED ORDER — ONDANSETRON HCL 4 MG/2ML IJ SOLN
INTRAMUSCULAR | Status: DC | PRN
  Administered 2020-05-27: 4 mg via INTRAVENOUS

## 2020-05-27 MED ORDER — PROPOFOL 500 MG/50ML IV EMUL
INTRAVENOUS | Status: DC | PRN
  Administered 2020-05-27: 150 ug/kg/min via INTRAVENOUS
  Administered 2020-05-27: 160 mg via INTRAVENOUS

## 2020-05-27 MED ORDER — EPHEDRINE SULFATE-NACL 50-0.9 MG/10ML-% IV SOSY
PREFILLED_SYRINGE | INTRAVENOUS | Status: DC | PRN
  Administered 2020-05-27 (×3): 10 mg via INTRAVENOUS

## 2020-05-27 MED ORDER — XARELTO 20 MG PO TABS
20.0000 mg | ORAL_TABLET | Freq: Every day | ORAL | 0 refills | Status: DC
Start: 2020-05-30 — End: 2020-09-06

## 2020-05-27 MED ORDER — ROCURONIUM BROMIDE 10 MG/ML (PF) SYRINGE
PREFILLED_SYRINGE | INTRAVENOUS | Status: DC | PRN
  Administered 2020-05-27: 40 mg via INTRAVENOUS

## 2020-05-27 SURGICAL SUPPLY — 14 items

## 2020-05-27 NOTE — Transfer of Care (Signed)
Immediate Anesthesia Transfer of Care Note  Patient: Nathan Cross  Procedure(s) Performed: ESOPHAGOGASTRODUODENOSCOPY (EGD) WITH PROPOFOL (N/A ) FOREIGN BODY REMOVAL  Patient Location: Endoscopy Unit  Anesthesia Type:General  Level of Consciousness: awake, alert , oriented and patient cooperative  Airway & Oxygen Therapy: Patient Spontanous Breathing and Patient connected to face mask oxygen  Post-op Assessment: Report given to RN, Post -op Vital signs reviewed and stable and Patient moving all extremities  Post vital signs: Reviewed and stable  Last Vitals:  Vitals Value Taken Time  BP 115/86 05/27/20 0914  Temp    Pulse 67 05/27/20 0916  Resp 14 05/27/20 0917  SpO2 100 % 05/27/20 0916  Vitals shown include unvalidated device data.  Last Pain:  Vitals:   05/27/20 0658  TempSrc: Oral  PainSc: 0-No pain         Complications: No complications documented.

## 2020-05-27 NOTE — H&P (Signed)
GASTROENTEROLOGY PROCEDURE H&P NOTE   Primary Care Physician: Frances Maywood, FNP  HPI: Nathan Cross is a 69 y.o. male who presents for EGD with attempt at dilation of duodenal stricture.  History of anticoagulation use for Afib (off since Monday).  Past Medical History:  Diagnosis Date  . Adenomatous colon polyp 2017  . Anxiety   . CHF (congestive heart failure) ([place])   . COPD (chronic obstructive pulmonary disease) ([facility])    past hx - low end   . Coronary artery disease   . Diabetes mellitus without complication ([place])    type 2  . Diverticulosis   . 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 Dysphagia 04/2020  . Dysrhythmia    Paroxysmal atrial fibrillation   . ED (erectile dysfunction)   . GERD (gastroesophageal reflux disease)   . History of eye prosthesis    Left  . Hyperlipidemia    controlled on meds   . Hypertension   . Hypothyroidism   . 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 Myocardial infarction ([place])   . PAF (paroxysmal atrial fibrillation) ([place])   . Sleep apnea    wears cpap    Past Surgical History:  Procedure Laterality Date  . ANTERIOR LAT LUMBAR FUSION N/A 06/10/2019   Procedure: Lumbar one-two , Lumbar two-three, Lumbar three-four- Anterolateral decompression/fusion with percutaneous pedicle screw fixation;  Surgeon: Erline Levine, MD;  Location: [place];  Service: Neurosurgery;  Laterality: N/A;  . CARDIAC CATHETERIZATION  11/09/2017  . COLONOSCOPY    . CORONARY ARTERY BYPASS GRAFT  2004   CABG x 4   . ELECTROPHYSIOLOGIC STUDY  05/05/2020   [facility] - ABLATION A-fib pulm vein    . HERNIA REPAIR    . KIDNEY DONATION Right 1996  . LUMBAR PERCUTANEOUS PEDICLE SCREW 3 LEVEL N/A 06/10/2019   Procedure: LUMBAR PERCUTANEOUS PEDICLE SCREW 3 LEVEL;  Surgeon: Erline Levine, MD;  Location: [facility];  Service: Neurosurgery;  Laterality: N/A;  . MEDIAL PARTIAL KNEE REPLACEMENT Bilateral   . POLYPECTOMY    . Prosthetic Eye Left     . TONSILLECTOMY     as child   . UPPER GASTROINTESTINAL ENDOSCOPY     Current Facility-Administered Medications  Medication Dose Route Frequency Provider Last Rate Last Admin  . 0.9 %  sodium chloride infusion   Intravenous Continuous Mansouraty, Telford Nab., MD       No Known Allergies Family History  Problem Relation Age of Onset  . Colon cancer Neg Hx   . Stomach cancer Neg Hx   . Pancreatic cancer Neg Hx   . Esophageal cancer Neg Hx   . Colon polyps Neg Hx   . Rectal cancer Neg Hx    Social History   Socioeconomic History  . Marital status: Married    Spouse name: Not on file  . Number of children: Not on file  . Years of education: Not on file  . Highest education level: Not on file  Occupational History  . Not on file  Tobacco Use  . Smoking status: Former Smoker    Quit date: 02/25/2020    Years since quitting: 0.2  . Smokeless tobacco: Never Used  Vaping Use  . Vaping Use: Never used  Substance and Sexual Activity  . Alcohol use: Yes    Comment: occassionally  . Drug use: Never  . Sexual activity: Yes  Other Topics Concern  . Not on file  Social History  Narrative  . Not on file   Social Determinants of Health   Financial Resource Strain:   . Difficulty of Paying Living Expenses: Not on file  Food Insecurity:   . Worried About Charity fundraiser in the Last Year: Not on file  . Ran Out of Food in the Last Year: Not on file  Transportation Needs:   . Lack of Transportation (Medical): Not on file  . Lack of Transportation (Non-Medical): Not on file  Physical Activity:   . Days of Exercise per Week: Not on file  . Minutes of Exercise per Session: Not on file  Stress:   . Feeling of Stress : Not on file  Social Connections:   . Frequency of Communication with Friends and Family: Not on file  . Frequency of Social Gatherings with Friends and Family: Not on file  . Attends Religious Services: Not on file  . Active Member of Clubs or Organizations: Not  on file  . Attends Archivist Meetings: Not on file  . Marital Status: Not on file  Intimate Partner Violence:   . Fear of Current or Ex-Partner: Not on file  . Emotionally Abused: Not on file  . Physically Abused: Not on file  . Sexually Abused: Not on file    Physical Exam: Vital signs in last 24 hours: Temp:  [97.5 F (36.4 C)] 97.5 F (36.4 C) (12/02 0658) Pulse Rate:  [56] 56 (12/02 0658) Resp:  [14] 14 (12/02 0658) BP: (133)/(66) 133/66 (12/02 0658) SpO2:  [98 %] 98 % (12/02 0658) Weight:  [117.9 kg] 117.9 kg (12/02 0658)   GEN: NAD EYE: Sclerae anicteric ENT: MMM CV: Non-tachycardic GI: Soft, NT/ND NEURO:  Alert & Oriented x 3  Lab Results: No results for input(s): WBC, HGB, HCT, PLT in the last 72 hours. BMET No results for input(s): NA, K, CL, CO2, GLUCOSE, BUN, CREATININE, CALCIUM in the last 72 hours. LFT No results for input(s): PROT, ALBUMIN, AST, ALT, ALKPHOS, BILITOT, BILIDIR, IBILI in the last 72 hours. PT/INR No results for input(s): LABPROT, INR in the last 72 hours.   Impression / Plan: This is a 69 y.o.male who presents for EGD with attempt at dilation of duodenal stricture.  History of anticoagulation use for Afib (off since Monday).  The risks and benefits of endoscopic evaluation were discussed with the patient; these include but are not limited to the risk of perforation, infection, bleeding, missed lesions, lack of diagnosis, severe illness requiring hospitalization, as well as anesthesia and sedation related illnesses.  The patient is agreeable to proceed.    Justice Britain, MD [facility] Office # [phone]

## 2020-05-27 NOTE — Anesthesia Preprocedure Evaluation (Addendum)
Anesthesia Evaluation   Patient awake    Reviewed: Allergy & Precautions, NPO status , Patient's Chart, lab work & pertinent test results  History of Anesthesia Complications Negative for: history of anesthetic complications  Airway Mallampati: IV  TM Distance: >3 FB Neck ROM: Full    Dental  (+) Dental Advisory Given, Teeth Intact   Pulmonary neg shortness of breath, sleep apnea and Continuous Positive Airway Pressure Ventilation , COPD, neg recent URI, Patient abstained from smoking., former smoker,  Covid-19 Nucleic Acid Test Results Lab Results      Component                Value               Date                      [place]              NEGATIVE            05/24/2020                [place]              NEGATIVE            04/12/2020                [place]              NOT DETECTED        06/06/2019              breath sounds clear to auscultation       Cardiovascular hypertension, Pt. on medications and Pt. on home beta blockers + CAD, + Past MI, + CABG and +CHF  + dysrhythmias Atrial Fibrillation  Rhythm:Irregular     Neuro/Psych PSYCHIATRIC DISORDERS Anxiety negative neurological ROS     GI/Hepatic Neg liver ROS, GERD  ,No results found for: ALT, AST, GGT, ALKPHOS, BILITOT  dysphagia duodenal stricture   Endo/Other  diabetesHypothyroidism Lab Results      Component                Value               Date                      HGBA1C                   5.6                 06/06/2019             Renal/GU Lab Results      Component                Value               Date                      CREATININE               1.20                06/06/2019            Single kidney s/p donation     Musculoskeletal negative musculoskeletal ROS (+)   Abdominal   Peds  Hematology  (+) Blood dyscrasia, anemia , Lab Results      Component  Value               Date                      WBC                       7.0                 06/06/2019                HGB                      12.5 (L)            06/06/2019                HCT                      38.1 (L)            06/06/2019                MCV                      93.6                06/06/2019                PLT                      210                 06/06/2019              Anesthesia Other Findings   Reproductive/Obstetrics                           Anesthesia Physical Anesthesia Plan  ASA: III  Anesthesia Plan: MAC   Post-op Pain Management:    Induction: Intravenous  PONV Risk Score and Plan: 1 and Propofol infusion  Airway Management Planned: Nasal Cannula  Additional Equipment: None  Intra-op Plan:   Post-operative Plan:   Informed Consent: I have reviewed the patients History and Physical, chart, labs and discussed the procedure including the risks, benefits and alternatives for the proposed anesthesia with the patient or authorized representative who has indicated his/her understanding and acceptance.     Dental advisory given  Plan Discussed with: CRNA and Surgeon  Anesthesia Plan Comments:         Anesthesia Quick Evaluation

## 2020-05-27 NOTE — Anesthesia Postprocedure Evaluation (Signed)
Anesthesia Post Note  Patient: Nathan Cross  Procedure(s) Performed: ESOPHAGOGASTRODUODENOSCOPY (EGD) WITH PROPOFOL (N/A ) FOREIGN BODY REMOVAL     Patient location during evaluation: Endoscopy Anesthesia Type: General Level of consciousness: awake and alert Pain management: pain level controlled Vital Signs Assessment: post-procedure vital signs reviewed and stable Respiratory status: spontaneous breathing, nonlabored ventilation, respiratory function stable and patient connected to nasal cannula oxygen Cardiovascular status: blood pressure returned to baseline and stable Postop Assessment: no apparent nausea or vomiting Anesthetic complications: no   No complications documented.  Last Vitals:  Vitals:   05/27/20 0930 05/27/20 0935  BP: 125/68 (!) 114/59  Pulse: 61 60  Resp: 14 12  Temp:    SpO2: 96% 99%    Last Pain:  Vitals:   05/27/20 0935  TempSrc:   PainSc: 0-No pain                 Juanangel Soderholm

## 2020-05-27 NOTE — Anesthesia Procedure Notes (Signed)
Procedure Name: Intubation Date/Time: 05/27/2020 8:04 AM Performed by: Myna Bright, CRNA Pre-anesthesia Checklist: Patient identified, Emergency Drugs available, Suction available and Patient being monitored Patient Re-evaluated:Patient Re-evaluated prior to induction Oxygen Delivery Method: Circle system utilized Preoxygenation: Pre-oxygenation with 100% oxygen Induction Type: IV induction, Rapid sequence and Cricoid Pressure applied Laryngoscope Size: Mac and 4 Grade View: Grade I Tube type: Oral Tube size: 7.5 mm Number of attempts: 1 Airway Equipment and Method: Stylet Placement Confirmation: ETT inserted through vocal cords under direct vision,  positive ETCO2 and breath sounds checked- equal and bilateral Secured at: 22 cm Tube secured with: Tape Dental Injury: Teeth and Oropharynx as per pre-operative assessment

## 2020-05-27 NOTE — Discharge Instructions (Signed)
YOU HAD AN ENDOSCOPIC PROCEDURE TODAY: Refer to the procedure report and other information in the discharge instructions given to you for any specific questions about what was found during the examination. If this information does not answer your questions, please call [place] office at [phone] to clarify.   YOU SHOULD EXPECT: Some feelings of bloating in the abdomen. Passage of more gas than usual. Walking can help get rid of the air that was put into your GI tract during the procedure and reduce the bloating. If you had a lower endoscopy (such as a colonoscopy or flexible sigmoidoscopy) you may notice spotting of blood in your stool or on the toilet paper. Some abdominal soreness may be present for a day or two, also.  DIET: Your first meal following the procedure should be a light meal and then it is ok to progress to your normal diet. A half-sandwich or bowl of soup is an example of a good first meal. Heavy or fried foods are harder to digest and may make you feel nauseous or bloated. Drink plenty of fluids but you should avoid alcoholic beverages for 24 hours. If you had a esophageal dilation, please see attached instructions for diet.    ACTIVITY: Your care partner should take you home directly after the procedure. You should plan to take it easy, moving slowly for the rest of the day. You can resume normal activity the day after the procedure however YOU SHOULD NOT DRIVE, use power tools, machinery or perform tasks that involve climbing or major physical exertion for 24 hours (because of the sedation medicines used during the test).   SYMPTOMS TO REPORT IMMEDIATELY: A gastroenterologist can be reached at any hour. Please call [phone]  for any of the following symptoms:   Following upper endoscopy (EGD, EUS, ERCP, esophageal dilation) Vomiting of blood or coffee ground material  New, significant abdominal pain  New, significant chest pain or pain under the shoulder blades  Painful or  persistently difficult swallowing  New shortness of breath  Black, tarry-looking or red, bloody stools  FOLLOW UP:  If any biopsies were taken you will be contacted by phone or by letter within the next 1-3 weeks. Call [phone]  if you have not heard about the biopsies in 3 weeks.  Please also call with any specific questions about appointments or follow up tests.  

## 2020-05-27 NOTE — Op Note (Signed)
[facility] Patient Name: Nathan Cross Procedure Date : 05/27/2020 MRN: [id number] Attending MD: Justice Britain , MD Date of Birth: 1951-02-28 CSN: [id number] Age: 69 Admit Type: Inpatient Procedure:                Upper GI endoscopy Indications:              Generalized abdominal pain, Abnormal UGI series,                            Stenosis of the duodenum, For therapy of duodenal                            stenosis Providers:                Justice Britain, MD, Kary Kos RN, RN,                            Lesia Sago, Technician, Cletis Athens,                            Technician, Barrett Henle, CRNA Referring MD:             Lajuan Lines. Pyrtle, MD Medicines:                Monitored Anesthesia Care initially and then                            converted to General Anesthesia due to foodstuffs                            in stomach and outlet obstruction Complications:            No immediate complications. Estimated Blood Loss:     Estimated blood loss was minimal. Procedure:                Pre-Anesthesia Assessment:                           - Prior to the procedure, a History and Physical                            was performed, and patient medications and                            allergies were reviewed. The patient's tolerance of                            previous anesthesia was also reviewed. The risks                            and benefits of the procedure and the sedation                            options and risks were discussed with the patient.  All questions were answered, and informed consent                            was obtained. Prior Anticoagulants: The patient has                            taken no previous anticoagulant or antiplatelet                            agents except for NSAID medication. ASA Grade                            Assessment: III - A patient with severe systemic                             disease. After reviewing the risks and benefits,                            the patient was deemed in satisfactory condition to                            undergo the procedure.                           After obtaining informed consent, the endoscope was                            passed under direct vision. Throughout the                            procedure, the patient's blood pressure, pulse, and                            oxygen saturations were monitored continuously. The                            GIF-H190 ([id number]) Olympus gastroscope was                            introduced through the mouth, and advanced to the                            duodenal bulb. The upper GI endoscopy was                            accomplished without difficulty. The patient                            tolerated the procedure. Scope In: Scope Out: Findings:      No gross lesions were noted in the entire esophagus.      The Z-line was irregular and was found 44 cm from the incisors.      A non-obstructing Schatzki ring was found at the gastroesophageal       junction.  A 3 cm hiatal hernia was present.      A large amount of food (residue) was found in the entire examined       stomach. Suction via Endoscope was performed. Removal of additional food       was accomplished with a roth net.      A dilated and J-shaped deformity was found in the entire examined       stomach in essence showing the outlet obstruction that is noted below.      Food (residue) was found in the duodenal bulb. Suction via Endoscope was       performed. Removal of food was accomplished with a Roth net.      An acquired benign-appearing, intrinsic severe stenosis was found in the       duodenal bulb and was non-traversed. Placement of a long 0.035 inch Soft       Jagwire was attempted. This passed successfully. I used a sphincterotome       and injected contrast to discern the length of the stenosis and this was        felt to be less than 2 cm in length. Over the T[place], a TTS dilator was       passed through the scope. Dilation with a 6-7-8 mm (up to 8 initially)       and an 02-01-09 mm (up to 10 mm) pyloric balloon dilator was performed       under fluoroscopic guidance. The dilation site was examined and showed       moderate mucosal disruption, moderate improvement in luminal narrowing       and no perforation. Oozing noted from the site of dilation. Impression:               - No gross lesions in esophagus proximally. Z-line                            irregular, 44 cm from the incisors. Non-obstructing                            Schatzki ring.                           - 3 cm hiatal hernia.                           - A large amount of food (residue) in the stomach.                            Removal was successful but not complete.                           - Acquired outlet obstrucion and J-shaped deformity                            in the entire stomach as a result of outlet                            obstruction.                           -  Retained food in the duodenum bulb. Removal was                            successful.                           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                            Dilated. Recommendation:           - The patient will be observed post-procedure,                            until all discharge criteria are met.                           - Discharge patient to home.                           - Patient has a contact number available for                            emergencies. The signs and symptoms of potential                            delayed complications were discussed with the                            patient. Return to normal activities tomorrow.                            Written discharge instructions were provided to the                            patient.                           - Clear liquid diet today then Low  residue diet.                           - No aspirin, ibuprofen, naproxen, or other                            non-steroidal anti-inflammatory drugs.                           - Increase Omeprazole to 40 mg twice daily.                           - May restart Eliquis in 72 hours (12/5 AM).                           - Repeat upper endoscopy in 3-4 weeks for  retreatment and further dilation. Clear liquid diet                            the day prior.                           - The findings and recommendations were discussed                            with the patient.                           - The findings and recommendations were discussed                            with the patient's family. Procedure Code(s):        --- Professional ---                           [phone], Esophagogastroduodenoscopy, flexible,                            transoral; with removal of foreign body(s)                           43245, Esophagogastroduodenoscopy, flexible,                            transoral; with dilation of gastric/duodenal                            stricture(s) (eg, balloon, bougie) Diagnosis Code(s):        --- Professional ---                           K22.8, Other specified diseases of esophagus                           K22.2, Esophageal obstruction                           K44.9, Diaphragmatic hernia without obstruction or                            gangrene                           T18.2XXA, Foreign body in stomach, initial encounter                           K31.89, Other diseases of stomach and duodenum                           T18.3XXA, Foreign body in small intestine, initial                            encounter  K31.5, Obstruction of duodenum                           R10.84, Generalized abdominal pain                           R93.3, Abnormal findings on diagnostic imaging of                            other parts of  digestive tract CPT copyright 2019 [employer]. All rights reserved. The codes documented in this report are preliminary and upon coder review may  be revised to meet current compliance requirements. Justice Britain, MD 05/27/2020 9:42:58 AM Number of Addenda: 0

## 2020-05-28 ENCOUNTER — Other Ambulatory Visit: Payer: Self-pay

## 2020-05-28 ENCOUNTER — Telehealth: Payer: Self-pay | Admitting: Gastroenterology

## 2020-05-28 ENCOUNTER — Telehealth: Payer: Self-pay

## 2020-05-28 DIAGNOSIS — R1310 Dysphagia, unspecified: Secondary | ICD-10-CM

## 2020-05-28 DIAGNOSIS — K315 Obstruction of duodenum: Secondary | ICD-10-CM

## 2020-05-28 DIAGNOSIS — K5900 Constipation, unspecified: Secondary | ICD-10-CM

## 2020-05-28 DIAGNOSIS — R140 Abdominal distension (gaseous): Secondary | ICD-10-CM

## 2020-05-28 NOTE — Telephone Encounter (Signed)
See alternate note 12/3

## 2020-05-28 NOTE — Telephone Encounter (Signed)
The pt has been advised to restart anticoag tomorrow morning. He was also asked about the EGD and he prefers to have the appt in 1/3 due to other appts he has the end of December. He will be mailed all the information to his home per his preference.  I will also send a letter to his cardiologist for anti coag response.

## 2020-05-28 NOTE — Telephone Encounter (Signed)
Nathan Cross, please reach out to the patient on 12/3.  Please let him know that I'm going to shorten the time.  Off his anticoagulation to decrease his risk of having problems from his A. fib.  Instead of restarting his anticoagulation on 12/5 AM he can now started on 12/4 AM.

## 2020-05-28 NOTE — Telephone Encounter (Signed)
I was able to speak with Dr. Westley Gambles of [facility] cardiology. He did not want the patient to come off anticoagulation therapy for approximately 3 months post procedure and this had been told previously. When he heard that the patient had stopped his anticoagulation he was not excited about the prospects of that but asked that we try and get the patient back on as quickly as possible. Patty, please reach out to the patient on 12/3.  Please let him know that I'm going to shorten the time.  Off his anticoagulation to decrease his risk of having problems from his A. fib.  Instead of restarting his anticoagulation on 12/5 AM he can now started on 12/4 AM. A separate note has been sent to Dr. Hilarie Fredrickson and yourself to let him know that a repeat dilation will be required and to help on scheduling that when you call him back. Please formally reach out to Dr. Rosalita Chessman office to get the approval for coming off anticoagulation for at least 1 day prior to repeat dilation to be scheduled on 21 December if possible if not at the beginning of the new year on 3 January. Thanks. GM

## 2020-05-28 NOTE — Telephone Encounter (Signed)
Anti coag has been sent to Dr Rosalita Chessman and [place].

## 2020-05-28 NOTE — Telephone Encounter (Signed)
-----   Message from Irving Copas., MD sent at 05/28/2020  4:00 AM EST ----- JMP and JZ, Dilated patient up to 10 mm.  Is going to require a few more dilations. Nathan Cross, please schedule this patient for 12/21 at 7:30 AM at [facility] long (one of my purple days). If that is not available and schedule him for the first available at the beginning of the new year which I believe would be an 830 case on 1/3 please place fluoroscopy availability on the notation. JMP, if you end up having anything sooner that can be done and feel comfortable you can take over otherwise we'll plan for what I have noted above. Nathan Cross you will need to get approval from the cardiology service again for him to come off anticoagulation for at least 1-2 days prior to procedure. Thanks. GM

## 2020-05-30 ENCOUNTER — Encounter (HOSPITAL_COMMUNITY): Payer: Self-pay | Admitting: Gastroenterology

## 2020-06-03 ENCOUNTER — Other Ambulatory Visit (HOSPITAL_COMMUNITY): Payer: Medicare Other

## 2020-06-23 ENCOUNTER — Encounter (HOSPITAL_COMMUNITY): Payer: Self-pay | Admitting: Gastroenterology

## 2020-06-23 ENCOUNTER — Other Ambulatory Visit: Payer: Self-pay

## 2020-06-23 NOTE — Progress Notes (Signed)
  COVID TEST- 06/25/20  Anesthesia review: cardiac hx  -------------  SDW INSTRUCTIONS:  Your procedure is scheduled on Monday 06/28/20. Please report to [facility] Main Entrance "A" at 07:00 A.M., and check in at the Admitting office. Call this number if you have problems the morning of surgery: [phone]   Remember: Do not eat or drink after midnight the night before your surgery Medications to take morning of surgery with a sip of water include: (per pt he received instructions from Mansouraty - pt instructed to follow surgeons instructions) amiodarone (PACERONE) gemfibrozil (LOPID)  levothyroxine (SYNTHROID)  icosapent Ethyl (VASCEPA) metoprolol succinate (TOPROL-XL) omeprazole (PRILOSEC)   If needed: acetaminophen (TYLENOL)  clonazePAM (KLONOPIN)  traMADol (ULTRAM)  metFORMIN (GLUCOPHAGE)  - none on 06/29/19 Monday   NOVOLOG FLEXPEN 06/28/19 - AM dose take as usual, no PM dose 06/29/19 - take 1.5 units ONLY if CBG >220  ** PLEASE check your blood sugar the morning of your surgery when you wake up and every 2 hours until you get to the Short Stay unit.  If your blood sugar is less than 70 mg/dL, you will need to treat for low blood sugar: - Do not take insulin. - Treat a low blood sugar (less than 70 mg/dL) with  cup of clear juice (cranberry or apple), 4 glucose tablets, OR glucose gel. - Recheck blood sugar in 15 minutes after treatment (to make sure it is greater than 70 mg/dL). If your blood sugar is not greater than 70 mg/dL on recheck, call [phone] for further instructions.  Follow your surgeon's instructions on when to stop XARELTO   If no instructions were given by your surgeon then you will need to call the office to get those instructions.    As of today, STOP taking any Aspirin (unless otherwise instructed by your surgeon), Aleve, Naproxen, Ibuprofen, Motrin, Advil, Goody's, BC's, all herbal medications, fish oil, and all vitamins.    The Morning of  Surgery Do not wear jewelry Do not wear lotions, powders, colognes, or deodorant Men may shave face and neck. Do not bring valuables to the hospital. [facility] is not responsible for any belongings or valuables. If you are a smoker, DO NOT Smoke 24 hours prior to surgery If you wear a CPAP at night please bring your mask the morning of surgery  Remember that you must have someone to transport you home after your surgery, and remain with you for 24 hours if you are discharged the same day. Please bring cases for contacts, glasses, hearing aids, dentures or bridgework because it cannot be worn into surgery.   Patients discharged the day of surgery will not be allowed to drive home.   Please shower the NIGHT BEFORE SURGERY and the MORNING OF SURGERY with DIAL Soap. Wear comfortable clothes the morning of surgery. Oral Hygiene is also important to reduce your risk of infection.  Remember - BRUSH YOUR TEETH THE MORNING OF SURGERY WITH YOUR REGULAR TOOTHPASTE  Patient denies shortness of breath, fever, cough and chest pain.

## 2020-06-24 ENCOUNTER — Other Ambulatory Visit (HOSPITAL_COMMUNITY): Admission: RE | Admit: 2020-06-24 | Payer: Medicare Other | Source: Ambulatory Visit

## 2020-06-24 ENCOUNTER — Telehealth: Payer: Self-pay

## 2020-06-24 NOTE — Progress Notes (Signed)
Talked with patient regarding procedure on Monday 1/3. Pt understands that he needs to quarantine over the weekend and that if he does not he will need to come 2.5 hours early for a rapid test piror to the procedure. Pt aware he will be called again on Sunday for pre-procedure Covid screening.  Pt verbalized understanding.

## 2020-06-24 NOTE — Anesthesia Preprocedure Evaluation (Addendum)
Anesthesia Evaluation  Patient identified by MRN, date of birth, ID band Patient awake    Reviewed: Allergy & Precautions, NPO status , Patient's Chart, lab work & pertinent test results  History of Anesthesia Complications Negative for: history of anesthetic complications  Airway Mallampati: II  TM Distance: >3 FB Neck ROM: Full    Dental   Pulmonary sleep apnea and Continuous Positive Airway Pressure Ventilation , COPD, Patient abstained from smoking., former smoker,    Pulmonary exam normal        Cardiovascular hypertension, + CAD, + Past MI, + CABG (2004) and +CHF  Normal cardiovascular exam+ dysrhythmias (s/p ablation 05/05/20) Atrial Fibrillation      Neuro/Psych negative neurological ROS  negative psych ROS   GI/Hepatic Neg liver ROS, GERD  ,Duodenal stricture   Endo/Other  diabetes, Type 2Hypothyroidism   Renal/GU Renal disease (solitary kidney)  negative genitourinary   Musculoskeletal negative musculoskeletal ROS (+)   Abdominal   Peds  Hematology negative hematology ROS (+)   Anesthesia Other Findings   Reproductive/Obstetrics                           Anesthesia Physical Anesthesia Plan  ASA: III  Anesthesia Plan: General   Post-op Pain Management:    Induction: Intravenous, Rapid sequence and Cricoid pressure planned  PONV Risk Score and Plan: 2 and Ondansetron, Dexamethasone, Treatment may vary due to age or medical condition and Midazolam  Airway Management Planned: Oral ETT  Additional Equipment: None  Intra-op Plan:   Post-operative Plan: Extubation in OR  Informed Consent: I have reviewed the patients History and Physical, chart, labs and discussed the procedure including the risks, benefits and alternatives for the proposed anesthesia with the patient or authorized representative who has indicated his/her understanding and acceptance.     Dental advisory  given  Plan Discussed with:   Anesthesia Plan Comments:       Anesthesia Quick Evaluation

## 2020-06-24 NOTE — Progress Notes (Signed)
Anesthesia Chart Review: Nathan Cross    Case: [id number] Date/Time: 06/28/20 0830   Procedure: ESOPHAGOGASTRODUODENOSCOPY (EGD) WITH PROPOFOL (N/A ) - Fluoro    Anesthesia type: Monitor Anesthesia Care   Pre-op diagnosis: dysphagia   Location: [facility] ROOM 1 / [place] [facility]   Surgeons: Mansouraty, Telford Nab., MD      DISCUSSION: Patient is a 69 year old male scheduled for the above procedure. He is s/p EGD with dilation of duodenal stenosis for outlet obstruction on 05/27/20. He was dilated up to 10 mm, but would require a few more dilations and is now scheduled for 06/28/20.     History includes former smoker (quit 02/25/20), CAD (s/p CABG ~ 2004 in [place], [place]; by notes in [place], "LIMA-to-LAD, SVG-to-RPDA, SVG-to-OM, he does have a L radial artery graft"; s/p DES to SVG-RPDA ~ 2012; inferior STEMI with mild LV dysfunction 45% with occluded DES at SVG-RPDA in setting of not being on DAPT due to GI bleed, no revascularized 03/2014), PAF (s/p afib ablation 05/05/20; s/p unsuccessful DCCV 05/06/20), OSA (CPAP), HTN, hypothyroidism, left prosthetic eye, back surgery (L1-4 anterolateral fusion 06/10/19). 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 BMI is consistent with obesity.  On 02/26/20, cardiologist Dr. Rosalita Chessman cleared patient to hold Xarelto for 24 hours prior to EGD. Since then Dr. Rosalita Chessman referred Mr. Poague to EP cardiologist Dr. Westley Gambles with [facility] (but also sees patient at the [facility] office). He was referred for paroxysmal/persistent afib with AT/AF 48% burden on ambulatory EKG monitoring despite amiodarone and with associated symptoms of palpitations and decreased exercise tolerance. He underwent afib ablation 05/05/20 and unsuccessful DCCV 05/06/20. Ideally he had wanted patient to continue Xarelto for 3 months; however, Xarelto held for 05/27/20 duodenal dilation. Dr. [name] spoke directed with Dr. Westley Gambles who asked  him to restart anticoagulation post-procedure ASAP to minimize time off. When I spoke with patient prior to 05/27/20 EGD, he reported overall improvement in frequency of afib since undergoing ablation, but still having breakthrough episodes. He is on Amiodarone and Toprol; however, he feels amiodarone makes him feel dizzy. He notified Dr. Rosalita Chessman office on 06/17/20 and was told that if he feels like he needs to be seen prior to 07/02/20 then to contact Dr. Milta Deiters office. Patty with Dr. Donneta Romberg office followed up with Mr. Senn on 06/24/20 and he reported "He is taking amiodarone for A fib with good control although the medication has caused some dizziness in the past." Patient reported he was given permission to hold Xarelto for EGD, and Patty is attempting to follow-up with Dr. Rosalita Chessman.   Preprocedure COVID-19 test is scheduled for 06/24/20.    VS: Ht 6\' 3"  (1.905 m)   Wt 115.7 kg   BMI 31.87 kg/m   BP Readings from Last 3 Encounters:  05/27/20 (!) 114/59  03/25/20 (!) 109/48  01/29/20 110/74   Pulse Readings from Last 3 Encounters:  05/27/20 60  03/25/20 (!) 50  01/29/20 77    PROVIDERS: Frances Maywood, FNP is PCP  Delanna Notice, MD is cardiologist ([place]). It appears that previously he was followed by [facility] in from 10/30/14-08/09/15 (see Care Everywhere). Fatima Sanger, MD is EP cardiologist   LABS: Per GI/Anesthesia.  OTHER: EGD 05/27/20: IMPRESSION: - No gross lesions in esophagus proximally. Z-line irregular, 44 cm from the incisors. Nonobstructing Schatzki ring. - 3 cm hiatal hernia. - A large amount of food (residue)  in the stomach. Removal was successful but not complete. - Acquired outlet obstruction and J-shaped deformity in the entire stomach as a result of outlet obstruction. - Retained food in the duodenum bulb. Removal was successful. 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 Dilated.   IMAGES: CXR 05/04/20  ([facility] CE): FINDINGS/IMPRESSION:   1. Mediastinal and cardiac contours are within normal limits. Status post  median sternotomy.  2. There are scattered bilateral opacities favored represent atelectasis.  3. No large effusion or pneumothorax.  4. Degenerative changes of the thoracic spine.    EKG:  EKG 05/06/20: Per result narrative [facility] CE: Atrial fibrillation at 80 bpm RSR' in V1 or V2  Prolonged QT  Abnormal ECG  When compared with ECG of 06 May 2020 04:57  No significant change was found   EKG 03/15/20 ([facility]; scanned under Media tab, Correspondence 05/27/20):  Sinus bradycardia at 52 bpm RSR (V1), non-diagnostic   CV: Cardiac MRI/MRA 05/04/20: Cardiac MRI:  1. The left ventricle is moderatelyenlarged in cavity size and there is mild eccentric LV wall hypertrophy. Global systolic function is normal. The LV ejection fraction is 63%. The basal and midventricular inferior wall is moderately hypokinetic.  2. The right ventricle is normal in cavity size, wall thickness, and systolic function.  3.The left atrium is moderately enlarged, the right atrium is mildly enlarged in size.  4. The aortic valve is trileaflet in morphology. There is no significant aortic valve stenosis or regurgitation. There is mild mitral regurgitation.  5. Delayed enhancement imaging is abnormal. There is a subendocardial infarct in the inferior wall from base to apical levels consistent with the RCA/PDA perfusion territory.  6. No intracardiac thrombus visualized.  Thoracic MRA:  1. There are four pulmonary veins entering the left atrium (two on the right and two on the left). All veins are patent without significant stenosis proximally. The bi-orthogonal luminal dimensions are listed below:   RUPV: 2.6 x 2.1 cm  RLPV: 2.1 x 1.5 cm  LUPV: 2.3 x 1.6 cm  LLPV: 2.5 x 1.4 cm   LA measurements:  Right-Left: 7.6 cm  Anterior-Posterior: 4.7 cm  Head-Foot: 6.6 cm   2. The thoracic aorta  is normal in diameter. There is no dissection seen.  3. The main and proximal branch pulmonary arteries are normal in size  4. Systemic venous connections are normal.  5. The esophagus traverses posterior of the left atrium in close proximity to the ostia of the left-sided pulmonary veins.   CONCLUSION.  NormalLVEF but with moderate inferior hypokinesis and a subendocardial infarct in the inferior wall.    [place] 01/28/20 (as outlined in Dr. Otho Najjar 03/15/20 note in [place]): minHR-41, avgHR-59, maxHR-97bpm, PAC<1%, PVC<1%, AT/AF w/ RVR up to 166bpm (AT/AF=58%, +Sx during AFib with VR=71bpml averageVR in AT/AF=88bpm, VRrange in AT/AF=49-166bpm), no prolonged pauses    Nuclear stress test 05/06/19([facility], scanned under Media tab, Correspondence 05/27/20): Perfusion imaging: There is a moderate sized fixed perfusion abnormality of moderate intensity in the inferior wall from the base to the apex. The remainder the ventricle has normal perfusion at rest and with stress. Wall motion: There is hypokinesis in the inferior wall from the base to the apex. The remainder the ventricle functions normally. The LV cavity size is normal at rest and with stress, is unchanged. The ejection fraction is 69%. Overall: This vasodilator stress test is negative for ischemia, but with evidence of infarction. LVEF 69%. (Comparison NST 11/10/14 in [facility] [id number] myocardial infarction with  peri-infarct ischemia, EF 62%)   Echo 11/22/17 ([facility], scanned under Media tab, Correspondence 05/27/20): Impression: Normal LV size with borderline normal function.  The ejection fraction is 50 to 55%.  There is mild left ventricular hypertrophy. Mildly dilated right ventricle. Mildly dilated left atrium. Mildly dilated right atrium. Normal, trileaflet aortic valve.  There is a normal aortic valve area. Normal mitral valve. There is mild tricuspid regurgitation.   RHC/LHC 11/09/17 ([facility];  scanned under Media tab, Correspondence 06/10/19): Findings: 1.  LMCA: Normal 2.  LAD: 80% proximal 3.  Circumflex: 100% ostial 4.  RCA: 100% proximal 5.  Saphenous vein graft to RCA: Occluded 6.  Saphenous vein graft to OM: Patent 7.  LIMA to LAD: Patent 8.  There are left-to-left and left-to-right collaterals noted filling the distal PDA and distal circumflex/3rd obtuse marginal system. 9.  LVEDP: 12 mmHg 10.  RCA PCWP: 10 mmHg PA: 03/17/12 mmHg RV: 29/6 mmHg RA: 4 mmHg CO: 4.6 L/min CI: 1.81 L/min/m Recommendations: Medical management of dyspnea the diet and exercise advised.   Past Medical History:  Diagnosis Date  . Adenomatous colon polyp 2017  . Anxiety   . CHF (congestive heart failure) ([place])   . COPD (chronic obstructive pulmonary disease) ([facility])    past hx - low end   . Coronary artery disease   . Diabetes mellitus without complication ([place])    type 2  . Diverticulosis   . 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 Dysphagia 04/2020  . Dysrhythmia    Paroxysmal atrial fibrillation   . ED (erectile dysfunction)   . GERD (gastroesophageal reflux disease)   . History of eye prosthesis    Left  . Hyperlipidemia    controlled on meds   . Hypertension   . Hypothyroidism   . 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 Myocardial infarction ([place])   . PAF (paroxysmal atrial fibrillation) ([place])   . Sleep apnea    wears cpap     Past Surgical History:  Procedure Laterality Date  . ANTERIOR LAT LUMBAR FUSION N/A 06/10/2019   Procedure: Lumbar one-two , Lumbar two-three, Lumbar three-four- Anterolateral decompression/fusion with percutaneous pedicle screw fixation;  Surgeon: Erline Levine, MD;  Location: [place];  Service: Neurosurgery;  Laterality: N/A;  . BALLOON DILATION N/A 05/27/2020   Procedure: BALLOON DILATION;  Surgeon: [name] Telford Nab., MD;  Location: [place];  Service: Gastroenterology;  Laterality: N/A;  . CARDIAC  CATHETERIZATION  11/09/2017  . COLONOSCOPY    . CORONARY ARTERY BYPASS GRAFT  2004   CABG x 4   . ELECTROPHYSIOLOGIC STUDY  05/05/2020   [facility] - ABLATION A-fib pulm vein    . ESOPHAGOGASTRODUODENOSCOPY (EGD) WITH PROPOFOL N/A 05/27/2020   Procedure: ESOPHAGOGASTRODUODENOSCOPY (EGD) WITH PROPOFOL;  Surgeon: [name] Telford Nab., MD;  Location: [place];  Service: Gastroenterology;  Laterality: N/A;  . FOREIGN BODY REMOVAL  05/27/2020   Procedure: FOREIGN BODY REMOVAL;  Surgeon: [name] Telford Nab., MD;  Location: [place];  Service: Gastroenterology;;  . HERNIA REPAIR    . KIDNEY DONATION Right 1996  . LUMBAR PERCUTANEOUS PEDICLE SCREW 3 LEVEL N/A 06/10/2019   Procedure: LUMBAR PERCUTANEOUS PEDICLE SCREW 3 LEVEL;  Surgeon: Erline Levine, MD;  Location: [place];  Service: Neurosurgery;  Laterality: N/A;  . MEDIAL PARTIAL KNEE REPLACEMENT Bilateral   . POLYPECTOMY    . Prosthetic Eye Left   . TONSILLECTOMY     as child   . UPPER GASTROINTESTINAL  ENDOSCOPY      MEDICATIONS: No current facility-administered medications for this encounter.   Marland Kitchen acetaminophen (TYLENOL) 650 MG CR tablet  . amiodarone (PACERONE) 200 MG tablet  . Cholecalciferol (VITAMIN D3 PO)  . clobetasol cream (TEMOVATE) 0.05 %  . clonazePAM (KLONOPIN) 0.5 MG tablet  . furosemide (LASIX) 20 MG tablet  . gemfibrozil (LOPID) 600 MG tablet  . icosapent Ethyl (VASCEPA) 1 g capsule  . levothyroxine (SYNTHROID) 50 MCG tablet  . lisinopril (ZESTRIL) 5 MG tablet  . metFORMIN (GLUCOPHAGE) 500 MG tablet  . metoprolol succinate (TOPROL-XL) 50 MG 24 hr tablet  . nitroGLYCERIN (NITROSTAT) 0.4 MG SL tablet  . NOVOLOG FLEXPEN 100 UNIT/ML FlexPen  . omeprazole (PRILOSEC) 40 MG capsule  . Probiotic Product (PROBIOTIC DIGESTIVE SUPP PO)  . rosuvastatin (CRESTOR) 20 MG tablet  . sildenafil (REVATIO) 20 MG tablet  . traMADol (ULTRAM) 50 MG tablet  . vitamin C (ASCORBIC ACID) 500 MG tablet  . XARELTO 20 MG TABS tablet     Shonna Chock, PA-C Surgical Short Stay/Anesthesiology [facility] Phone [phone] [facility] Phone [phone] 06/24/2020 2:11 PM

## 2020-06-24 NOTE — Telephone Encounter (Signed)
The pt has the ok to hold anti coag per Dr Earna Coder in [place] per the pt and his wife.  He has no problems at this time. He is taking amiodarone for A fib with good control although the medication has caused some dizziness in the past.  I have placed several calls to Dr Earna Coder in [place] with no return call.

## 2020-06-24 NOTE — Telephone Encounter (Signed)
----- Message from Jerold Coombe, PA-C sent at 06/23/2020  5:48 PM EST ----- Regarding: RE: EGD 1/3 Dr. Meridee Score, I see that Mr. Morgenthaler is back on the scheduled for an EGD for 06/28/20.  This is the gentlemen with afib ablation in November whose cardiologist did not want him to stop Xarelto for 3 months.  Did you get new cardiology input? Notes in Care Everywhere from 06/17/20 indicate he was having more breakthrough afib.  Shonna Chock, PA-C Surgical Short Stay/Anesthesiology [facility] Phone [phone] 06/23/2020 5:59 PM      ----- Message ----- From: Lemar Lofty., MD Sent: 05/28/2020   4:06 AM EST To: Jerold Coombe, PA-C Subject: RE: EGD 12/2                                   Thanks for that follow-up.  I also spoke with Dr. Christin Fudge on 12/3.  He was not excited that he held his anticoagulation without telling him but understood and asked that we restart as quick as possible.Thanks for your great preop each time.He actually did very well during his procedure.Thanks.GM ----- Message ----- From: Elizebeth Koller Sent: 05/27/2020   1:39 PM EST To: Lemar Lofty., MD Subject: RE: EGD 12/2                                   Dr. Meridee Score,  I finally got a call back this afternoon from Dr. Eldridge Scot office. I was on the other line, so it was a voice message from [place] [phone]).  She could see in Care Everywhere that he had stopped his Xarelto for EGD although she says that he had been asked not to stop Xarelto for 3 months.  Since he had already stopped it, she just added to make sure he restarts it asap.   Revonda Standard   ----- Message ----- From: Lemar Lofty., MD Sent: 05/26/2020   1:41 PM EST To: Jerold Coombe, PA-C, Loretha Stapler, RN, # Subject: RE: EGD 12/2                                   Thanks for looking into this. I'm not sure. If his DCCV was not successful, and he still has Afib, then I'm not sure there is much of a  timetable in regards to need for continued anticoagulation if he never got himself out of it. Please let us know if you find anything else. He was to hold anticoagulation for our procedure so we should have received approval for this. I'll place my team on this to see if they have any other information about this patient. Thanks. [name] and Raunel Dimartino, do you have any more information about this patient and the approval from Cardiology to come off his anticoagulation for his procedure? Thanks. GM ----- Message ----- From: Elizebeth Koller Sent: 05/26/2020  12:32 PM EST To: Lemar Lofty., MD Subject: EGD 12/2                                       Dr. Meridee Score, Mr. Beamer is for EGD 05/27/20. Staff are calling now to get his last cardiology  records from [place] (Dr. Rosalita Chessman, EP Dr. Westley Gambles), but it looks like since his last GI procedure he underwent afib ablation 05/05/20 then unsuccessful cardioversion on 05/06/20. I'm not sure if there was a specific timeframe he was to continue Xarelto post ablation/DCCV, but he did report last dose 05/24/20.   I will be out of the office for the next hour or so, but I'm hoping to have records when I return.  Karoline Caldwell, PA-C can be reached at my office number if needed.)  I was just wondering if you had any info that I don't have, otherwise I can let you know if I find out anything.    Thanks,   Myra Gianotti, PA-C Surgical Short Stay/Anesthesiology [facility] Phone [phone] 05/26/2020 12:49 PM

## 2020-06-24 NOTE — Telephone Encounter (Signed)
----- Message from Lemar Lofty., MD sent at 06/24/2020  4:01 AM EST ----- Regarding: RE: EGD 1/3 Revonda Standard, Thanks for reaching out.  I actually spoke directly to the patient's cardiologist the day of his procedure being completed last time just to let him know about the need for anticoagulation he had not wanted him to stop his anticoagulation but since he did he excepted that was what was going to happen and so I put him on his anticoagulation sooner than I normally would after a dilation but seems like he has done well.  This is the first time that I am hearing about any new issues and I see the notation in the chart that he was going to reach out to his [facility] cardiologist to see if he needed to have a cardioversion or anything else performed.  Enzley Kitchens, since he is scheduled for next Monday procedures, I need you to reach out to him and find out if his A. fib is better or if he is seeing his doctors in [place] about his A. fib issues.  If he is having more issues and they may not feel comfortable with him coming off anticoagulation for his procedure and holding it as long as we did the last time either.  So please try to reach out to him on Thursday.  If he is doing fine and better than okay to proceed with his procedures but otherwise if he is having episodes and having any RVR then may need to postpone until he is seen by cardiology.  Please update myself and Revonda Standard to what ever you find out.  Or Revonda Standard, a few reach out and talk with him sooner please let us know.  After Thursday our office is closed so we will have to try to make a decision today. Thanks. GM ----- Message ----- From: Jerold Coombe, PA-C Sent: 06/23/2020   6:00 PM EST To: Loretha Stapler, RN, Lemar Lofty., MD Subject: RE: EGD 1/3                                    Dr. Meridee Score, I see that Mr. Wimberly is back on the scheduled for an EGD for 06/28/20.  This is the gentlemen with afib ablation in November whose  cardiologist did not want him to stop Xarelto for 3 months.  Did you get new cardiology input? Notes in Care Everywhere from 06/17/20 indicate he was having more breakthrough afib.  Shonna Chock, PA-C Surgical Short Stay/Anesthesiology [facility] Phone [phone] 06/23/2020 5:59 PM      ----- Message ----- From: Lemar Lofty., MD Sent: 05/28/2020   4:06 AM EST To: Jerold Coombe, PA-C Subject: RE: EGD 12/2                                   Thanks for that follow-up.  I also spoke with Dr. Christin Fudge on 12/3.  He was not excited that he held his anticoagulation without telling him but understood and asked that we restart as quick as possible.Thanks for your great preop each time.He actually did very well during his procedure.Thanks.GM ----- Message ----- From: Elizebeth Koller Sent: 05/27/2020   1:39 PM EST To: Lemar Lofty., MD Subject: RE: EGD 12/2  Dr. Meridee Score,  I finally got a call back this afternoon from Dr. Eldridge Scot office. I was on the other line, so it was a voice message from [place] [phone]).  She could see in Care Everywhere that he had stopped his Xarelto for EGD although she says that he had been asked not to stop Xarelto for 3 months.  Since he had already stopped it, she just added to make sure he restarts it asap.   Revonda Standard   ----- Message ----- From: Lemar Lofty., MD Sent: 05/26/2020   1:41 PM EST To: Jerold Coombe, PA-C, Loretha Stapler, RN, # Subject: RE: EGD 12/2                                   Thanks for looking into this. I'm not sure. If his DCCV was not successful, and he still has Afib, then I'm not sure there is much of a timetable in regards to need for continued anticoagulation if he never got himself out of it. Please let us know if you find anything else. He was to hold anticoagulation for our procedure so we should have received approval for this. I'll place my team  on this to see if they have any other information about this patient. Thanks. [name] and Cheetara Hoge, do you have any more information about this patient and the approval from Cardiology to come off his anticoagulation for his procedure? Thanks. GM ----- Message ----- From: Elizebeth Koller Sent: 05/26/2020  12:32 PM EST To: Lemar Lofty., MD Subject: EGD 12/2                                       Dr. Meridee Score, Mr. Pipe is for EGD 05/27/20. Staff are calling now to get his last cardiology records from [facility] (Dr. Rockne Menghini, EP Dr. Christin Fudge), but it looks like since his last GI procedure he underwent afib ablation 05/05/20 then unsuccessful cardioversion on 05/06/20. I'm not sure if there was a specific timeframe he was to continue Xarelto post ablation/DCCV, but he did report last dose 05/24/20.   I will be out of the office for the next hour or so, but I'm hoping to have records when I return.  Antionette Poles, PA-C can be reached at my office number if needed.)  I was just wondering if you had any info that I don't have, otherwise I can let you know if I find out anything.    Thanks,   Shonna Chock, PA-C Surgical Short Stay/Anesthesiology [facility] Phone [phone] 05/26/2020 12:49 PM

## 2020-06-25 ENCOUNTER — Other Ambulatory Visit (HOSPITAL_COMMUNITY)
Admission: RE | Admit: 2020-06-25 | Discharge: 2020-06-25 | Disposition: A | Discharge status: Home / Self Care | Payer: Medicare Other | Source: Ambulatory Visit | Attending: Gastroenterology | Admitting: Gastroenterology

## 2020-06-25 DIAGNOSIS — K315 Obstruction of duodenum: Secondary | ICD-10-CM | POA: Diagnosis not present

## 2020-06-25 DIAGNOSIS — Z79899 Other long term (current) drug therapy: Secondary | ICD-10-CM | POA: Diagnosis not present

## 2020-06-25 DIAGNOSIS — I110 Hypertensive heart disease with heart failure: Secondary | ICD-10-CM | POA: Diagnosis not present

## 2020-06-25 DIAGNOSIS — Z20822 Contact with and (suspected) exposure to covid-19: Secondary | ICD-10-CM | POA: Diagnosis not present

## 2020-06-25 DIAGNOSIS — Z794 Long term (current) use of insulin: Secondary | ICD-10-CM | POA: Diagnosis not present

## 2020-06-25 DIAGNOSIS — I2510 Atherosclerotic heart disease of native coronary artery without angina pectoris: Secondary | ICD-10-CM | POA: Diagnosis not present

## 2020-06-25 DIAGNOSIS — T182XXA Foreign body in stomach, initial encounter: Secondary | ICD-10-CM | POA: Diagnosis not present

## 2020-06-25 DIAGNOSIS — K219 Gastro-esophageal reflux disease without esophagitis: Secondary | ICD-10-CM | POA: Diagnosis not present

## 2020-06-25 DIAGNOSIS — X58XXXA Exposure to other specified factors, initial encounter: Secondary | ICD-10-CM | POA: Diagnosis not present

## 2020-06-25 DIAGNOSIS — K3189 Other diseases of stomach and duodenum: Secondary | ICD-10-CM | POA: Diagnosis not present

## 2020-06-25 DIAGNOSIS — I509 Heart failure, unspecified: Secondary | ICD-10-CM | POA: Diagnosis not present

## 2020-06-25 DIAGNOSIS — Z7901 Long term (current) use of anticoagulants: Secondary | ICD-10-CM | POA: Diagnosis not present

## 2020-06-25 DIAGNOSIS — K831 Obstruction of bile duct: Secondary | ICD-10-CM | POA: Diagnosis not present

## 2020-06-25 DIAGNOSIS — J449 Chronic obstructive pulmonary disease, unspecified: Secondary | ICD-10-CM | POA: Diagnosis not present

## 2020-06-25 DIAGNOSIS — E119 Type 2 diabetes mellitus without complications: Secondary | ICD-10-CM | POA: Diagnosis not present

## 2020-06-25 DIAGNOSIS — K449 Diaphragmatic hernia without obstruction or gangrene: Secondary | ICD-10-CM | POA: Diagnosis not present

## 2020-06-25 DIAGNOSIS — I252 Old myocardial infarction: Secondary | ICD-10-CM | POA: Diagnosis not present

## 2020-06-25 DIAGNOSIS — I480 Paroxysmal atrial fibrillation: Secondary | ICD-10-CM | POA: Diagnosis not present

## 2020-06-25 LAB — SARS CORONAVIRUS 2 (TAT 6-24 HRS): SARS Coronavirus 2: NEGATIVE

## 2020-06-27 NOTE — Telephone Encounter (Signed)
Thank you for update.  I will see him for his upcoming procedure.  Thanks.

## 2020-06-27 NOTE — Progress Notes (Signed)
Talked with patient and he stated he quarantined all weekend and has not had any symptoms and has not been around anyone who has been sick. Pt stated understanding for tomorrow and will check in at 0700 for his 0830 procedure.

## 2020-06-28 ENCOUNTER — Ambulatory Visit (HOSPITAL_COMMUNITY)
Admission: RE | Admit: 2020-06-28 | Discharge: 2020-06-28 | Disposition: A | Discharge status: Home / Self Care | Payer: Medicare Other | Attending: Gastroenterology | Admitting: Gastroenterology

## 2020-06-28 ENCOUNTER — Encounter (HOSPITAL_COMMUNITY)
Admission: RE | Disposition: A | Discharge status: Home / Self Care | Payer: Self-pay | Source: Home / Self Care | Attending: Gastroenterology

## 2020-06-28 ENCOUNTER — Ambulatory Visit (HOSPITAL_COMMUNITY): Payer: Medicare Other | Admitting: Vascular Surgery

## 2020-06-28 ENCOUNTER — Ambulatory Visit (HOSPITAL_COMMUNITY): Payer: Medicare Other

## 2020-06-28 ENCOUNTER — Encounter (HOSPITAL_COMMUNITY): Payer: Self-pay | Admitting: Gastroenterology

## 2020-06-28 DIAGNOSIS — Z7901 Long term (current) use of anticoagulants: Secondary | ICD-10-CM | POA: Insufficient documentation

## 2020-06-28 DIAGNOSIS — K449 Diaphragmatic hernia without obstruction or gangrene: Secondary | ICD-10-CM | POA: Insufficient documentation

## 2020-06-28 DIAGNOSIS — I110 Hypertensive heart disease with heart failure: Secondary | ICD-10-CM | POA: Insufficient documentation

## 2020-06-28 DIAGNOSIS — I252 Old myocardial infarction: Secondary | ICD-10-CM | POA: Insufficient documentation

## 2020-06-28 DIAGNOSIS — J449 Chronic obstructive pulmonary disease, unspecified: Secondary | ICD-10-CM | POA: Diagnosis not present

## 2020-06-28 DIAGNOSIS — Z79899 Other long term (current) drug therapy: Secondary | ICD-10-CM | POA: Insufficient documentation

## 2020-06-28 DIAGNOSIS — K3189 Other diseases of stomach and duodenum: Secondary | ICD-10-CM | POA: Diagnosis not present

## 2020-06-28 DIAGNOSIS — I509 Heart failure, unspecified: Secondary | ICD-10-CM | POA: Insufficient documentation

## 2020-06-28 DIAGNOSIS — K219 Gastro-esophageal reflux disease without esophagitis: Secondary | ICD-10-CM | POA: Diagnosis not present

## 2020-06-28 DIAGNOSIS — T182XXA Foreign body in stomach, initial encounter: Secondary | ICD-10-CM

## 2020-06-28 DIAGNOSIS — K222 Esophageal obstruction: Secondary | ICD-10-CM

## 2020-06-28 DIAGNOSIS — X58XXXA Exposure to other specified factors, initial encounter: Secondary | ICD-10-CM | POA: Insufficient documentation

## 2020-06-28 DIAGNOSIS — I2510 Atherosclerotic heart disease of native coronary artery without angina pectoris: Secondary | ICD-10-CM | POA: Insufficient documentation

## 2020-06-28 DIAGNOSIS — E119 Type 2 diabetes mellitus without complications: Secondary | ICD-10-CM | POA: Diagnosis not present

## 2020-06-28 DIAGNOSIS — Z20822 Contact with and (suspected) exposure to covid-19: Secondary | ICD-10-CM | POA: Diagnosis not present

## 2020-06-28 DIAGNOSIS — Z794 Long term (current) use of insulin: Secondary | ICD-10-CM | POA: Insufficient documentation

## 2020-06-28 DIAGNOSIS — K831 Obstruction of bile duct: Secondary | ICD-10-CM | POA: Diagnosis not present

## 2020-06-28 DIAGNOSIS — I480 Paroxysmal atrial fibrillation: Secondary | ICD-10-CM | POA: Insufficient documentation

## 2020-06-28 DIAGNOSIS — K315 Obstruction of duodenum: Secondary | ICD-10-CM | POA: Diagnosis not present

## 2020-06-28 HISTORY — PX: BIOPSY: SHX5522

## 2020-06-28 HISTORY — PX: BALLOON DILATION: SHX5330

## 2020-06-28 HISTORY — PX: FOREIGN BODY REMOVAL: SHX962

## 2020-06-28 HISTORY — PX: ESOPHAGOGASTRODUODENOSCOPY (EGD) WITH PROPOFOL: SHX5813

## 2020-06-28 LAB — GLUCOSE, CAPILLARY: Glucose-Capillary: 109 mg/dL — ABNORMAL HIGH (ref 70–99)

## 2020-06-28 SURGERY — ESOPHAGOGASTRODUODENOSCOPY (EGD) WITH PROPOFOL
Anesthesia: General

## 2020-06-28 MED ORDER — SUCCINYLCHOLINE CHLORIDE 20 MG/ML IJ SOLN
INTRAMUSCULAR | Status: DC | PRN
  Administered 2020-06-28: 140 mg via INTRAVENOUS

## 2020-06-28 MED ORDER — ONDANSETRON HCL 4 MG/2ML IJ SOLN
INTRAMUSCULAR | Status: DC | PRN
  Administered 2020-06-28: 4 mg via INTRAVENOUS

## 2020-06-28 MED ORDER — PROPOFOL 10 MG/ML IV BOLUS
INTRAVENOUS | Status: DC | PRN
  Administered 2020-06-28: 180 mg via INTRAVENOUS
  Administered 2020-06-28: 20 mg via INTRAVENOUS

## 2020-06-28 MED ORDER — IOHEXOL 300 MG/ML  SOLN
INTRAMUSCULAR | Status: DC | PRN
Start: 2020-06-28 — End: 2020-06-28
  Administered 2020-06-28: 10 mL

## 2020-06-28 MED ORDER — FENTANYL CITRATE (PF) 100 MCG/2ML IJ SOLN
INTRAMUSCULAR | Status: AC
  Filled 2020-06-28: qty 2

## 2020-06-28 MED ORDER — LIDOCAINE 2% (20 MG/ML) 5 ML SYRINGE
INTRAMUSCULAR | Status: DC | PRN
  Administered 2020-06-28: 100 mg via INTRAVENOUS

## 2020-06-28 MED ORDER — LACTATED RINGERS IV SOLN: INTRAVENOUS | Status: DC | PRN

## 2020-06-28 MED ORDER — EPHEDRINE SULFATE-NACL 50-0.9 MG/10ML-% IV SOSY
PREFILLED_SYRINGE | INTRAVENOUS | Status: DC | PRN
  Administered 2020-06-28 (×2): 10 mg via INTRAVENOUS
  Administered 2020-06-28: 5 mg via INTRAVENOUS
  Administered 2020-06-28: 10 mg via INTRAVENOUS
  Administered 2020-06-28 (×2): 5 mg via INTRAVENOUS

## 2020-06-28 MED ORDER — GLYCOPYRROLATE PF 0.2 MG/ML IJ SOSY
PREFILLED_SYRINGE | INTRAMUSCULAR | Status: DC | PRN
  Administered 2020-06-28: .4 mg via INTRAVENOUS

## 2020-06-28 MED ORDER — FENTANYL CITRATE (PF) 100 MCG/2ML IJ SOLN
INTRAMUSCULAR | Status: DC | PRN
Start: 2020-06-28 — End: 2020-06-28
  Administered 2020-06-28 (×2): 50 ug via INTRAVENOUS

## 2020-06-28 MED ORDER — ATROPINE SULFATE 0.4 MG/ML IV SOSY
PREFILLED_SYRINGE | INTRAVENOUS | Status: DC | PRN
  Administered 2020-06-28 (×3): .08 mg via INTRAVENOUS

## 2020-06-28 SURGICAL SUPPLY — 14 items

## 2020-06-28 NOTE — Transfer of Care (Signed)
Immediate Anesthesia Transfer of Care Note  Patient: Nathan Cross  Procedure(s) Performed: ESOPHAGOGASTRODUODENOSCOPY (EGD) WITH PROPOFOL (N/A ) FOREIGN BODY REMOVAL  Patient Location: PACU and Endoscopy Unit  Anesthesia Type:General  Level of Consciousness: awake, alert , oriented and patient cooperative  Airway & Oxygen Therapy: Patient Spontanous Breathing  Post-op Assessment: Report given to RN, Post -op Vital signs reviewed and stable and Patient moving all extremities  Post vital signs: Reviewed and stable  Last Vitals:  Vitals Value Taken Time  BP 142/70 06/28/20 0957  Temp 36.3 C 06/28/20 0957  Pulse 56 06/28/20 1004  Resp 19 06/28/20 1004  SpO2 95 % 06/28/20 1004  Vitals shown include unvalidated device data.  Last Pain:  Vitals:   06/28/20 0957  TempSrc: Temporal  PainSc: 7          Complications: No complications documented.

## 2020-06-28 NOTE — Anesthesia Postprocedure Evaluation (Signed)
Anesthesia Post Note  Patient: Nathan Cross  Procedure(s) Performed: ESOPHAGOGASTRODUODENOSCOPY (EGD) WITH PROPOFOL (N/A ) FOREIGN BODY REMOVAL     Patient location during evaluation: PACU Anesthesia Type: General Level of consciousness: awake and alert Pain management: pain level controlled Vital Signs Assessment: post-procedure vital signs reviewed and stable Respiratory status: spontaneous breathing, nonlabored ventilation and respiratory function stable Cardiovascular status: blood pressure returned to baseline and stable Postop Assessment: no apparent nausea or vomiting Anesthetic complications: no   No complications documented.  Last Vitals:  Vitals:   06/28/20 0957 06/28/20 1007  BP: (!) 142/70 122/68  Pulse: 64 (!) 57  Resp: 16 19  Temp: (!) 36.3 C   SpO2: 95% 92%    Last Pain:  Vitals:   06/28/20 1007  TempSrc:   PainSc: 6                  Lucretia Kern

## 2020-06-28 NOTE — Anesthesia Procedure Notes (Signed)
Procedure Name: Intubation Date/Time: 06/28/2020 8:19 AM Performed by: Lowella Dell, CRNA Pre-anesthesia Checklist: Patient identified, Emergency Drugs available, Suction available and Patient being monitored Patient Re-evaluated:Patient Re-evaluated prior to induction Oxygen Delivery Method: Circle System Utilized Preoxygenation: Pre-oxygenation with 100% oxygen Induction Type: IV induction, Rapid sequence and Cricoid Pressure applied Laryngoscope Size: Mac and 4 Tube type: Oral Tube size: 7.0 mm Number of attempts: 1 Airway Equipment and Method: Stylet Placement Confirmation: ETT inserted through vocal cords under direct vision,  positive ETCO2 and breath sounds checked- equal and bilateral Secured at: 22 cm Tube secured with: Tape Dental Injury: Teeth and Oropharynx as per pre-operative assessment

## 2020-06-28 NOTE — Discharge Instructions (Signed)
YOU HAD AN ENDOSCOPIC PROCEDURE TODAY: Refer to the procedure report and other information in the discharge instructions given to you for any specific questions about what was found during the examination. If this information does not answer your questions, please call [place] office at [phone] to clarify.   YOU SHOULD EXPECT: Some feelings of bloating in the abdomen. Passage of more gas than usual. Walking can help get rid of the air that was put into your GI tract during the procedure and reduce the bloating. If you had a lower endoscopy (such as a colonoscopy or flexible sigmoidoscopy) you may notice spotting of blood in your stool or on the toilet paper. Some abdominal soreness may be present for a day or two, also.  DIET: Your first meal following the procedure should be a light meal and then it is ok to progress to your normal diet. A half-sandwich or bowl of soup is an example of a good first meal. Heavy or fried foods are harder to digest and may make you feel nauseous or bloated. Drink plenty of fluids but you should avoid alcoholic beverages for 24 hours. If you had a esophageal dilation, please see attached instructions for diet.    ACTIVITY: Your care partner should take you home directly after the procedure. You should plan to take it easy, moving slowly for the rest of the day. You can resume normal activity the day after the procedure however YOU SHOULD NOT DRIVE, use power tools, machinery or perform tasks that involve climbing or major physical exertion for 24 hours (because of the sedation medicines used during the test).   SYMPTOMS TO REPORT IMMEDIATELY: A gastroenterologist can be reached at any hour. Please call [phone]  for any of the following symptoms:   Following upper endoscopy (EGD, EUS, ERCP, esophageal dilation) Vomiting of blood or coffee ground material  New, significant abdominal pain  New, significant chest pain or pain under the shoulder blades  Painful or  persistently difficult swallowing  New shortness of breath  Black, tarry-looking or red, bloody stools  FOLLOW UP:  If any biopsies were taken you will be contacted by phone or by letter within the next 1-3 weeks. Call [phone]  if you have not heard about the biopsies in 3 weeks.  Please also call with any specific questions about appointments or follow up tests.  

## 2020-06-28 NOTE — H&P (Signed)
GASTROENTEROLOGY PROCEDURE H&P NOTE   Primary Care Physician: Frances Maywood, FNP  HPI: Nathan Cross is a 70 y.o. male who presents for EGD with duodenal stricture dilation attempt further.  Past Medical History:  Diagnosis Date  . Adenomatous colon polyp 2017  . Anxiety   . CHF (congestive heart failure) ([place])   . COPD (chronic obstructive pulmonary disease) ([facility])    past hx - low end   . Coronary artery disease   . Diabetes mellitus without complication ([place])    type 2  . Diverticulosis   . 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 Dysphagia 04/2020  . Dysrhythmia    Paroxysmal atrial fibrillation   . ED (erectile dysfunction)   . GERD (gastroesophageal reflux disease)   . History of eye prosthesis    Left  . Hyperlipidemia    controlled on meds   . Hypertension   . Hypothyroidism   . 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 Myocardial infarction ([place])   . PAF (paroxysmal atrial fibrillation) ([place])   . Sleep apnea    wears cpap    Past Surgical History:  Procedure Laterality Date  . ANTERIOR LAT LUMBAR FUSION N/A 06/10/2019   Procedure: Lumbar one-two , Lumbar two-three, Lumbar three-four- Anterolateral decompression/fusion with percutaneous pedicle screw fixation;  Surgeon: Erline Levine, MD;  Location: [facility];  Service: Neurosurgery;  Laterality: N/A;  . BALLOON DILATION N/A 05/27/2020   Procedure: BALLOON DILATION;  Surgeon: [name] Telford Nab., MD;  Location: [facility];  Service: Gastroenterology;  Laterality: N/A;  . CARDIAC CATHETERIZATION  11/09/2017  . COLONOSCOPY    . CORONARY ARTERY BYPASS GRAFT  2004   CABG x 4   . ELECTROPHYSIOLOGIC STUDY  05/05/2020   [facility] A-fib pulm vein    . ESOPHAGOGASTRODUODENOSCOPY (EGD) WITH PROPOFOL N/A 05/27/2020   Procedure: ESOPHAGOGASTRODUODENOSCOPY (EGD) WITH PROPOFOL;  Surgeon: [name] Telford Nab., MD;  Location: [place] [facility];  Service: Gastroenterology;  Laterality:  N/A;  . FOREIGN BODY REMOVAL  05/27/2020   Procedure: FOREIGN BODY REMOVAL;  Surgeon: [name] Telford Nab., MD;  Location: [place];  Service: Gastroenterology;;  . HERNIA REPAIR    . KIDNEY DONATION Right 1996  . LUMBAR PERCUTANEOUS PEDICLE SCREW 3 LEVEL N/A 06/10/2019   Procedure: LUMBAR PERCUTANEOUS PEDICLE SCREW 3 LEVEL;  Surgeon: Erline Levine, MD;  Location: [facility];  Service: Neurosurgery;  Laterality: N/A;  . MEDIAL PARTIAL KNEE REPLACEMENT Bilateral   . POLYPECTOMY    . Prosthetic Eye Left   . TONSILLECTOMY     as child   . UPPER GASTROINTESTINAL ENDOSCOPY     No current facility-administered medications for this encounter.   No Known Allergies Family History  Problem Relation Age of Onset  . Colon cancer Neg Hx   . Stomach cancer Neg Hx   . Pancreatic cancer Neg Hx   . Esophageal cancer Neg Hx   . Colon polyps Neg Hx   . Rectal cancer Neg Hx    Social History   Socioeconomic History  . Marital status: Married    Spouse name: Not on file  . Number of children: Not on file  . Years of education: Not on file  . Highest education level: Not on file  Occupational History  . Not on file  Tobacco Use  . Smoking status: Former Smoker    Quit date: 02/25/2020    Years since quitting: 0.3  . Smokeless tobacco: Never Used  Vaping Use  . Vaping Use: Never used  Substance and Sexual Activity  . Alcohol use: Yes    Comment: occassionally  . Drug use: Never  . Sexual activity: Yes  Other Topics Concern  . Not on file  Social History Narrative  . Not on file   Social Determinants of Health   Financial Resource Strain: Not on file  Food Insecurity: Not on file  Transportation Needs: Not on file  Physical Activity: Not on file  Stress: Not on file  Social Connections: Not on file  Intimate Partner Violence: Not on file    Physical Exam: Vital signs in last 24 hours: Temp:  [97.2 F (36.2 C)] 97.2 F (36.2 C) (01/03 0700) Pulse Rate:  [54] 54 (01/03  0700) Resp:  [18] 18 (01/03 0700) BP: (150)/(86) 150/86 (01/03 0700) SpO2:  [98 %] 98 % (01/03 0700) Weight:  [117.9 kg] 117.9 kg (01/03 0700)   GEN: NAD EYE: Sclerae anicteric ENT: MMM CV: Non-tachycardic GI: Soft, NT/ND NEURO:  Alert & Oriented x 3  Lab Results: No results for input(s): WBC, HGB, HCT, PLT in the last 72 hours. BMET No results for input(s): NA, K, CL, CO2, GLUCOSE, BUN, CREATININE, CALCIUM in the last 72 hours. LFT No results for input(s): PROT, ALBUMIN, AST, ALT, ALKPHOS, BILITOT, BILIDIR, IBILI in the last 72 hours. PT/INR No results for input(s): LABPROT, INR in the last 72 hours.   Impression / Plan: This is a 70 y.o.male who presents for EGD with duodenal stricture dilation attempt further.  The risks and benefits of endoscopic evaluation were discussed with the patient; these include but are not limited to the risk of perforation, infection, bleeding, missed lesions, lack of diagnosis, severe illness requiring hospitalization, as well as anesthesia and sedation related illnesses.  The patient is agreeable to proceed.    Corliss Parish, MD [facility] Office # [phone]

## 2020-06-28 NOTE — Op Note (Signed)
[facility] Patient Name: Nathan Cross Procedure Date : 06/28/2020 MRN: [id number] Attending MD: Justice Britain , MD Date of Birth: 10-19-1950 CSN: [id number] Age: 70 Admit Type: Outpatient Procedure:                Upper GI endoscopy Indications:              Stenosis of the duodenum, Follow-up of duodenal                            stenosis, For therapy of duodenal stenosis Providers:                Justice Britain, MD, Baird Cancer, RN, Laverda Sorenson, Technician, Cira Servant, CRNA Referring MD:             Lajuan Lines. Pyrtle, MD, Amy B. Delphina Cahill MD, MD Medicines:                General Anesthesia Complications:            No immediate complications. Estimated Blood Loss:     Estimated blood loss was minimal. Procedure:                Pre-Anesthesia Assessment:                           - Prior to the procedure, a History and Physical                            was performed, and patient medications and                            allergies were reviewed. The patient's tolerance of                            previous anesthesia was also reviewed. The risks                            and benefits of the procedure and the sedation                            options and risks were discussed with the patient.                            All questions were answered, and informed consent                            was obtained. Prior Anticoagulants: The patient has                            taken Xarelto (rivaroxaban), last dose was 3 days                            prior to procedure. ASA Grade Assessment: III - A  patient with severe systemic disease. After                            reviewing the risks and benefits, the patient was                            deemed in satisfactory condition to undergo the                            procedure.                           After obtaining informed consent, the endoscope was                             passed under direct vision. Throughout the                            procedure, the patient's blood pressure, pulse, and                            oxygen saturations were monitored continuously. The                            GIF-H190 ([id number]) Olympus gastroscope was                            introduced through the mouth, and advanced to the                            duodenal bulb. The Endoscope was introduced through                            the mouth, and advanced to the second part of                            duodenum. The upper GI endoscopy was accomplished                            without difficulty. The patient tolerated the                            procedure. Scope In: Scope Out: Findings:      No gross lesions were noted in the proximal esophagus and in the mid       esophagus.      There were esophageal mucosal changes suspicious for Barrett's esophagus       present in the distal esophagus. The maximum longitudinal extent of       these mucosal changes was 1 cm in length. Biopsies were taken with a       cold forceps for histology.      Z-line at 43 cm.      A J-shaped deformity was found in the entire examined stomach suggestive       of persistent outlet obstruction.      A small  amount of food (residue) was found in the gastric body - better       than prior. Suction via Endoscope was performed with additional removal       of food was accomplished using a Roth net.      No other gross lesions noted in stomach.      An acquired benign-appearing, intrinsic severe stenosis was found at the       apex of the bulb in the D1/D2 sweep and could not be traversed initially       due to length of EGD scope and also narrowing. This was traversed after       dilation. Placement of a short 0.025 inch Antonietta Breach was attempted. This       passed successfully. Using an above-extraction balloon I injected to       determine the length of the stricture  which was felt to be 2-3 cm in       size. Over the wire, a TTS dilator was passed through the scope.       Dilation with an 02-01-09 mm, a 04-06-11 mm and a 12-13.5-15 mm pyloric       balloon dilator (up to maximum of 13.5 mm) was performed under       fluoroscopic guidance. The dilation site was examined following       endoscope reinsertion and showed moderate mucosal disruption and       moderate improvement in luminal narrowing.      No gross lesions were noted in the second portion of the duodenum. Impression:               - No gross lesions in esophagus proximally.                            Esophageal mucosal changes suspicious for Barrett's                            esophagus distally. Biopsied.                           - J-shaped deformity in the entire stomach.                           - A small amount of food (residue) in the stomach.                            Removal was successful.                           - No other gross lesions noted in the stomach.                           - Acquired duodenal stenosis. Dilated and traversed.                           - No gross lesions in the second portion of the                            duodenum. Recommendation:           - The  patient will be observed post-procedure,                            until all discharge criteria are met.                           - Discharge patient to home.                           - Patient has a contact number available for                            emergencies. The signs and symptoms of potential                            delayed complications were discussed with the                            patient. Return to normal activities tomorrow.                            Written discharge instructions were provided to the                            patient.                           - Clear liquid diet today then Low residue diet.                           - No aspirin, ibuprofen, naproxen, or  other                            non-steroidal anti-inflammatory drugs.                           - Increase Omeprazole to 40 mg twice daily.                           - May restart Eliquis in 48 hours (1/5 PM).                           - Repeat upper endoscopy in 3-4 weeks for                            retreatment and further dilation. Clear liquid diet                            the day prior.                           - The findings and recommendations were discussed                            with the patient.                           -  The findings and recommendations were discussed                            with the patient's family. Procedure Code(s):        --- Professional ---                           [phone], Esophagogastroduodenoscopy, flexible,                            transoral; with removal of foreign body(s)                           [id number], Esophagogastroduodenoscopy, flexible,                            transoral; with dilation of gastric/duodenal                            stricture(s) (eg, balloon, bougie) Diagnosis Code(s):        --- Professional ---                           K22.8, Other specified diseases of esophagus                           K31.89, Other diseases of stomach and duodenum                           T18.2XXA, Foreign body in stomach, initial encounter                           K31.5, Obstruction of duodenum CPT copyright 2019 American Medical Association. All rights reserved. The codes documented in this report are preliminary and upon coder review may  be revised to meet current compliance requirements. Justice Britain, MD 06/28/2020 10:08:20 AM Number of Addenda: 0

## 2020-06-29 LAB — SURGICAL PATHOLOGY

## 2020-07-04 ENCOUNTER — Encounter: Payer: Self-pay | Admitting: Gastroenterology

## 2020-07-05 ENCOUNTER — Telehealth: Payer: Self-pay

## 2020-07-05 NOTE — Telephone Encounter (Signed)
4-week EGD recall with duodenal stricture dilation. Fluoroscopy needed. Ultraslim colonoscope needed. Thanks.

## 2020-07-08 ENCOUNTER — Other Ambulatory Visit: Payer: Self-pay

## 2020-07-08 DIAGNOSIS — K315 Obstruction of duodenum: Secondary | ICD-10-CM

## 2020-07-08 NOTE — Telephone Encounter (Signed)
EGD has been scheduled for 08/12/20 at 11 am at [facility] with Dr [name].  COVID on 08/09/20 at 1015 am.

## 2020-07-08 NOTE — Telephone Encounter (Signed)
EGD scheduled, pt instructed and medications reviewed.  Patient instructions mailed to home.  The pt wife was given the instructions verbally.  She repeated the inforamtion to me and all questions answered.  Patient to call with any questions or concerns.

## 2020-08-02 ENCOUNTER — Telehealth: Payer: Self-pay | Admitting: Gastroenterology

## 2020-08-02 NOTE — Telephone Encounter (Signed)
Nathan Cross, Not sure what is going to end up happening in [place] so when we tentatively set up EGD in 6 to 7 weeks from now. May also be helpful to have him follow-up in clinic so let him be set up with Dr. Hilarie Fredrickson his primary or one of the APP's to see how he has been doing post dilation. Thanks. GM

## 2020-08-02 NOTE — Telephone Encounter (Signed)
Dr [name] FYI please advise on timing of rescheduling the EGD.

## 2020-08-03 ENCOUNTER — Encounter: Payer: Self-pay | Admitting: *Deleted

## 2020-08-03 NOTE — Telephone Encounter (Signed)
The pt has been rescheduled to 09/06/20 at [facility] with Dr [name] and COVID test on 3/10 at 930 am. Follow up with Janett Billow on 08/27/20 at 230 pm.    Dr [name] how do you want to handle the xarelto?  Per cardiology (care everywhere) the pt is NOT to stop xarelto for 3 months. He is also going to be admitted for cardiology concerns next week. Please advise.

## 2020-08-03 NOTE — Telephone Encounter (Signed)
I can see him on 08/11/20 at 1130 as an overbook.  Please let him know this is an overbook and wait may be slightly longer than usual

## 2020-08-03 NOTE — Telephone Encounter (Signed)
Dr Hilarie Fredrickson sorry this pt is going to be hospitalized for some cardiology work up the week of the scheduled procedure. I am going to get him rescheduled for 3/14.  I will move his appt with you to later as well.

## 2020-08-03 NOTE — Telephone Encounter (Signed)
Dr Hilarie Fredrickson the pt has an EGD at [facility] on 2/17 with Dr [name].  Dr [name] has asked that he see you or one of the Apps prior to the procedure to see how he is doing post last dilation.  There are no appts with you or the apps prior to the 2/17 appt.  Do you want me to double book him with you?

## 2020-08-06 NOTE — Telephone Encounter (Signed)
Patty, I think we will have to get an update on the ability to hold the Eliquis at the time of the patient's clinic visit.  If we cannot stop the Eliquis for 3 months then we will have to hold off on the procedure but for now keep the current date that you have set and the patient will see Janett Billow and this can be found out at that time.  Thanks. GM

## 2020-08-06 NOTE — Telephone Encounter (Signed)
Agree.  Thanks all. Eliquis decision at time of office visit with input from prescribing provider.

## 2020-08-09 ENCOUNTER — Other Ambulatory Visit (HOSPITAL_COMMUNITY): Payer: Medicare Other

## 2020-08-11 ENCOUNTER — Ambulatory Visit: Payer: Medicare Other | Admitting: Internal Medicine

## 2020-08-27 ENCOUNTER — Ambulatory Visit (INDEPENDENT_AMBULATORY_CARE_PROVIDER_SITE_OTHER): Payer: Medicare Other | Admitting: Gastroenterology

## 2020-08-27 ENCOUNTER — Other Ambulatory Visit: Payer: Self-pay

## 2020-08-27 ENCOUNTER — Encounter: Payer: Self-pay | Admitting: Gastroenterology

## 2020-08-27 VITALS — BP 126/80 | HR 85 | Ht 75.0 in | Wt 262.6 lb

## 2020-08-27 DIAGNOSIS — I4891 Unspecified atrial fibrillation: Secondary | ICD-10-CM | POA: Diagnosis not present

## 2020-08-27 DIAGNOSIS — K315 Obstruction of duodenum: Secondary | ICD-10-CM | POA: Diagnosis not present

## 2020-08-27 DIAGNOSIS — Z7901 Long term (current) use of anticoagulants: Secondary | ICD-10-CM | POA: Insufficient documentation

## 2020-08-27 NOTE — Progress Notes (Signed)
08/27/2020 Nathan Cross [id number] [birth year]   HISTORY OF PRESENT ILLNESS: This is a 70 year old male is a patient of Dr. Vena Rua who has acquired duodenal stenosis/stricture.  He has had that dilated by Dr. [name] on a few occasions, the last just in January of this year.  He is scheduled for another procedure on March 14.  He has atrial fibrillation and is on Xarelto.  There was some question as to if he was going to undergo some further cardiac intervention in regards to his atrial fibrillation.  He has a monitor on currently until March 11, but there are no plans for any other interventions for now.  He says he feels like his atrial fibrillation is more controlled now than it was several months ago.  He says that his cardiologist is aware of his upcoming procedure.  In regards to his GI symptoms he says that he feels well, about 95 percent normal.  No complaints of nausea, vomiting, abdominal pain.  Moves his bowels well.   Past Medical History:  Diagnosis Date  . Adenomatous colon polyp 2017  . Anxiety   . CHF (congestive heart failure) ([place])   . COPD (chronic obstructive pulmonary disease) ([facility])    past hx - low end   . Coronary artery disease   . Diabetes mellitus without complication ([place])    type 2  . Diverticulosis   . 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 Dysphagia 04/2020  . Dysrhythmia    Paroxysmal atrial fibrillation   . ED (erectile dysfunction)   . GERD (gastroesophageal reflux disease)   . History of eye prosthesis    Left  . Hyperlipidemia    controlled on meds   . Hypertension   . Hypothyroidism   . 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 Myocardial infarction ([place])   . PAF (paroxysmal atrial fibrillation) ([place])   . Sleep apnea    wears cpap   . Zenker's diverticulum    Past Surgical History:  Procedure Laterality Date  . ANTERIOR LAT LUMBAR FUSION N/A 06/10/2019   Procedure: Lumbar one-two , Lumbar two-three,  Lumbar three-four- Anterolateral decompression/fusion with percutaneous pedicle screw fixation;  Surgeon: Erline Levine, MD;  Location: [place];  Service: Neurosurgery;  Laterality: N/A;  . BALLOON DILATION N/A 05/27/2020   Procedure: BALLOON DILATION;  Surgeon: [name] Telford Nab., MD;  Location: [place];  Service: Gastroenterology;  Laterality: N/A;  . BALLOON DILATION N/A 06/28/2020   Procedure: BALLOON DILATION;  Surgeon: [name] Telford Nab., MD;  Location: [place];  Service: Gastroenterology;  Laterality: N/A;  . BIOPSY  06/28/2020   Procedure: BIOPSY;  Surgeon: [name] Telford Nab., MD;  Location: [place];  Service: Gastroenterology;;  . CARDIAC CATHETERIZATION  11/09/2017  . COLONOSCOPY    . CORONARY ARTERY BYPASS GRAFT  2004   CABG x 4   . ELECTROPHYSIOLOGIC STUDY  05/05/2020   [facility] A-fib pulm vein    . ESOPHAGOGASTRODUODENOSCOPY (EGD) WITH PROPOFOL N/A 05/27/2020   Procedure: ESOPHAGOGASTRODUODENOSCOPY (EGD) WITH PROPOFOL;  Surgeon: [name] Telford Nab., MD;  Location: [place];  Service: Gastroenterology;  Laterality: N/A;  . ESOPHAGOGASTRODUODENOSCOPY (EGD) WITH PROPOFOL N/A 06/28/2020   Procedure: ESOPHAGOGASTRODUODENOSCOPY (EGD) WITH PROPOFOL;  Surgeon: [name] Telford Nab., MD;  Location: [place];  Service: Gastroenterology;  Laterality: N/A;  Fluoro   . FOREIGN BODY REMOVAL  05/27/2020   Procedure: FOREIGN BODY REMOVAL;  Surgeon: [name] Telford Nab., MD;  [facility]  ENDOSCOPY;  Service: Gastroenterology;;  . FOREIGN BODY REMOVAL  06/28/2020   Procedure: FOREIGN BODY REMOVAL;  Surgeon: [name] Telford Nab., MD;  Location: [place];  Service: Gastroenterology;;  . HERNIA REPAIR    . KIDNEY DONATION Right 1996  . LUMBAR PERCUTANEOUS PEDICLE SCREW 3 LEVEL N/A 06/10/2019   Procedure: LUMBAR PERCUTANEOUS PEDICLE SCREW 3 LEVEL;  Surgeon: Erline Levine, MD;  Location: [place];  Service: Neurosurgery;  Laterality: N/A;  . MEDIAL PARTIAL  KNEE REPLACEMENT Bilateral   . POLYPECTOMY    . Prosthetic Eye Left   . TONSILLECTOMY     as child   . UPPER GASTROINTESTINAL ENDOSCOPY      reports that he quit smoking about 6 months ago. He has never used smokeless tobacco. He reports current alcohol use. He reports that he does not use drugs. family history is not on file. No Known Allergies    Outpatient Encounter Medications as of 08/27/2020  Medication Sig  . acetaminophen (TYLENOL) 650 MG CR tablet Take 1,300 mg by mouth every 8 (eight) hours as needed for pain.   . clobetasol cream (TEMOVATE) 3.55 % Apply 1 application topically 2 (two) times daily as needed (dry skin (ankles)).  Marland Kitchen clonazePAM (KLONOPIN) 0.5 MG tablet Take 0.5 mg by mouth daily.  . furosemide (LASIX) 20 MG tablet Take 20 mg by mouth 2 (two) times daily.  Marland Kitchen gemfibrozil (LOPID) 600 MG tablet Take 600 mg by mouth 2 (two) times daily before a meal.  . icosapent Ethyl (VASCEPA) 1 g capsule Take 1 g by mouth daily.  Marland Kitchen levothyroxine (SYNTHROID) 50 MCG tablet Take 50 mcg by mouth daily before breakfast.  . lisinopril (ZESTRIL) 5 MG tablet Take 5 mg by mouth daily.  . metFORMIN (GLUCOPHAGE) 500 MG tablet Take 500 mg by mouth 2 (two) times daily with a meal.  . metoprolol succinate (TOPROL-XL) 50 MG 24 hr tablet Take 50 mg by mouth 2 (two) times daily. Take with or immediately following a meal.  . nitroGLYCERIN (NITROSTAT) 0.4 MG SL tablet Place 0.4 mg under the tongue every 5 (five) minutes x 3 doses as needed for chest pain.   Marland Kitchen NOVOLOG FLEXPEN 100 UNIT/ML FlexPen Inject 3 Units into the skin daily as needed for high blood sugar (over 150).   Marland Kitchen omeprazole (PRILOSEC) 40 MG capsule Take 1 capsule (40 mg total) by mouth 2 (two) times daily before a meal.  . Probiotic Product (PROBIOTIC DIGESTIVE SUPP PO) Take 1 capsule by mouth daily.  . rosuvastatin (CRESTOR) 20 MG tablet Take 20 mg by mouth daily.  . sildenafil (REVATIO) 20 MG tablet Take 20-40 mg by mouth daily as needed  (ED).   . vitamin C (ASCORBIC ACID) 500 MG tablet Take 500 mg by mouth daily.  Alveda Reasons 20 MG TABS tablet Take 1 tablet (20 mg total) by mouth daily.   No facility-administered encounter medications on file as of 08/27/2020.     REVIEW OF SYSTEMS  : All other systems reviewed and negative except where noted in the History of Present Illness.   PHYSICAL EXAM: BP 126/80   Pulse 85   Ht 6\' 3"  (1.905 m)   Wt 262 lb 9.6 oz (119.1 kg)   SpO2 96%   BMI 32.82 kg/m  General: Well developed white male in no acute distress Head: Normocephalic and atraumatic Eyes:  Sclerae anicteric, conjunctiva pink. Ears: Normal auditory acuity Lungs: Clear throughout to auscultation; no W/R/R. Heart: Regular rate and rhythm; no M/R/G. Abdomen: Soft,  non-distended.  BS present.  Non-tender. Musculoskeletal: Symmetrical with no gross deformities  Skin: No lesions on visible extremities Extremities: No edema  Neurological: Alert oriented x 4, grossly non-focal Psychological:  Alert and cooperative. Normal mood and affect  ASSESSMENT AND PLAN: *Duodenal stricture/stenosis: Has already been dilated on couple of occasions and he is scheduled for another dilation with Dr. [name] on March 14.  Okay to proceed per his cardiologist. *Chronic anticoagulation with Xarelto for history of atrial fibrillation:  Will hold Xarelto for 24 hours prior to endoscopic procedures - will instruct when and how to resume after procedure. Benefits and risks of procedure explained including risks of bleeding, perforation, infection, missed lesions, reactions to medications and possible need for hospitalization and surgery for complications. Additional rare but real risk of stroke or other vascular clotting events off of Xarelto also explained and need to seek urgent help if any signs of these problems occur. Will communicate by phone or EMR with patient's  prescribing provider, Dr. Alroy Dust, to confirm that holding Xarelto is  reasonable in this case.   CC:  Frances Maywood, FNP

## 2020-08-27 NOTE — Patient Instructions (Signed)
If you are age 68 or older, your body mass index should be between 23-30. Your Body mass index is 32.82 kg/m. If this is out of the aforementioned range listed, please consider follow up with your Primary Care Provider.  If you are age 20 or younger, your body mass index should be between 19-25. Your Body mass index is 32.82 kg/m. If this is out of the aformentioned range listed, please consider follow up with your Primary Care Provider.

## 2020-08-27 NOTE — Progress Notes (Signed)
Addendum: Reviewed and agree with assessment and management plan. Yovan Leeman M, MD  

## 2020-08-30 NOTE — Progress Notes (Signed)
Attempted to obtain medical history via telephone, unable to reach at this time. I left a voicemail to return pre surgical testing department's phone call.  

## 2020-09-01 NOTE — Progress Notes (Signed)
Attempted to obtain medical history via telephone, unable to reach at this time. I left a voicemail to return pre surgical testing department's phone call.  

## 2020-09-02 ENCOUNTER — Other Ambulatory Visit (HOSPITAL_COMMUNITY)
Admission: RE | Admit: 2020-09-02 | Discharge: 2020-09-02 | Disposition: A | Discharge status: Home / Self Care | Payer: Medicare Other | Source: Ambulatory Visit | Attending: Gastroenterology | Admitting: Gastroenterology

## 2020-09-02 DIAGNOSIS — Z20822 Contact with and (suspected) exposure to covid-19: Secondary | ICD-10-CM | POA: Insufficient documentation

## 2020-09-02 DIAGNOSIS — Z01812 Encounter for preprocedural laboratory examination: Secondary | ICD-10-CM | POA: Insufficient documentation

## 2020-09-02 LAB — SARS CORONAVIRUS 2 (TAT 6-24 HRS): SARS Coronavirus 2: NEGATIVE

## 2020-09-03 ENCOUNTER — Encounter (HOSPITAL_COMMUNITY): Payer: Self-pay | Admitting: Gastroenterology

## 2020-09-03 ENCOUNTER — Other Ambulatory Visit: Payer: Self-pay

## 2020-09-03 ENCOUNTER — Telehealth: Payer: Self-pay | Admitting: *Deleted

## 2020-09-03 NOTE — Telephone Encounter (Addendum)
   Nathan Cross December 13, 1950 [id number]  Dear Dr. Westley Gambles:  We have scheduled the above named patient for a(n) endoscopy procedure. Our records show that (s)he is on anticoagulation therapy.  Please advise as to whether the patient may come off their therapy of Xarelto 1 days prior to their procedure which is scheduled for Monday 09/06/20.  Please fax response to [phone].  Sincerely,   Nira Conn, [facility]

## 2020-09-03 NOTE — Telephone Encounter (Signed)
Received clearance from Dr. Westley Gambles office for patient to hold Xarelto for 24 hours and to restart as soon as possible after procedure. Patient's wife informed by Dr. Westley Gambles office.

## 2020-09-05 NOTE — Anesthesia Preprocedure Evaluation (Addendum)
Anesthesia Evaluation  Patient identified by MRN, date of birth, ID band Patient awake    Reviewed: Allergy & Precautions, H&P , NPO status , Patient's Chart, lab work & pertinent test results  Airway Mallampati: III  TM Distance: >3 FB Neck ROM: Full    Dental no notable dental hx. (+) Teeth Intact, Dental Advisory Given   Pulmonary sleep apnea and Continuous Positive Airway Pressure Ventilation , COPD, Patient abstained from smoking., former smoker,    Pulmonary exam normal breath sounds clear to auscultation       Cardiovascular Exercise Tolerance: Good hypertension, Pt. on medications and Pt. on home beta blockers + CAD, + Past MI, + CABG and +CHF  + dysrhythmias Atrial Fibrillation  Rhythm:Regular Rate:Normal     Neuro/Psych Anxiety negative neurological ROS     GI/Hepatic Neg liver ROS, GERD  Medicated,  Endo/Other  diabetes, Type 2, Oral Hypoglycemic AgentsHypothyroidism   Renal/GU negative Renal ROS  negative genitourinary   Musculoskeletal   Abdominal   Peds  Hematology negative hematology ROS (+)   Anesthesia Other Findings   Reproductive/Obstetrics negative OB ROS                            Anesthesia Physical Anesthesia Plan  ASA: III  Anesthesia Plan: MAC   Post-op Pain Management:    Induction: Intravenous  PONV Risk Score and Plan: 1 and Propofol infusion and Treatment may vary due to age or medical condition  Airway Management Planned: Simple Face Mask  Additional Equipment:   Intra-op Plan:   Post-operative Plan:   Informed Consent: I have reviewed the patients History and Physical, chart, labs and discussed the procedure including the risks, benefits and alternatives for the proposed anesthesia with the patient or authorized representative who has indicated his/her understanding and acceptance.     Dental advisory given  Plan Discussed with:  CRNA  Anesthesia Plan Comments:        Anesthesia Quick Evaluation

## 2020-09-06 ENCOUNTER — Other Ambulatory Visit: Payer: Self-pay

## 2020-09-06 ENCOUNTER — Ambulatory Visit (HOSPITAL_COMMUNITY): Payer: Medicare Other | Admitting: Anesthesiology

## 2020-09-06 ENCOUNTER — Encounter (HOSPITAL_COMMUNITY)
Admission: RE | Disposition: A | Discharge status: Home / Self Care | Payer: Self-pay | Source: Home / Self Care | Attending: Gastroenterology

## 2020-09-06 ENCOUNTER — Ambulatory Visit (HOSPITAL_COMMUNITY)
Admission: RE | Admit: 2020-09-06 | Discharge: 2020-09-06 | Disposition: A | Discharge status: Home / Self Care | Payer: Medicare Other | Attending: Gastroenterology | Admitting: Gastroenterology

## 2020-09-06 ENCOUNTER — Encounter (HOSPITAL_COMMUNITY): Payer: Self-pay | Admitting: Gastroenterology

## 2020-09-06 DIAGNOSIS — R140 Abdominal distension (gaseous): Secondary | ICD-10-CM | POA: Diagnosis not present

## 2020-09-06 DIAGNOSIS — Z87891 Personal history of nicotine dependence: Secondary | ICD-10-CM | POA: Diagnosis not present

## 2020-09-06 DIAGNOSIS — X58XXXA Exposure to other specified factors, initial encounter: Secondary | ICD-10-CM | POA: Diagnosis not present

## 2020-09-06 DIAGNOSIS — K3189 Other diseases of stomach and duodenum: Secondary | ICD-10-CM | POA: Diagnosis not present

## 2020-09-06 DIAGNOSIS — K2289 Other specified disease of esophagus: Secondary | ICD-10-CM | POA: Insufficient documentation

## 2020-09-06 DIAGNOSIS — K315 Obstruction of duodenum: Secondary | ICD-10-CM

## 2020-09-06 DIAGNOSIS — T183XXA Foreign body in small intestine, initial encounter: Secondary | ICD-10-CM

## 2020-09-06 HISTORY — PX: BALLOON DILATION: SHX5330

## 2020-09-06 HISTORY — PX: FOREIGN BODY REMOVAL: SHX962

## 2020-09-06 HISTORY — PX: ESOPHAGOGASTRODUODENOSCOPY (EGD) WITH PROPOFOL: SHX5813

## 2020-09-06 LAB — POCT I-STAT, CHEM 8
BUN: 18 mg/dL (ref 8–23)
Calcium, Ion: 1.25 mmol/L (ref 1.15–1.40)
Chloride: 108 mmol/L (ref 98–111)
Creatinine, Ser: 1.1 mg/dL (ref 0.61–1.24)
Glucose, Bld: 99 mg/dL (ref 70–99)
HCT: 39 % (ref 39.0–52.0)
Hemoglobin: 13.3 g/dL (ref 13.0–17.0)
Potassium: 4 mmol/L (ref 3.5–5.1)
Sodium: 141 mmol/L (ref 135–145)
TCO2: 22 mmol/L (ref 22–32)

## 2020-09-06 SURGERY — ESOPHAGOGASTRODUODENOSCOPY (EGD) WITH PROPOFOL
Anesthesia: Monitor Anesthesia Care

## 2020-09-06 MED ORDER — PROPOFOL 10 MG/ML IV BOLUS
INTRAVENOUS | Status: DC | PRN
  Administered 2020-09-06: 30 mg via INTRAVENOUS
  Administered 2020-09-06: 40 mg via INTRAVENOUS
  Administered 2020-09-06: 25 mg via INTRAVENOUS
  Administered 2020-09-06: 35 mg via INTRAVENOUS

## 2020-09-06 MED ORDER — PROPOFOL 500 MG/50ML IV EMUL
INTRAVENOUS | Status: DC | PRN
  Administered 2020-09-06: 150 ug/kg/min via INTRAVENOUS

## 2020-09-06 MED ORDER — LIDOCAINE 2% (20 MG/ML) 5 ML SYRINGE
INTRAMUSCULAR | Status: DC | PRN
  Administered 2020-09-06: 80 mg via INTRAVENOUS

## 2020-09-06 MED ORDER — EPHEDRINE SULFATE-NACL 50-0.9 MG/10ML-% IV SOSY
PREFILLED_SYRINGE | INTRAVENOUS | Status: DC | PRN
  Administered 2020-09-06 (×3): 5 mg via INTRAVENOUS

## 2020-09-06 MED ORDER — XARELTO 20 MG PO TABS: 20.0000 mg | ORAL_TABLET | Freq: Every day | ORAL | 0 refills | Status: DC

## 2020-09-06 MED ORDER — LACTATED RINGERS IV SOLN: INTRAVENOUS | Status: DC

## 2020-09-06 MED ORDER — SODIUM CHLORIDE 0.9 % IV SOLN: INTRAVENOUS | Status: DC

## 2020-09-06 SURGICAL SUPPLY — 14 items

## 2020-09-06 NOTE — H&P (Signed)
GASTROENTEROLOGY PROCEDURE H&P NOTE   Primary Care Physician: Frances Maywood, FNP  HPI: Nathan Cross is a 70 y.o. male who presents for history of duodenal stricture s/p dilations.  Past Medical History:  Diagnosis Date  . Adenomatous colon polyp 2017  . Anxiety   . CHF (congestive heart failure) ([place])   . COPD (chronic obstructive pulmonary disease) ([facility])    past hx - low end   . Coronary artery disease   . Diabetes mellitus without complication ([place])    type 2  . Diverticulosis   . 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 Dysphagia 04/2020  . Dysrhythmia    Paroxysmal atrial fibrillation   . ED (erectile dysfunction)   . GERD (gastroesophageal reflux disease)   . History of eye prosthesis    Left  . Hyperlipidemia    controlled on meds   . Hypertension   . Hypothyroidism   . 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 Myocardial infarction ([place])   . PAF (paroxysmal atrial fibrillation) ([place])   . Sleep apnea    wears cpap   . Zenker's diverticulum    Past Surgical History:  Procedure Laterality Date  . ANTERIOR LAT LUMBAR FUSION N/A 06/10/2019   Procedure: Lumbar one-two , Lumbar two-three, Lumbar three-four- Anterolateral decompression/fusion with percutaneous pedicle screw fixation;  Surgeon: Erline Levine, MD;  Location: [place];  Service: Neurosurgery;  Laterality: N/A;  . BALLOON DILATION N/A 05/27/2020   Procedure: BALLOON DILATION;  Surgeon: [name] Telford Nab., MD;  Location: [place];  Service: Gastroenterology;  Laterality: N/A;  . BALLOON DILATION N/A 06/28/2020   Procedure: BALLOON DILATION;  Surgeon: [name] Telford Nab., MD;  Location: [place];  Service: Gastroenterology;  Laterality: N/A;  . BIOPSY  06/28/2020   Procedure: BIOPSY;  Surgeon: [name] Telford Nab., MD;  Location: [place];  Service: Gastroenterology;;  . CARDIAC CATHETERIZATION  11/09/2017  . COLONOSCOPY    . CORONARY ARTERY BYPASS  GRAFT  2004   CABG x 4   . ELECTROPHYSIOLOGIC STUDY  05/05/2020   [facility] A-fib pulm vein    . ESOPHAGOGASTRODUODENOSCOPY (EGD) WITH PROPOFOL N/A 05/27/2020   Procedure: ESOPHAGOGASTRODUODENOSCOPY (EGD) WITH PROPOFOL;  Surgeon: [name] Telford Nab., MD;  Location: [place];  Service: Gastroenterology;  Laterality: N/A;  . ESOPHAGOGASTRODUODENOSCOPY (EGD) WITH PROPOFOL N/A 06/28/2020   Procedure: ESOPHAGOGASTRODUODENOSCOPY (EGD) WITH PROPOFOL;  Surgeon: [name] Telford Nab., MD;  Location: [place];  Service: Gastroenterology;  Laterality: N/A;  Fluoro   . FOREIGN BODY REMOVAL  05/27/2020   Procedure: FOREIGN BODY REMOVAL;  Surgeon: [name] Telford Nab., MD;  Location: [place];  Service: Gastroenterology;;  . FOREIGN BODY REMOVAL  06/28/2020   Procedure: FOREIGN BODY REMOVAL;  Surgeon: Irving Copas., MD;  Location: [place];  Service: Gastroenterology;;  . HERNIA REPAIR    . KIDNEY DONATION Right 1996  . LUMBAR PERCUTANEOUS PEDICLE SCREW 3 LEVEL N/A 06/10/2019   Procedure: LUMBAR PERCUTANEOUS PEDICLE SCREW 3 LEVEL;  Surgeon: Erline Levine, MD;  Location: [place];  Service: Neurosurgery;  Laterality: N/A;  . MEDIAL PARTIAL KNEE REPLACEMENT Bilateral   . POLYPECTOMY    . Prosthetic Eye Left   . TONSILLECTOMY     as child   . UPPER GASTROINTESTINAL ENDOSCOPY     Current Facility-Administered Medications  Medication Dose Route Frequency Provider Last Rate Last Admin  . 0.9 %  sodium chloride infusion   Intravenous Continuous Mansouraty, Telford Nab., MD      .  lactated ringers infusion   Intravenous Continuous Mansouraty, Telford Nab., MD 20 mL/hr at 09/06/20 0932 New Bag at 09/06/20 0932   No Known Allergies Family History  Problem Relation Age of Onset  . Colon cancer Neg Hx   . Stomach cancer Neg Hx   . Pancreatic cancer Neg Hx   . Esophageal cancer Neg Hx   . Colon polyps Neg Hx   . Rectal cancer Neg Hx    Social History   Socioeconomic History   . Marital status: Married    Spouse name: Not on file  . Number of children: Not on file  . Years of education: Not on file  . Highest education level: Not on file  Occupational History  . Not on file  Tobacco Use  . Smoking status: Former Smoker    Quit date: 02/25/2020    Years since quitting: 0.5  . Smokeless tobacco: Never Used  Vaping Use  . Vaping Use: Never used  Substance and Sexual Activity  . Alcohol use: Yes    Comment: occassionally  . Drug use: Never  . Sexual activity: Yes  Other Topics Concern  . Not on file  Social History Narrative  . Not on file   Social Determinants of Health   Financial Resource Strain: Not on file  Food Insecurity: Not on file  Transportation Needs: Not on file  Physical Activity: Not on file  Stress: Not on file  Social Connections: Not on file  Intimate Partner Violence: Not on file    Physical Exam: Vital signs in last 24 hours: Temp:  [98.1 F (36.7 C)] 98.1 F (36.7 C) (03/14 0916) Pulse Rate:  [54] 54 (03/14 0916) Resp:  [15] 15 (03/14 0916) BP: (126)/(72) 126/72 (03/14 0916) SpO2:  [99 %] 99 % (03/14 0916)   GEN: NAD EYE: Sclerae anicteric ENT: MMM CV: Non-tachycardic GI: Soft, NT/ND NEURO:  Alert & Oriented x 3  Lab Results: Recent Labs    09/06/20 0933  HGB 13.3  HCT 39.0   BMET Recent Labs    09/06/20 0933  NA 141  K 4.0  CL 108  GLUCOSE 99  BUN 18  CREATININE 1.10   LFT No results for input(s): PROT, ALBUMIN, AST, ALT, ALKPHOS, BILITOT, BILIDIR, IBILI in the last 72 hours. PT/INR No results for input(s): LABPROT, INR in the last 72 hours.   Impression / Plan: This is a 70 y.o.male who presents for history of duodenal stricture s/p dilations.  The risks and benefits of endoscopic evaluation were discussed with the patient; these include but are not limited to the risk of perforation, infection, bleeding, missed lesions, lack of diagnosis, severe illness requiring hospitalization, as well as  anesthesia and sedation related illnesses.  The patient is agreeable to proceed.    Justice Britain, MD [facility] Advanced Endoscopy Office # [phone]

## 2020-09-06 NOTE — Op Note (Signed)
[facility] Patient Name: Nathan Cross Procedure Date: 09/06/2020 MRN: [id number] Attending MD: Justice Britain , MD Date of Birth: 10-Feb-1951 CSN: [id number] Age: 70 Admit Type: Outpatient Procedure:                Upper GI endoscopy Indications:              Therapeutic procedure, Stenosis of the duodenum,                            Follow-up of duodenal stenosis, For therapy of                            duodenal stenosis, Abdominal bloating (95% better                            after our dilations have been performed per patient                            report today) Providers:                Justice Britain, MD, Jeanella Cara, RN,                            Laverda Sorenson, Technician, Margurite Auerbach Referring MD:             Lajuan Lines. Pyrtle, MD, Amy L. Ailene Ards Zehr                            [place], [place] Medicines:                Monitored Anesthesia Care Complications:            No immediate complications. Estimated Blood Loss:     Estimated blood loss was minimal. Procedure:                Pre-Anesthesia Assessment:                           - Prior to the procedure, a History and Physical                            was performed, and patient medications and                            allergies were reviewed. The patient's tolerance of                            previous anesthesia was also reviewed. The risks                            and benefits of the procedure and the sedation                            options and risks were discussed with the patient.  All questions were answered, and informed consent                            was obtained. Prior Anticoagulants: The patient has                            taken Xarelto (rivaroxaban), last dose was 2 days                            prior to procedure. ASA Grade Assessment: III - A                            patient with severe systemic disease. After                             reviewing the risks and benefits, the patient was                            deemed in satisfactory condition to undergo the                            procedure.                           After obtaining informed consent, the endoscope was                            passed under direct vision. Throughout the                            procedure, the patient's blood pressure, pulse, and                            oxygen saturations were monitored continuously. The                            GIF-H190 ([id number]) Olympus gastroscope was                            introduced through the mouth, and advanced to the                            second part of duodenum. The upper GI endoscopy was                            accomplished without difficulty. The patient                            tolerated the procedure. Scope In: Scope Out: Findings:      No gross lesions were noted in the entire esophagus.      The Z-line was irregular and was found 45 cm from the incisors.      Striped mildly erythematous mucosa without bleeding was found in the       gastric body  and in the gastric antrum.      No other gross lesions were noted in the entire examined stomach.      Moderate amount of food (residue) was found in the duodenal bulb.       Removal of food was accomplished with roth net after multiple captures.      An acquired benign-appearing, intrinsic moderate stenosis was found at       the apex of the duodenal bulb extending into the D1/D2 sweep and was       traversed after gentle pressure and lateral rotation with the left/right       dial into D2. A TTS dilator was passed through the scope. Dilation with       a 12-13.5-15 mm pyloric balloon dilator was performed up to a maximum of       15 mm. The dilation site was examined under the balloon and showed       moderate mucosal disruption, moderate improvement in luminal narrowing       and no perforation. Bleeding/oozing was  noted. Tamponade was performed       with replacement of the CRE balloon for pressure purposes. The scope       passed with greater ease thereafter into D2.      No gross mucosal lesions were noted in the second portion of the       duodenum. Impression:               - No gross lesions in esophagus. Z-line irregular,                            45 cm from the incisors.                           - Erythematous mucosa in the gastric body and                            antrum. No other gross lesions in the stomach.                           - Retained food in the duodenum bulb. Removal was                            successful.                           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 Traversed with EGD                            scope initially and then dilated with CRE balloon                            to 15 mm with good effect. Mild oozing noted. Scope                            passed with greater  ease thereafter.                           - No gross lesions in the second portion of the                            duodenum. Moderate Sedation:      Not Applicable - Patient had care per Anesthesia. Recommendation:           - The patient will be observed post-procedure,                            until all discharge criteria are met.                           - Discharge patient to home.                           - Patient has a contact number available for                            emergencies. The signs and symptoms of potential                            delayed complications were discussed with the                            patient. Return to normal activities tomorrow.                            Written discharge instructions were provided to the                            patient.                           - Full liquid diet today.                           - PPI 40 mg twice daily for 51-monthand then may                             decrease to 40 mg daily.                           - Restart Xarelto on 3/16 (48 hours from now) to                            decrease risk of post-interventional bleeding.                           - Observe patient's clinical course.                           - Repeat upper endoscopy PRN for retreatment with  Dr. Hilarie Fredrickson.                           - The findings and recommendations were discussed                            with the patient.                           - The findings and recommendations were discussed                            with the patient's family. Procedure Code(s):        --- Professional ---                           [phone], Esophagogastroduodenoscopy, flexible,                            transoral; with removal of foreign body(s)                           43245, Esophagogastroduodenoscopy, flexible,                            transoral; with dilation of gastric/duodenal                            stricture(s) (eg, balloon, bougie) Diagnosis Code(s):        --- Professional ---                           K22.8, Other specified diseases of esophagus                           K31.89, Other diseases of stomach and duodenum                           T18.3XXA, Foreign body in small intestine, initial                            encounter                           K31.5, Obstruction of duodenum                           R14.0, Abdominal distension (gaseous) CPT copyright 2019 American Medical Association. All rights reserved. The codes documented in this report are preliminary and upon coder review may  be revised to meet current compliance requirements. Justice Britain, MD 09/06/2020 11:04:45 AM Number of Addenda: 0

## 2020-09-06 NOTE — Transfer of Care (Signed)
Immediate Anesthesia Transfer of Care Note  Patient: Nathan Cross  Procedure(s) Performed: ESOPHAGOGASTRODUODENOSCOPY (EGD) WITH PROPOFOL (N/A ) FOREIGN BODY REMOVAL BALLOON DILATION (N/A )  Patient Location: PACU  Anesthesia Type:MAC  Level of Consciousness: awake  Airway & Oxygen Therapy: Patient Spontanous Breathing  Post-op Assessment: Report given to RN and Post -op Vital signs reviewed and stable  Post vital signs: Reviewed and stable  Last Vitals:  Vitals Value Taken Time  BP 105/61 09/06/20 1052  Temp    Pulse 59 09/06/20 1055  Resp 22 09/06/20 1055  SpO2 95 % 09/06/20 1055  Vitals shown include unvalidated device data.  Last Pain:  Vitals:   09/06/20 0916  TempSrc: Oral  PainSc: 0-No pain         Complications: No complications documented.

## 2020-09-06 NOTE — Discharge Instructions (Signed)
YOU HAD AN ENDOSCOPIC PROCEDURE TODAY: Refer to the procedure report and other information in the discharge instructions given to you for any specific questions about what was found during the examination. If this information does not answer your questions, please call [place] office at [phone] to clarify.   YOU SHOULD EXPECT: Some feelings of bloating in the abdomen. Passage of more gas than usual. Walking can help get rid of the air that was put into your GI tract during the procedure and reduce the bloating. If you had a lower endoscopy (such as a colonoscopy or flexible sigmoidoscopy) you may notice spotting of blood in your stool or on the toilet paper. Some abdominal soreness may be present for a day or two, also.  DIET: Your first meal following the procedure should be a light meal and then it is ok to progress to your normal diet. A half-sandwich or bowl of soup is an example of a good first meal. Heavy or fried foods are harder to digest and may make you feel nauseous or bloated. Drink plenty of fluids but you should avoid alcoholic beverages for 24 hours. If you had a esophageal dilation, please see attached instructions for diet.    ACTIVITY: Your care partner should take you home directly after the procedure. You should plan to take it easy, moving slowly for the rest of the day. You can resume normal activity the day after the procedure however YOU SHOULD NOT DRIVE, use power tools, machinery or perform tasks that involve climbing or major physical exertion for 24 hours (because of the sedation medicines used during the test).   SYMPTOMS TO REPORT IMMEDIATELY: A gastroenterologist can be reached at any hour. Please call [phone]  for any of the following symptoms:   Following upper endoscopy (EGD, EUS, ERCP, esophageal dilation) Vomiting of blood or coffee ground material  New, significant abdominal pain  New, significant chest pain or pain under the shoulder blades  Painful or  persistently difficult swallowing  New shortness of breath  Black, tarry-looking or red, bloody stools  FOLLOW UP:  If any biopsies were taken you will be contacted by phone or by letter within the next 1-3 weeks. Call [phone]  if you have not heard about the biopsies in 3 weeks.  Please also call with any specific questions about appointments or follow up tests.  

## 2020-09-06 NOTE — Anesthesia Postprocedure Evaluation (Signed)
Anesthesia Post Note  Patient: Nathan Cross  Procedure(s) Performed: ESOPHAGOGASTRODUODENOSCOPY (EGD) WITH PROPOFOL (N/A ) FOREIGN BODY REMOVAL BALLOON DILATION (N/A )     Patient location during evaluation: Endoscopy Anesthesia Type: MAC Level of consciousness: awake and alert Pain management: pain level controlled Vital Signs Assessment: post-procedure vital signs reviewed and stable Respiratory status: spontaneous breathing, nonlabored ventilation and respiratory function stable Cardiovascular status: stable and blood pressure returned to baseline Postop Assessment: no apparent nausea or vomiting Anesthetic complications: no   No complications documented.  Last Vitals:  Vitals:   09/06/20 1100 09/06/20 1110  BP: 118/64 128/80  Pulse: (!) 59 (!) 55  Resp: (!) 28 13  Temp:    SpO2: 93% 96%    Last Pain:  Vitals:   09/06/20 1053  TempSrc: Axillary  PainSc: 0-No pain                 Beverley Sherrard,W. EDMOND

## 2020-09-07 ENCOUNTER — Encounter (HOSPITAL_COMMUNITY): Payer: Self-pay | Admitting: Gastroenterology

## 2020-09-21 ENCOUNTER — Other Ambulatory Visit: Payer: Self-pay | Admitting: Gastroenterology

## 2020-10-12 ENCOUNTER — Telehealth: Payer: Self-pay | Admitting: Internal Medicine

## 2020-10-12 NOTE — Telephone Encounter (Signed)
Pt called requesting to see Dr. Hilarie Fredrickson asap. He stated that since his last EGD he feels like he went backwards in his progress and would like to discuss it with Dr. Hilarie Fredrickson. Pls call him.

## 2020-10-12 NOTE — Telephone Encounter (Signed)
Left message for pt to call back  °

## 2020-10-12 NOTE — Telephone Encounter (Signed)
Pt states he is having some abd discomfort following his last dilation and wants to see Dr. Hilarie Fredrickson. Pt scheduled to see Dr. Hilarie Fredrickson 10/19/20@10 :10am. Pt aware of appt.

## 2020-10-19 ENCOUNTER — Ambulatory Visit (INDEPENDENT_AMBULATORY_CARE_PROVIDER_SITE_OTHER): Payer: Medicare Other | Admitting: Internal Medicine

## 2020-10-19 ENCOUNTER — Encounter: Payer: Self-pay | Admitting: Internal Medicine

## 2020-10-19 VITALS — BP 120/64 | HR 68 | Ht 75.0 in | Wt 267.2 lb

## 2020-10-19 DIAGNOSIS — R150 Incomplete defecation: Secondary | ICD-10-CM | POA: Diagnosis not present

## 2020-10-19 DIAGNOSIS — K315 Obstruction of duodenum: Secondary | ICD-10-CM | POA: Diagnosis not present

## 2020-10-19 DIAGNOSIS — R1032 Left lower quadrant pain: Secondary | ICD-10-CM | POA: Diagnosis not present

## 2020-10-19 MED ORDER — CIPROFLOXACIN HCL 500 MG PO TABS: 500.0000 mg | ORAL_TABLET | Freq: Two times a day (BID) | ORAL | 0 refills | Status: AC

## 2020-10-19 MED ORDER — METRONIDAZOLE 250 MG PO TABS: 250.0000 mg | ORAL_TABLET | Freq: Three times a day (TID) | ORAL | 0 refills | Status: AC

## 2020-10-19 NOTE — Patient Instructions (Signed)
We have sent the following medications to your pharmacy for you to pick up at your convenience: cipro 500 mg twice daily x 7 days Flagyl 250 mg three times daily x 7 days  Call our office in 10 days to let us know if your left sided lower abdominal pain has improved after the cipro and flagyl treatment.  Continue omeprazole.  If you are age 68 or older, your body mass index should be between 23-30. Your Body mass index is 33.4 kg/m. If this is out of the aforementioned range listed, please consider follow up with your Primary Care Provider.  Due to recent changes in healthcare laws, you may see the results of your imaging and laboratory studies on MyChart before your provider has had a chance to review them.  We understand that in some cases there may be results that are confusing or concerning to you. Not all laboratory results come back in the same time frame and the provider may be waiting for multiple results in order to interpret others.  Please give [place] 48 hours in order for your provider to thoroughly review all the results before contacting the office for clarification of your results.

## 2020-10-19 NOTE — Progress Notes (Signed)
Subjective:    Patient ID: Nathan Cross, male    DOB: Aug 22, 1950, 70 y.o.   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  He is here alone today.  He has had 3 upper endoscopies with balloon dilation of duodenal stenosis performed by Dr. [name].  His last was 09/06/2020.  At this last EGD he had an irregular Z-line, striped gastritis without bleeding and a moderate mount of food residue in the duodenal bulb.  There was a acquired benign intrinsic moderate stenosis at the apex of the duodenal bulb extending to the duodenal sweep.  This was able to be traversed with the upper endoscope and it was then dilated to 15 mm with balloon.  There was good treatment effect.  He reports that he has had resolution of his upper GI outlet obstruction type symptoms which for him were belching, upper abdominal bloating and discomfort.  He reports he is able to eat pretty much what he wants including salads, corn and beans.  New symptom for him and really starting several days after his last upper endoscopy is a lower and left lower burning abdominal discomfort.  This is also been associated with an incomplete evacuation symptom.  He has bowel movements but he feels like there is more that just will not come out.  He is tried [place] and also took a mag citrate bottle which caused diarrhea but did not exactly improve his left lower quadrant discomfort.  He ended up taking Imodium after the diarrhea somewhat persisted after mag citrate.  He has had no blood in stool or melena.  No fevers.   Review of Systems As per HPI, otherwise negative  Current Medications, Allergies, Past Medical History, Past Surgical History, Family History and Social History  were reviewed in [employer] record.     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   1.  Left lower quadrant pain/incomplete defecation --I am suspicious for diverticular inflammation, possibly mild smoldering diverticulitis.  I am going to treat with antibiotics.  If no better consider bowel purge, probiotic and addition of Benefiber. -- Cipro 500 mg twice daily and metronidazole 250 mg 3 times daily x7 days -- Patient asked to call me in about 10 days to see if symptoms have improved  2.  Duodenal stenosis --acquired to benign.  No ongoing symptoms of gastric outlet obstruction.  He should remain on omeprazole 40 mg daily indefinitely and avoid NSAIDs strictly.  We would only repeat upper endoscopy for dilation if upper GI outlet obstruction symptoms  return. -- Omeprazole 40 mg daily -- He is tolerating regular diet -- Repeat dilation in the future as symptoms warrant  3.  History of adenomatous colon polyps --colonoscopy without adenoma or polyp March 2021 -- Surveillance colonoscopy recommended March 2026  30 minutes total spent today including patient facing time, coordination of care, reviewing medical history/procedures/pertinent radiology studies, and documentation of the  encounter.

## 2020-11-01 ENCOUNTER — Telehealth: Payer: Self-pay | Admitting: Internal Medicine

## 2020-11-01 NOTE — Telephone Encounter (Signed)
Noted  

## 2020-11-01 NOTE — Telephone Encounter (Signed)
Pt calling with an update, letting Dr. Hilarie Fredrickson know that he finished the cipro and flagyl, he is not having any pain or bloating. He may still have a little crampy feeling at times and he takes probiotics. Dr. Hilarie Fredrickson aware.

## 2020-11-01 NOTE — Telephone Encounter (Signed)
Great, thanks. He can call back if symptoms return

## 2020-11-01 NOTE — Telephone Encounter (Signed)
Pls call pt, he needs to give you an update on his progress.

## 2021-03-24 ENCOUNTER — Other Ambulatory Visit: Payer: Self-pay | Admitting: Gastroenterology

## 2022-01-27 IMAGING — RF DG UGI W/ HIGH DENSITY W/O KUB
10 of 19 series · 12 of 24 positions shown · non-contrast
Comparison: No pertinent prior exams are available for comparison.

CLINICAL DATA: Dysphagia, unspecified type. Additional history
obtained from electronic medical record: Recent endoscopy
demonstrating benign duodenal stricture.

EXAM:
UPPER GI SERIES WITH KUB
TECHNIQUE: After obtaining a scout radiograph a routine upper GI series was
performed using thin and high density barium.
FLUOROSCOPY TIME:  Fluoroscopy Time:  3 minutes, 12 seconds.
Radiation Exposure Index (if provided by the fluoroscopic device):
111 mGy
Number of Acquired Spot Images: 11

[Series 2: cp_standard · 0.34mm/px · 2 of 70 frames shown (1 of 6)]
[frame 11/70]
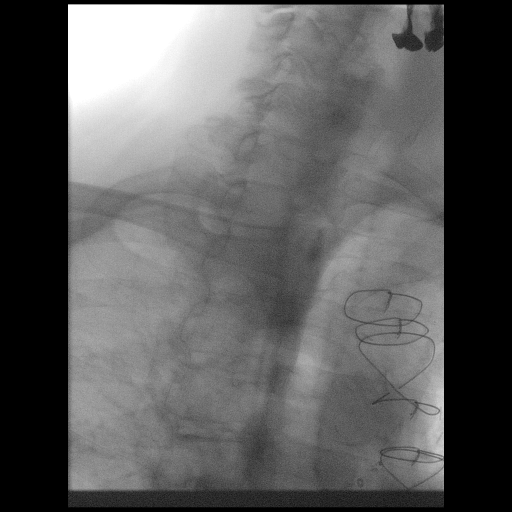
[frame 60/70]
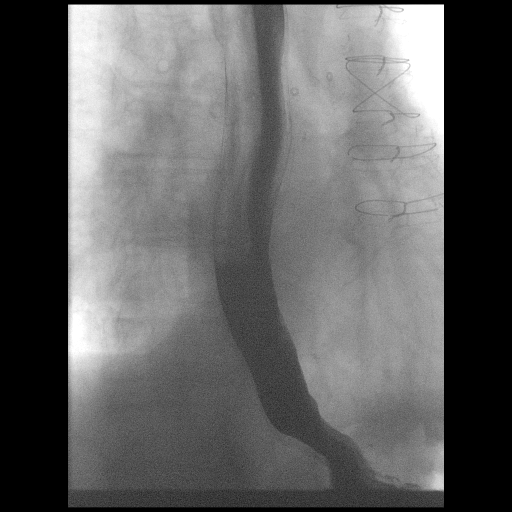

[Series 4: cp_standard · 0.17mm/px · 1 of 1 slices shown (2 of 6)]
[im 1/1]
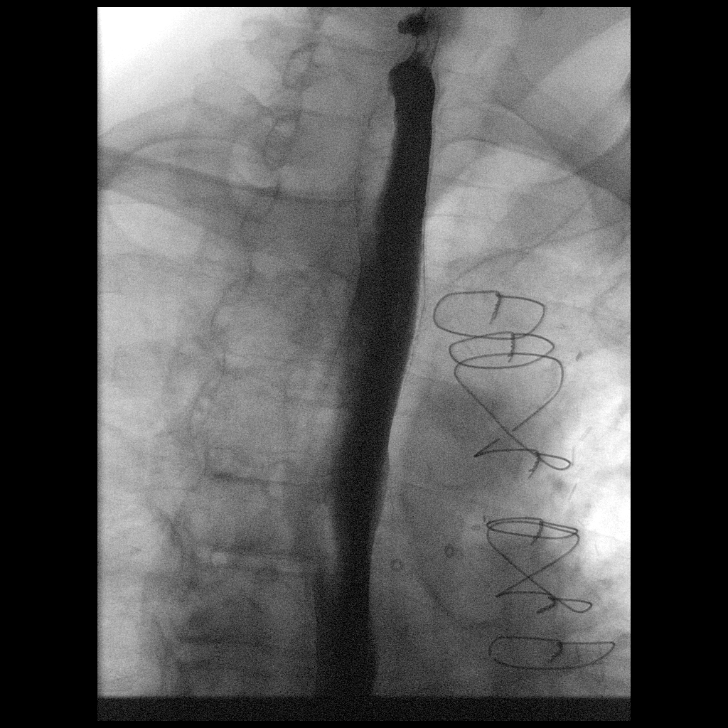

[Series 7: fluoro_barium singleshot_bw · 0.17mm/px · 1 of 1 slices shown (1 of 4)]
[im 1/1]
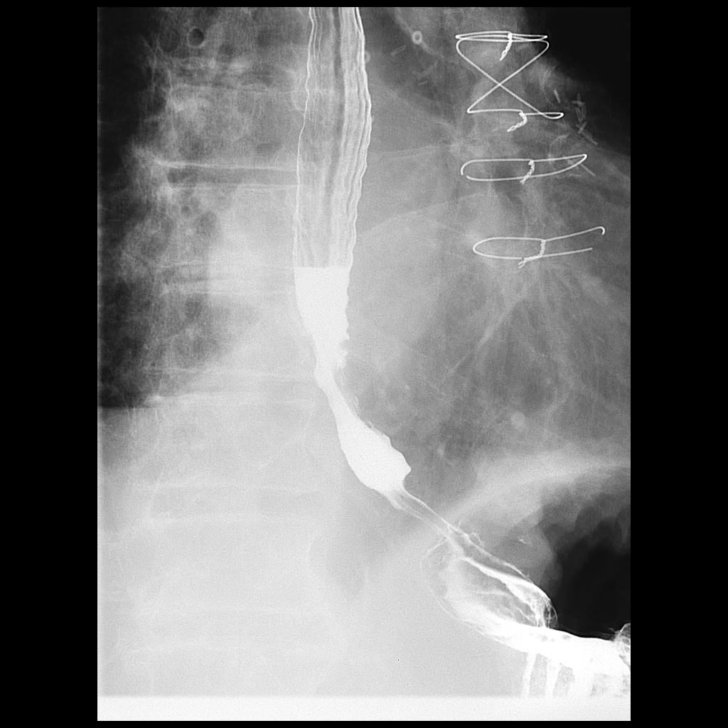

[Series 8: cp_standard · 0.34mm/px · 2 of 90 frames shown (3 of 6)]
[frame 26/90]
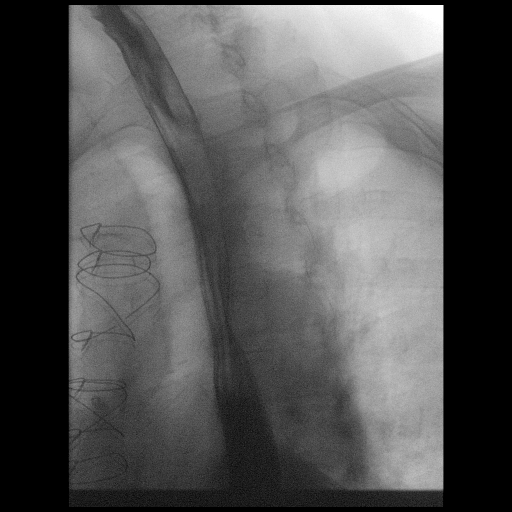
[frame 77/90]
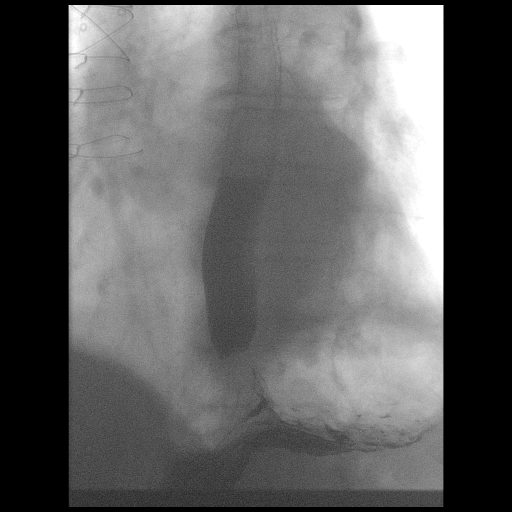

[Series 11: cp_standard · 0.17mm/px · 1 of 1 slices shown (4 of 6)]
[im 1/1]
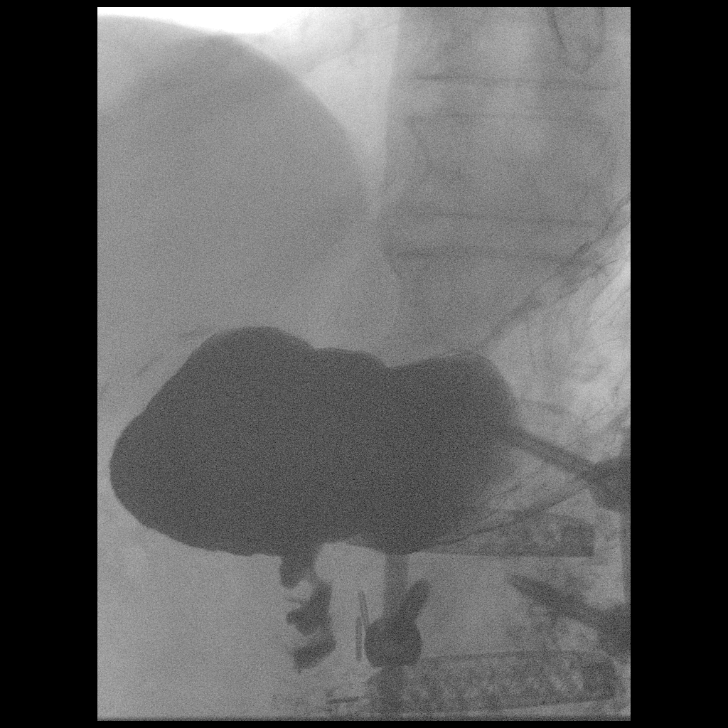

[Series 13: cp_standard · 0.17mm/px · 1 of 1 slices shown (5 of 6)]
[im 1/1]
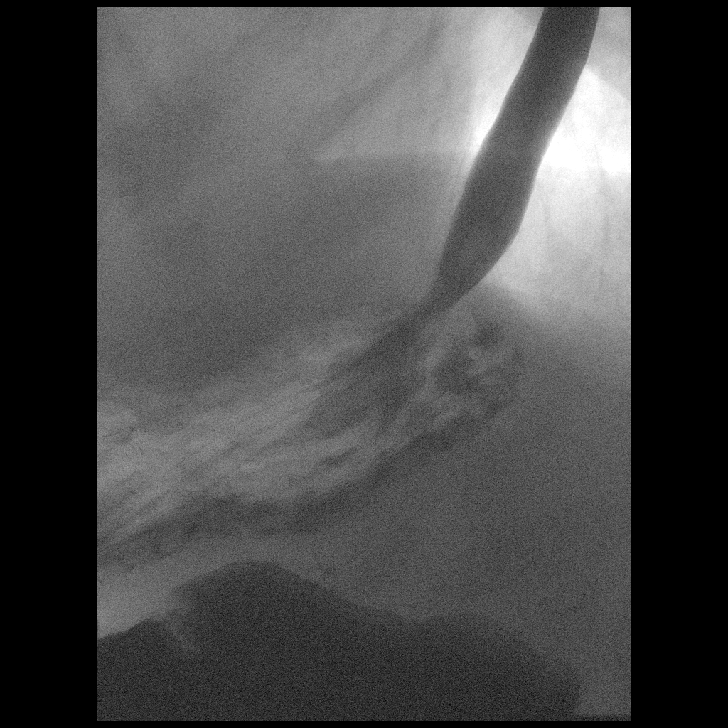

[Series 16: fluoro_barium singleshot_bw · 0.17mm/px · 1 of 1 slices shown (2 of 4)]
[im 1/1]
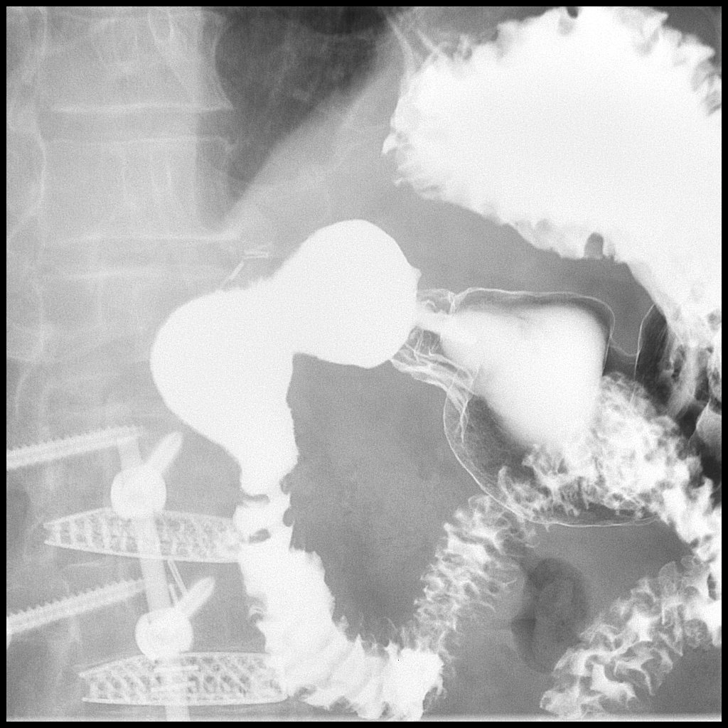

[Series 18: cp_standard · 0.17mm/px · 1 of 1 slices shown (6 of 6)]
[im 1/1]
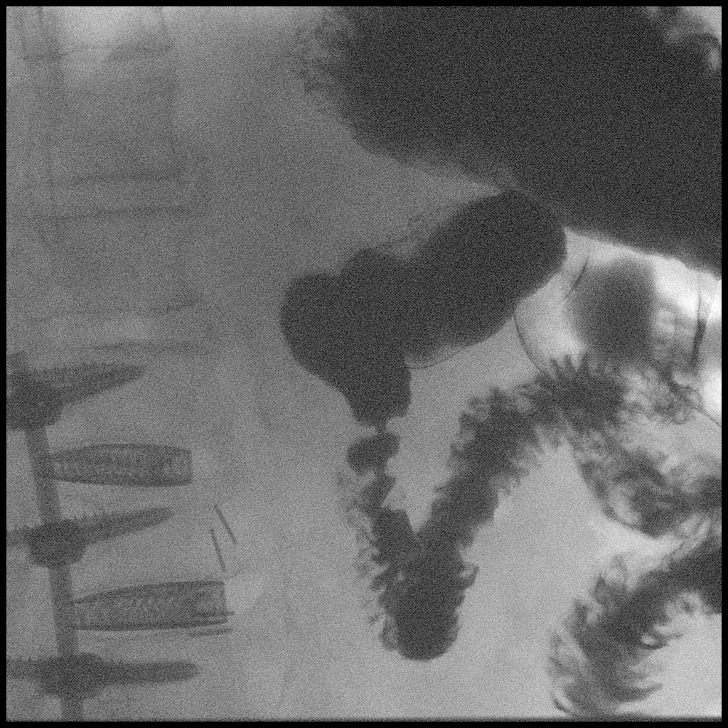

[Series 21: fluoro_barium singleshot_bw · 0.17mm/px · 1 of 1 slices shown (3 of 4)]
[im 1/1]
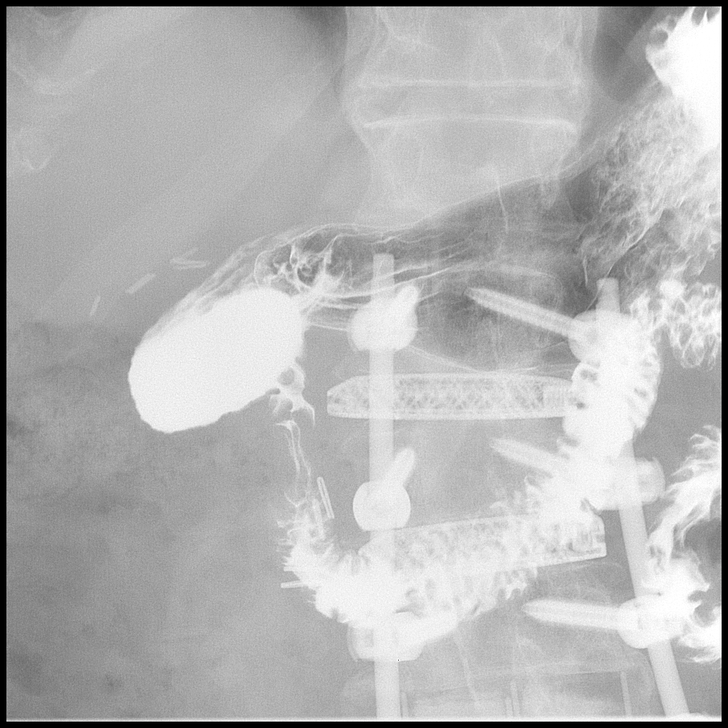

[Series 23: fluoro_barium singleshot_bw · 0.17mm/px · 1 of 1 slices shown (4 of 4)]
[im 1/1]
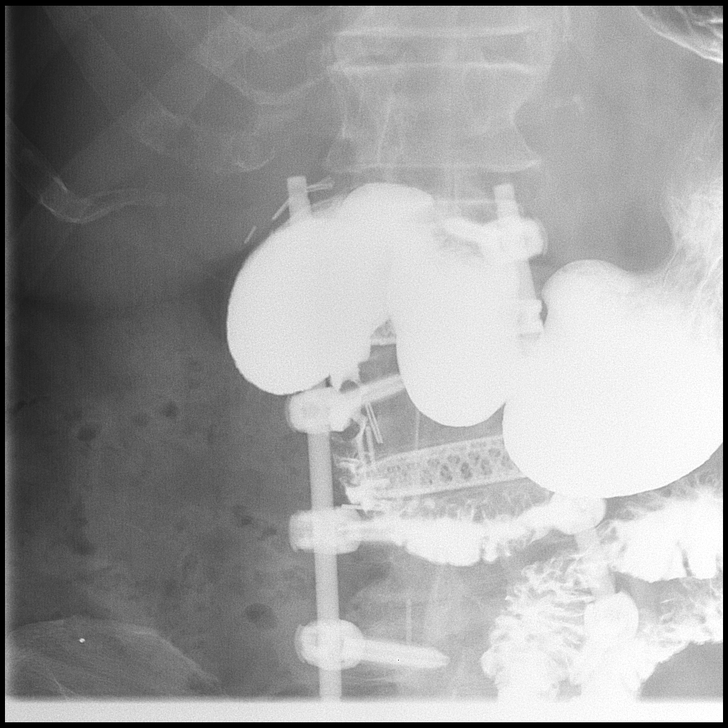

[12 of 24 positions shown; findings below may reference images not displayed]

FINDINGS: A scout radiograph of the abdomen and lower chest demonstrates a
nonobstructive bowel gas pattern. Lumbar spinal fusion construct.
Surgical clips within the right hemiabdomen. Prior median
sternotomy.

There is a small outpouching arising posteriorly at the
pharyngo-esophageal junction suspicious for Zenker diverticulum.
This finding was not appreciated in real-time, but was subsequently
noted on postprocedural review of the acquired images.

There is esophagus is otherwise normal in caliber and smooth in
contour. No evidence of fixed stricture, mass or mucosal
abnormality. Normal esophageal motility was observed. No hiatal
hernia. No gastroesophageal reflux was observed. The patient
swallowed a 13 mm barium tablet, which freely passed into the
stomach.

There is an apparent fixed narrowing at the junction of the pylorus
and first portion of the duodenum. However, there is rapid transit
of contrast from the distal stomach into the proximal small bowel.
The stomach, duodenal bulb and duodenal sweep are otherwise
unremarkable.
IMPRESSION: Apparent fixed narrowing at the junction of the pylorus and first
portion of the duodenum consistent with the patient's history of
duodenal stricture. However, there is rapid transit of contrast from
the distal stomach into the proximal small bowel.

Suspected small Zenker diverticulum.

Otherwise unremarkable examination as described.

## 2022-04-17 IMAGING — RF DG ABDOMEN 1V
1 series · 13 of 13 positions shown · non-contrast
Comparison: None.

CLINICAL DATA: Biliary stricture

EXAM:
DG C-ARM 1-60 MIN; ABDOMEN - 1 VIEW
FLUOROSCOPY TIME:  Fluoroscopy Time: 3 minutes 24 seconds
Number of Acquired Spot Images: 11 acquired images

[Series 1: run · 13 of 13 slices shown]
[im 1/13]
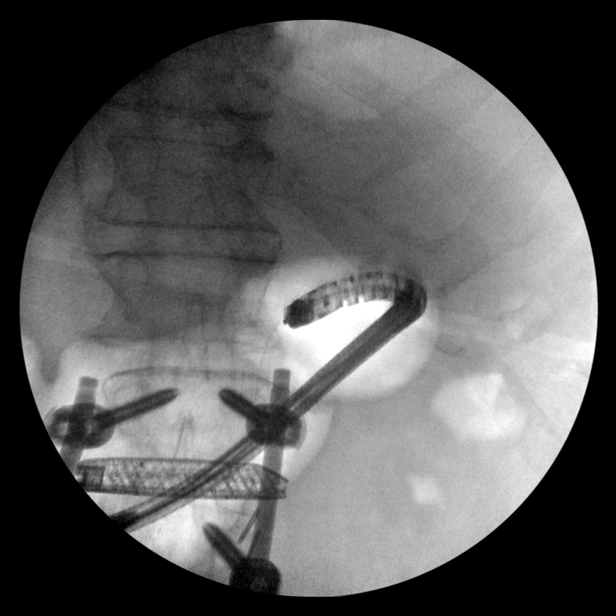
[im 2/13]
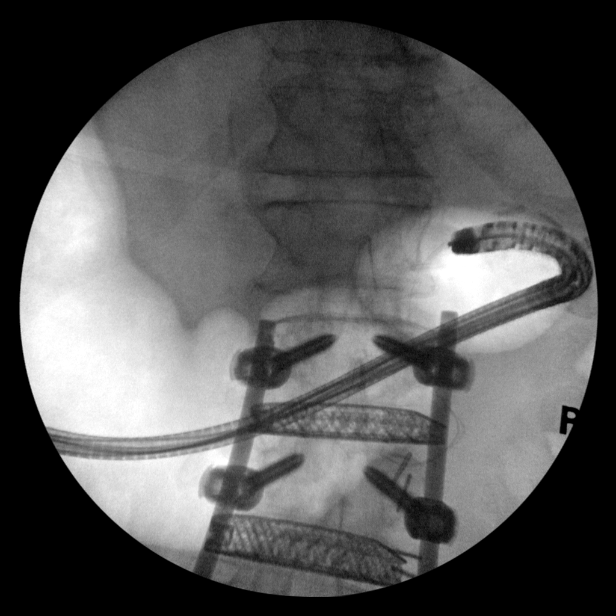
[im 3/13]
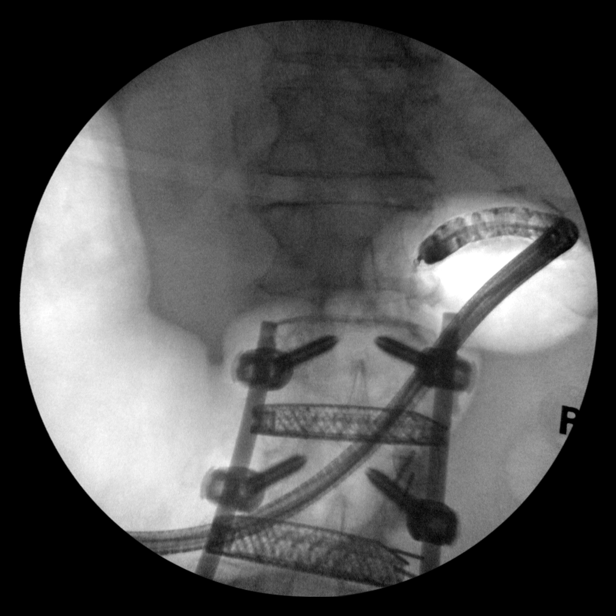
[im 4/13]
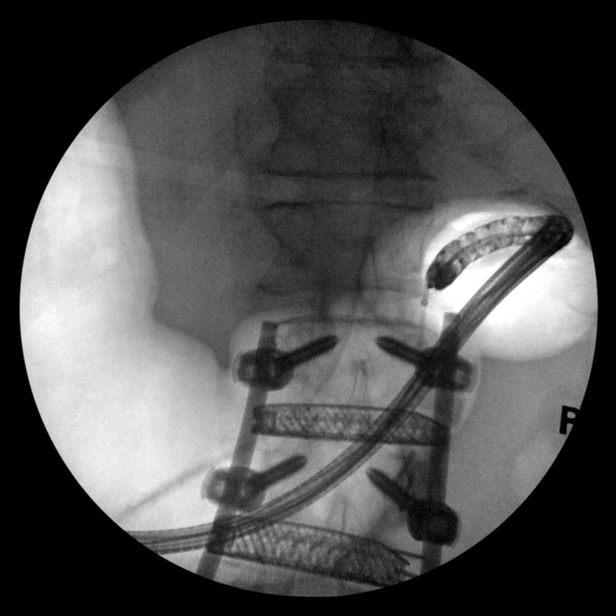
[im 5/13]
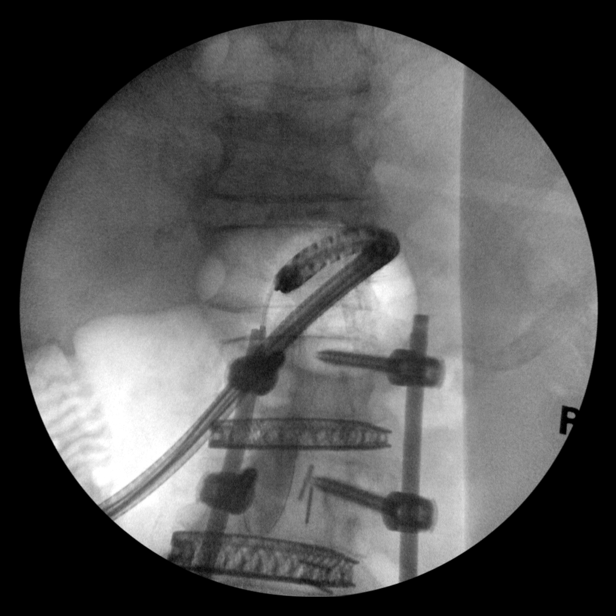
[im 6/13]
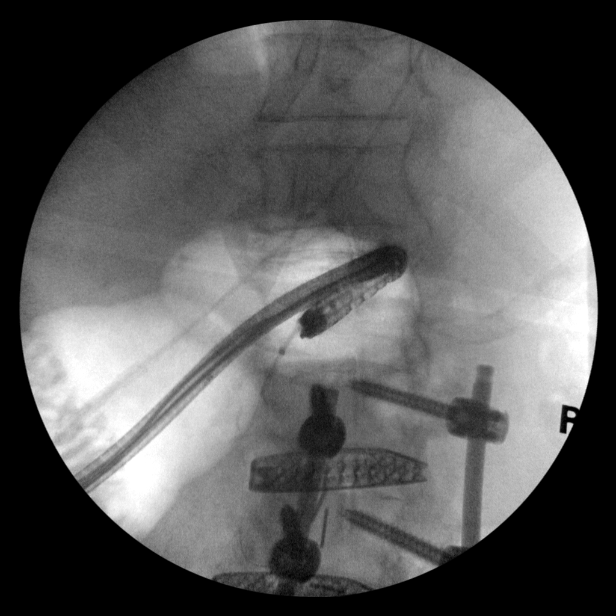
[im 7/13]
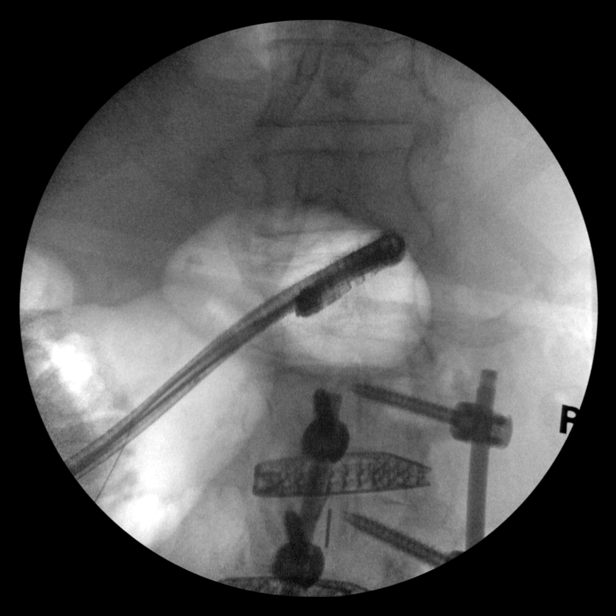
[im 8/13]
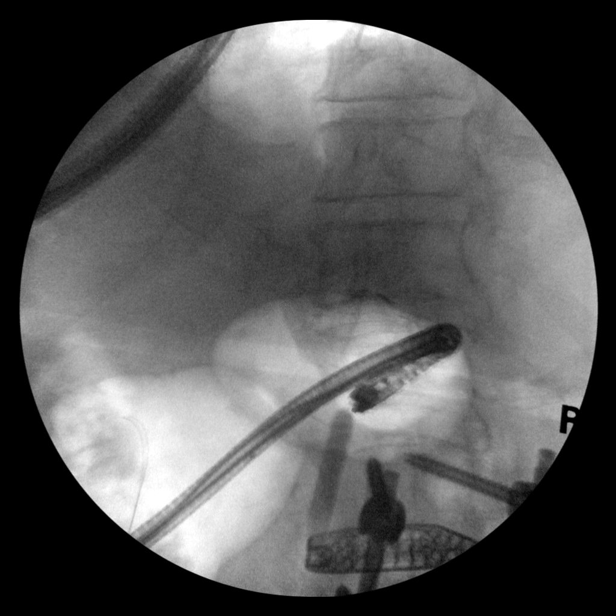
[im 9/13]
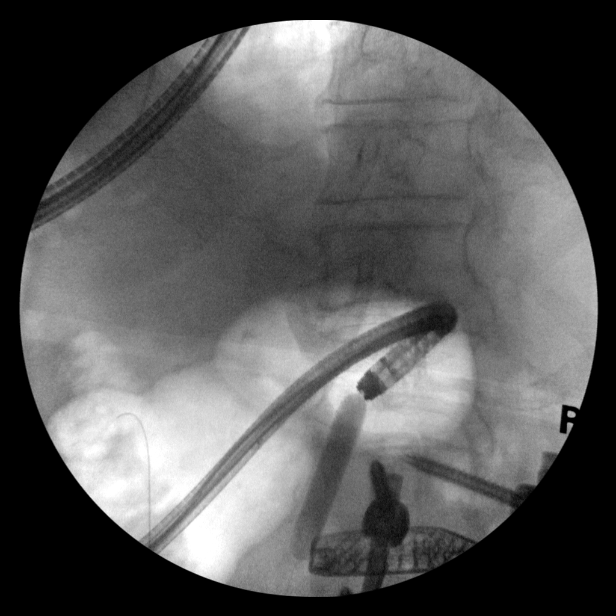
[im 10/13]
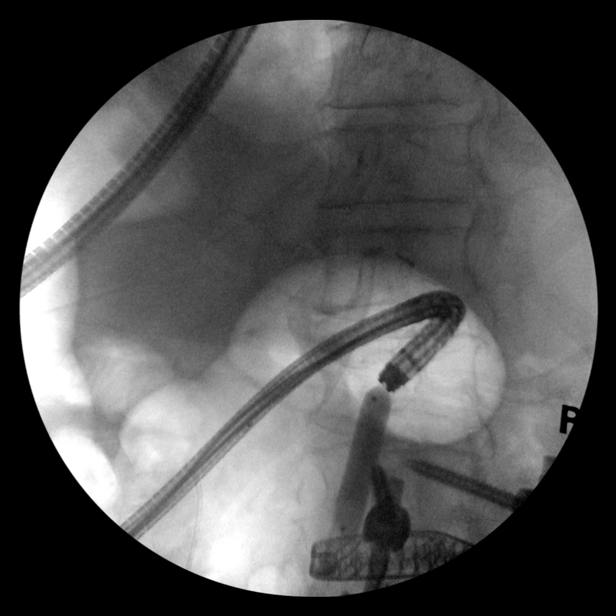
[im 11/13]
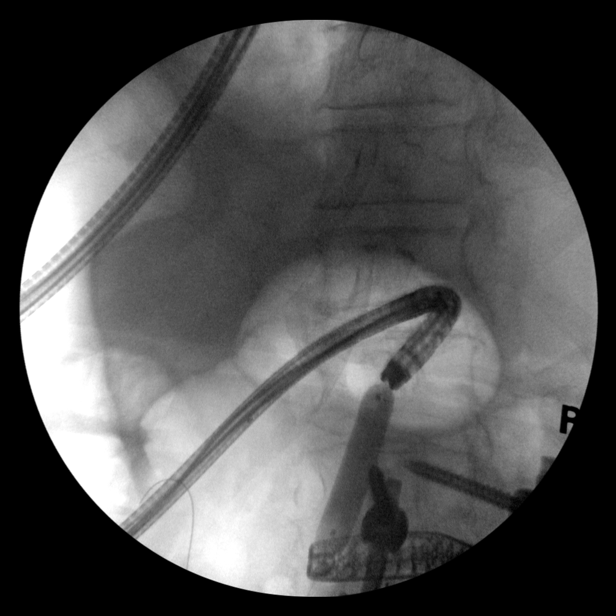
[im 12/13]
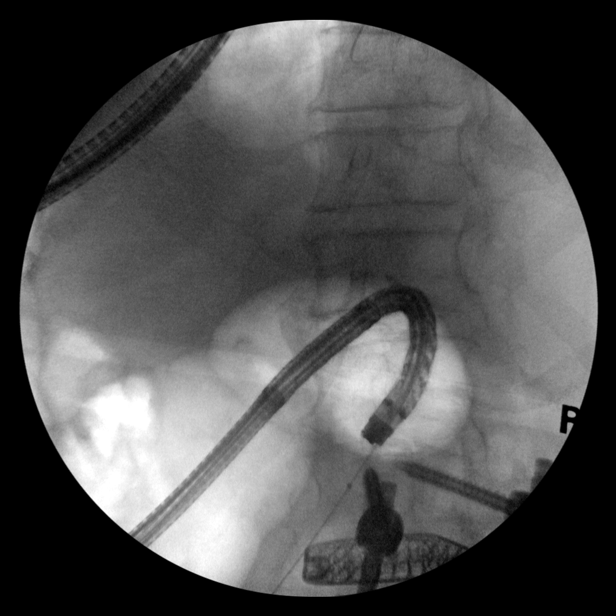
[im 13/13]
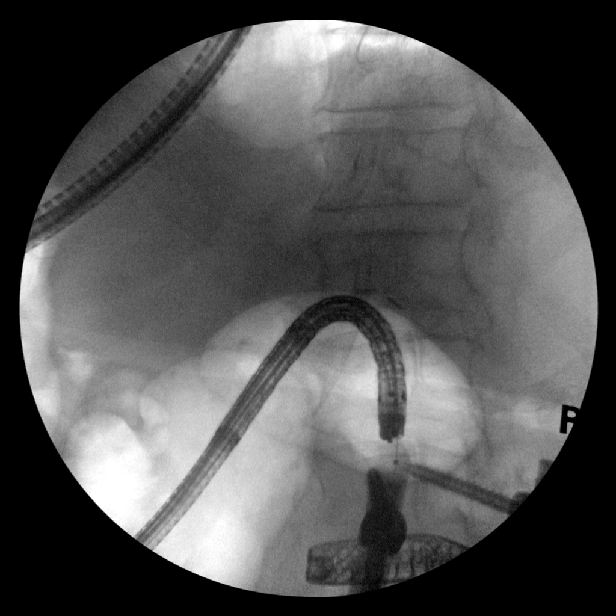

[13 of 13 positions shown; findings below may reference images not displayed]

FINDINGS: A series of intraoperative images show endoscope tip near the
ampulla. A catheter was placed across the distal common bile duct
with balloon dilatation evident. Visualized bowel gas pattern
unremarkable.
IMPRESSION: Series of images shows apparent balloon dilatation of the distal
common bile duct. Visualized bowel gas pattern normal.

## 2022-11-21 ENCOUNTER — Telehealth: Payer: Self-pay | Admitting: Internal Medicine

## 2022-11-21 NOTE — Telephone Encounter (Signed)
Inbound call from patient, would like to discuss esophageal dilation with Dr. Rhea Belton. He states the procedure has been done before and would like to discuss scheduling the procedure since it is not the first time he has had it. States he lives in [place], and would like to schedule the endoscopy directly.

## 2022-11-21 NOTE — Telephone Encounter (Signed)
Pt states he is having bloating and discomfort and feels like his esophagus needs to be dilated again. He would like to have a direct EGD if possible since he lives in [place]. He is aware that Dr. Rhea Belton will not be back in the office until Friday and he will reply upon his return. Please advise.

## 2022-11-24 NOTE — Telephone Encounter (Signed)
GM,  Given your experience here could you help with repeat duodenal dilation? Previous dilations have been very helpful Thanks JMP

## 2022-11-26 NOTE — Telephone Encounter (Signed)
We can try to add him on June 13. If this does not work he is can have to wait till August because I am saving the rest of July slots for procedures since we are at the beginning of June and we have limited availability in July. Otherwise offer him August 5 onwards. Thanks. GM

## 2022-11-27 NOTE — Telephone Encounter (Signed)
Can you see if he can come for duodenal dilation with GM on 12/07/22? Hope so, otherwise it will be Aug Thanks JMP

## 2022-11-27 NOTE — Telephone Encounter (Signed)
Patient wishing to discuss 6/13 appointment in previous notes. Please advise, thank you.

## 2022-11-27 NOTE — Telephone Encounter (Signed)
Nathan Cross pt calling asking about appt with GM 6/13.  Nathan Fiedler, MD  Physician Gastroenterology   Telephone Encounter Signed   Creation Time: 11/27/2022  8:28 AM   Signed     Can you see if Nathan Cross can come for duodenal dilation with GM on 12/07/22? Hope so, otherwise it will be Aug Thanks JMP

## 2022-11-27 NOTE — Telephone Encounter (Signed)
There are no sooner appts. Please encourage the pt to call and check for cancellations.

## 2022-11-27 NOTE — Telephone Encounter (Signed)
Patient called regarding sooner appointment. Requesting a call back to discuss. Please advise, thank you.

## 2022-11-29 ENCOUNTER — Other Ambulatory Visit: Payer: Self-pay

## 2022-11-29 ENCOUNTER — Telehealth: Payer: Self-pay

## 2022-11-29 DIAGNOSIS — K315 Obstruction of duodenum: Secondary | ICD-10-CM

## 2022-11-29 NOTE — Telephone Encounter (Signed)
[facility] Pre-operative Risk Assessment     Request for surgical clearance:     Endoscopy Procedure  What type of surgery is being performed?     EGD   When is this surgery scheduled?     12/11/22  What type of clearance is required ?   Pharmacy  Are there any medications that need to be held prior to surgery and how long? Xarelto  Practice name and name of physician performing surgery?      [facility]  What is your office phone and fax number?      Phone- [phone]  Fax- [phone]  Anesthesia type (None, local, MAC, general) ?       MAC

## 2022-11-29 NOTE — Telephone Encounter (Signed)
There are no other spots on 6/13.  Do you want me to get him set up for the spot that opened on 6/17?

## 2022-11-29 NOTE — Telephone Encounter (Signed)
Thanks Patty for update. GM 

## 2022-11-29 NOTE — Telephone Encounter (Signed)
I went ahead and set him up for 12/11/22 at 11 am at [facility] with GM.  Anti coag letter has been sent to cardiology.    EUS scheduled, pt instructed and medications reviewed.  Patient instructions mailed to home.  Patient to call with any questions or concerns.

## 2022-11-30 ENCOUNTER — Encounter (HOSPITAL_COMMUNITY): Payer: Self-pay | Admitting: Gastroenterology

## 2022-12-01 NOTE — Telephone Encounter (Signed)
Pt follows with [facility], has never seen [facility] before. Clearance should come from managing physician.

## 2022-12-01 NOTE — Telephone Encounter (Signed)
I have faxed a letter to Dr Georgiana Spinner for anti coag hold.

## 2022-12-04 ENCOUNTER — Telehealth: Payer: Self-pay

## 2022-12-04 NOTE — Telephone Encounter (Signed)
-----   Message from Loretha Stapler, RN sent at 12/01/2022  1:05 PM EDT ----- Waiting for anti coag hold from Dr Elouise Munroe 6/17 appt

## 2022-12-04 NOTE — Telephone Encounter (Signed)
Left message on machine to call back at Dr Elouise Munroe office as well as pt home

## 2022-12-05 NOTE — Telephone Encounter (Signed)
The pt returned call and states he has not heard from that office in regards to blood thinner. He will also call and try to get a response.

## 2022-12-06 NOTE — Telephone Encounter (Signed)
Recd clearance letter back from Dr.Zakhary, okay for patient to hold Xarelto 48 hours prior to procedure. Patient has been informed and voiced understanding. Clearance letter has been sent to scanning.

## 2022-12-11 ENCOUNTER — Ambulatory Visit (HOSPITAL_COMMUNITY)
Admission: RE | Admit: 2022-12-11 | Discharge: 2022-12-11 | Disposition: A | Discharge status: Home / Self Care | Payer: Medicare Other | Attending: Gastroenterology | Admitting: Gastroenterology

## 2022-12-11 ENCOUNTER — Ambulatory Visit (HOSPITAL_BASED_OUTPATIENT_CLINIC_OR_DEPARTMENT_OTHER): Payer: Medicare Other | Admitting: Certified Registered"

## 2022-12-11 ENCOUNTER — Telehealth: Payer: Self-pay

## 2022-12-11 ENCOUNTER — Ambulatory Visit (HOSPITAL_COMMUNITY): Payer: Medicare Other | Admitting: Certified Registered"

## 2022-12-11 ENCOUNTER — Encounter (HOSPITAL_COMMUNITY)
Admission: RE | Disposition: A | Discharge status: Home / Self Care | Payer: Self-pay | Source: Home / Self Care | Attending: Gastroenterology

## 2022-12-11 ENCOUNTER — Encounter (HOSPITAL_COMMUNITY): Payer: Self-pay | Admitting: Gastroenterology

## 2022-12-11 ENCOUNTER — Other Ambulatory Visit: Payer: Self-pay

## 2022-12-11 DIAGNOSIS — I252 Old myocardial infarction: Secondary | ICD-10-CM | POA: Insufficient documentation

## 2022-12-11 DIAGNOSIS — K315 Obstruction of duodenum: Secondary | ICD-10-CM | POA: Diagnosis present

## 2022-12-11 DIAGNOSIS — I110 Hypertensive heart disease with heart failure: Secondary | ICD-10-CM | POA: Insufficient documentation

## 2022-12-11 DIAGNOSIS — T183XXA Foreign body in small intestine, initial encounter: Secondary | ICD-10-CM | POA: Diagnosis not present

## 2022-12-11 DIAGNOSIS — K209 Esophagitis, unspecified without bleeding: Secondary | ICD-10-CM

## 2022-12-11 DIAGNOSIS — F17210 Nicotine dependence, cigarettes, uncomplicated: Secondary | ICD-10-CM | POA: Insufficient documentation

## 2022-12-11 DIAGNOSIS — Z951 Presence of aortocoronary bypass graft: Secondary | ICD-10-CM | POA: Diagnosis not present

## 2022-12-11 DIAGNOSIS — I509 Heart failure, unspecified: Secondary | ICD-10-CM

## 2022-12-11 DIAGNOSIS — T182XXA Foreign body in stomach, initial encounter: Secondary | ICD-10-CM | POA: Diagnosis not present

## 2022-12-11 DIAGNOSIS — Z7985 Long-term (current) use of injectable non-insulin antidiabetic drugs: Secondary | ICD-10-CM | POA: Insufficient documentation

## 2022-12-11 DIAGNOSIS — K2289 Other specified disease of esophagus: Secondary | ICD-10-CM | POA: Diagnosis not present

## 2022-12-11 DIAGNOSIS — W44F3XA Food entering into or through a natural orifice, initial encounter: Secondary | ICD-10-CM | POA: Insufficient documentation

## 2022-12-11 DIAGNOSIS — K2100 Gastro-esophageal reflux disease with esophagitis, without bleeding: Secondary | ICD-10-CM | POA: Diagnosis not present

## 2022-12-11 DIAGNOSIS — E119 Type 2 diabetes mellitus without complications: Secondary | ICD-10-CM | POA: Diagnosis not present

## 2022-12-11 DIAGNOSIS — J449 Chronic obstructive pulmonary disease, unspecified: Secondary | ICD-10-CM | POA: Insufficient documentation

## 2022-12-11 DIAGNOSIS — Z7984 Long term (current) use of oral hypoglycemic drugs: Secondary | ICD-10-CM | POA: Insufficient documentation

## 2022-12-11 DIAGNOSIS — I2510 Atherosclerotic heart disease of native coronary artery without angina pectoris: Secondary | ICD-10-CM

## 2022-12-11 DIAGNOSIS — K2950 Unspecified chronic gastritis without bleeding: Secondary | ICD-10-CM | POA: Diagnosis not present

## 2022-12-11 DIAGNOSIS — K3189 Other diseases of stomach and duodenum: Secondary | ICD-10-CM | POA: Insufficient documentation

## 2022-12-11 DIAGNOSIS — G4730 Sleep apnea, unspecified: Secondary | ICD-10-CM | POA: Diagnosis not present

## 2022-12-11 DIAGNOSIS — I480 Paroxysmal atrial fibrillation: Secondary | ICD-10-CM | POA: Diagnosis not present

## 2022-12-11 DIAGNOSIS — Z4689 Encounter for fitting and adjustment of other specified devices: Secondary | ICD-10-CM

## 2022-12-11 DIAGNOSIS — Z87891 Personal history of nicotine dependence: Secondary | ICD-10-CM

## 2022-12-11 HISTORY — PX: BIOPSY: SHX5522

## 2022-12-11 HISTORY — PX: FOREIGN BODY REMOVAL: SHX962

## 2022-12-11 HISTORY — PX: STENT REMOVAL: SHX6421

## 2022-12-11 HISTORY — DX: Malignant (primary) neoplasm, unspecified: C80.1

## 2022-12-11 HISTORY — PX: ESOPHAGOGASTRODUODENOSCOPY (EGD) WITH PROPOFOL: SHX5813

## 2022-12-11 HISTORY — DX: Unspecified osteoarthritis, unspecified site: M19.90

## 2022-12-11 LAB — GLUCOSE, CAPILLARY: Glucose-Capillary: 100 mg/dL — ABNORMAL HIGH (ref 70–99)

## 2022-12-11 SURGERY — ESOPHAGOGASTRODUODENOSCOPY (EGD) WITH PROPOFOL
Anesthesia: General

## 2022-12-11 MED ORDER — OMEPRAZOLE 40 MG PO CPDR: 40.0000 mg | DELAYED_RELEASE_CAPSULE | Freq: Two times a day (BID) | ORAL | 6 refills | Status: DC

## 2022-12-11 MED ORDER — PROPOFOL 10 MG/ML IV BOLUS
INTRAVENOUS | Status: AC
  Filled 2022-12-11: qty 20

## 2022-12-11 MED ORDER — PHENYLEPHRINE 80 MCG/ML (10ML) SYRINGE FOR IV PUSH (FOR BLOOD PRESSURE SUPPORT)
PREFILLED_SYRINGE | INTRAVENOUS | Status: DC | PRN
  Administered 2022-12-11: 80 ug via INTRAVENOUS

## 2022-12-11 MED ORDER — LACTATED RINGERS IV SOLN
INTRAVENOUS | Status: AC | PRN
  Administered 2022-12-11: 1000 mL via INTRAVENOUS

## 2022-12-11 MED ORDER — SUGAMMADEX SODIUM 200 MG/2ML IV SOLN
INTRAVENOUS | Status: DC | PRN
  Administered 2022-12-11: 200 mg via INTRAVENOUS

## 2022-12-11 MED ORDER — SUCRALFATE 1 G PO TABS: 1.0000 g | ORAL_TABLET | Freq: Two times a day (BID) | ORAL | 0 refills | Status: DC

## 2022-12-11 MED ORDER — LIDOCAINE 2% (20 MG/ML) 5 ML SYRINGE
INTRAMUSCULAR | Status: DC | PRN
  Administered 2022-12-11: 100 mg via INTRAVENOUS

## 2022-12-11 MED ORDER — DEXAMETHASONE SODIUM PHOSPHATE 10 MG/ML IJ SOLN
INTRAMUSCULAR | Status: DC | PRN
  Administered 2022-12-11: 5 mg via INTRAVENOUS

## 2022-12-11 MED ORDER — PROPOFOL 500 MG/50ML IV EMUL
INTRAVENOUS | Status: AC
  Filled 2022-12-11: qty 50

## 2022-12-11 MED ORDER — GLYCOPYRROLATE 0.2 MG/ML IJ SOLN
INTRAMUSCULAR | Status: DC | PRN
  Administered 2022-12-11 (×2): .1 mg via INTRAVENOUS

## 2022-12-11 MED ORDER — EPHEDRINE SULFATE-NACL 50-0.9 MG/10ML-% IV SOSY
PREFILLED_SYRINGE | INTRAVENOUS | Status: DC | PRN
  Administered 2022-12-11: 5 mg via INTRAVENOUS
  Administered 2022-12-11: 10 mg via INTRAVENOUS
  Administered 2022-12-11 (×2): 5 mg via INTRAVENOUS
  Administered 2022-12-11: 10 mg via INTRAVENOUS

## 2022-12-11 MED ORDER — XARELTO 20 MG PO TABS: 20.0000 mg | ORAL_TABLET | Freq: Every day | ORAL | 0 refills | Status: DC

## 2022-12-11 MED ORDER — ONDANSETRON HCL 4 MG/2ML IJ SOLN
INTRAMUSCULAR | Status: DC | PRN
  Administered 2022-12-11: 4 mg via INTRAVENOUS

## 2022-12-11 MED ORDER — FENTANYL CITRATE (PF) 100 MCG/2ML IJ SOLN
INTRAMUSCULAR | Status: DC | PRN
  Administered 2022-12-11: 50 ug via INTRAVENOUS

## 2022-12-11 MED ORDER — PROPOFOL 10 MG/ML IV BOLUS
INTRAVENOUS | Status: DC | PRN
  Administered 2022-12-11: 150 mg via INTRAVENOUS
  Administered 2022-12-11: 20 mg via INTRAVENOUS
  Administered 2022-12-11: 30 mg via INTRAVENOUS

## 2022-12-11 MED ORDER — ROCURONIUM BROMIDE 100 MG/10ML IV SOLN
INTRAVENOUS | Status: DC | PRN
  Administered 2022-12-11: 10 mg via INTRAVENOUS
  Administered 2022-12-11: 20 mg via INTRAVENOUS

## 2022-12-11 MED ORDER — SUCCINYLCHOLINE CHLORIDE 200 MG/10ML IV SOSY
PREFILLED_SYRINGE | INTRAVENOUS | Status: DC | PRN
  Administered 2022-12-11: 140 mg via INTRAVENOUS

## 2022-12-11 MED ORDER — FENTANYL CITRATE (PF) 100 MCG/2ML IJ SOLN
INTRAMUSCULAR | Status: AC
  Filled 2022-12-11: qty 2

## 2022-12-11 SURGICAL SUPPLY — 15 items

## 2022-12-11 NOTE — Transfer of Care (Signed)
Immediate Anesthesia Transfer of Care Note  Patient: Burrel Nardozzi  Procedure(s) Performed: ESOPHAGOGASTRODUODENOSCOPY (EGD) WITH PROPOFOL FOREIGN BODY REMOVAL BIOPSY AXIOS STENT PLACEMENT  Patient Location: PACU and Endoscopy Unit  Anesthesia Type:General  Level of Consciousness: awake, alert , and oriented  Airway & Oxygen Therapy: Patient Spontanous Breathing  Post-op Assessment: Report given to RN and Post -op Vital signs reviewed and stable  Post vital signs: Reviewed and stable  Last Vitals:  Vitals Value Taken Time  BP    Temp    Pulse 75 12/11/22 1118  Resp 14 12/11/22 1118  SpO2 93 % 12/11/22 1118  Vitals shown include unvalidated device data.  Last Pain:  Vitals:   12/11/22 0949  TempSrc: Tympanic  PainSc: 0-No pain         Complications: No notable events documented.

## 2022-12-11 NOTE — Anesthesia Preprocedure Evaluation (Addendum)
Anesthesia Evaluation  Patient identified by MRN, date of birth, ID band Patient awake    Reviewed: Allergy & Precautions, H&P , NPO status , Patient's Chart, lab work & pertinent test results  Airway Mallampati: II  TM Distance: >3 FB Neck ROM: Full    Dental no notable dental hx. (+) Teeth Intact, Dental Advisory Given, Missing   Pulmonary sleep apnea and Continuous Positive Airway Pressure Ventilation , COPD, Current Smoker and Patient abstained from smoking., former smoker   Pulmonary exam normal breath sounds clear to auscultation       Cardiovascular Exercise Tolerance: Good hypertension, Pt. on medications and Pt. on home beta blockers + CAD, + Past MI, + CABG and +CHF  + dysrhythmias Atrial Fibrillation  Rhythm:Regular Rate:Normal     Neuro/Psych   Anxiety     negative neurological ROS     GI/Hepatic Neg liver ROS,GERD  Medicated,,  Endo/Other  diabetes, Type 2, Oral Hypoglycemic AgentsHypothyroidism    Renal/GU negative Renal ROS  negative genitourinary   Musculoskeletal   Abdominal   Peds  Hematology negative hematology ROS (+)   Anesthesia Other Findings   Reproductive/Obstetrics negative OB ROS                             Anesthesia Physical Anesthesia Plan  ASA: 3  Anesthesia Plan: General   Post-op Pain Management: Minimal or no pain anticipated   Induction: Intravenous and Cricoid pressure planned  PONV Risk Score and Plan: 1 and Propofol infusion and Treatment may vary due to age or medical condition  Airway Management Planned: Oral ETT  Additional Equipment: None  Intra-op Plan:   Post-operative Plan:   Informed Consent: I have reviewed the patients History and Physical, chart, labs and discussed the procedure including the risks, benefits and alternatives for the proposed anesthesia with the patient or authorized representative who has indicated his/her  understanding and acceptance.     Dental advisory given  Plan Discussed with: CRNA and Anesthesiologist  Anesthesia Plan Comments:         Anesthesia Quick Evaluation

## 2022-12-11 NOTE — Op Note (Addendum)
[facility] Patient Name: Nathan Cross Procedure Date: 12/11/2022 MRN: [id number] Attending MD: Corliss Parish , MD, [id number] Date of Birth: July 16, 1950 CSN: [id number] Age: 72 Admit Type: Outpatient Procedure:                Upper GI endoscopy Indications:              Epigastric abdominal pain, Suspected stenosis of                            the duodenum, Exclusion of duodenal stenosis, For                            therapy of duodenal stenosis, Abdominal bloating,                            Nausea with vomiting, Regurgitation Providers:                Corliss Parish, MD, Adolph Pollack, RN,                            Martha Clan, RN, Kandice Robinsons, Technician Referring MD:             Carie Caddy. Pyrtle, MD, Amy L. Stanfield Medicines:                General Anesthesia Complications:            No immediate complications. Estimated Blood Loss:     Estimated blood loss was minimal. Procedure:                Pre-Anesthesia Assessment:                           - Prior to the procedure, a History and Physical                            was performed, and patient medications and                            allergies were reviewed. The patient's tolerance of                            previous anesthesia was also reviewed. The risks                            and benefits of the procedure and the sedation                            options and risks were discussed with the patient.                            All questions were answered, and informed consent                            was obtained. Prior Anticoagulants: The patient has  taken no anticoagulant or antiplatelet agents                            except for aspirin. ASA Grade Assessment: III - A                            patient with severe systemic disease. After                            reviewing the risks and benefits, the patient was                             deemed in satisfactory condition to undergo the                            procedure.                           After obtaining informed consent, the endoscope was                            passed under direct vision. Throughout the                            procedure, the patient's blood pressure, pulse, and                            oxygen saturations were monitored continuously. The                            GIF-H190 ([id number]) Olympus endoscope was introduced                            through the mouth, and advanced to the duodenal                            bulb. The GIF-[id number] ([id number]) Olympus therapeutic                            endoscope was introduced through the mouth, and                            advanced to the duodenal bulb. The upper GI                            endoscopy was accomplished without difficulty. The                            patient tolerated the procedure. Scope In: Scope Out: Findings:      No gross lesions were noted in the proximal esophagus and in the mid       esophagus.      LA Grade B (one or more mucosal breaks greater than 5 mm, not extending       between the tops of two  mucosal folds) esophagitis with no bleeding was       found in the distal esophagus.      The Z-line was irregular and was found 47 cm from the incisors.      A medium amount of food (residue) was found in the entire examined       stomach. Removal was accomplished with a Roth net. Suction via Endoscope       was performed as well.      Patchy mildly erythematous mucosa without bleeding was found in the       entire examined stomach. Biopsies were taken with a cold forceps for       histology and Helicobacter pylori testing.      Food (residue) was found in the duodenal bulb. Removal was accomplished       with a Roth net. Suction via Endoscope was performed as well.      An acquired benign-appearing, intrinsic severe stenosis was found in the       duodenal bulb and was  non-traversed. I could look through this stenotic       area and see what appeared to be otherwise normal appearing tissue       distally. I proceeded, with decision to move forward due to recurrent       nature of this stricture with stenting for a more durable effect. This       was stented with an AXIOS 20 mm x 10 mm LAMS successfully.      What I could see after the AXIOS stent had been placed, was that there       does appear to be another acquired benign-appearing, intrinsic mild       stenosis distally within the duodenal sweep (this was not appreciated       preivously). This is suggestive that further interventions may be needed       not only with the stenosis dealt with today. Impression:               - No gross lesions in the proximal esophagus and in                            the mid esophagus. LA Grade B esophagitis with no                            bleeding found distally.Z-line irregular, 47 cm                            from the incisors.                           - A medium amount of food (residue) in the stomach.                            Removal was successful.                           - Erythematous mucosa in the stomach. Biopsied.                           - Retained food in the duodenum bulb. Removal was  successful.                           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 Inflammatory in appearance. As noted above,                            this area was stented with an AXIOS 20 mm x 10 mm                            to try and give more durable response to dilation.                           - Within the duodenal sweep, there appears to still                            be a stenosis present however which may also                            require additional therapies. Moderate Sedation:      Not Applicable - Patient had care per Anesthesia. Recommendation:           - The patient  will be observed post-procedure,                            until all discharge criteria are met.                           - Discharge patient to home.                           - Patient has a contact number available for                            emergencies. The signs and symptoms of potential                            delayed complications were discussed with the                            patient. Return to normal activities tomorrow.                            Written discharge instructions were provided to the                            patient.                           - Full liquid diet for 2 days.                           - Low-residue/Low-fiber  diet thereafter.                           - Will plan to remove stent in 4-weeks to allow for                            durable dilation.                           - After removal, will need to see about the other                            stenosis within the duodenal sweep to see if that                            will also require dilation +/- stenting.                           - If patient cannot tolerate this stent being in                            place (which can happen at times), then will need                            an EGD with stent pull sooner.                           - May restart blood thinner on 6/19 to decrease                            post-interventional bleeding risk.                           - Continue present medications otherwise.                           - The findings and recommendations were discussed                            with the patient.                           - The findings and recommendations were discussed                            with the patient's family. Procedure Code(s):        --- Professional ---                           [phone], Esophagogastroduodenoscopy, flexible,                            transoral; with placement of endoscopic stent                             (  includes pre- and post-dilation and guide wire                            passage, when performed)                           43247, Esophagogastroduodenoscopy, flexible,                            transoral; with removal of foreign body(s)                           43239, 59, Esophagogastroduodenoscopy, flexible,                            transoral; with biopsy, single or multiple Diagnosis Code(s):        --- Professional ---                           K20.90, Esophagitis, unspecified without bleeding                           K22.89, Other specified disease of esophagus                           T18.2XXA, Foreign body in stomach, initial encounter                           K31.89, Other diseases of stomach and duodenum                           T18.3XXA, Foreign body in small intestine, initial                            encounter                           K31.5, Obstruction of duodenum                           R10.13, Epigastric pain                           R14.0, Abdominal distension (gaseous)                           R11.2, Nausea with vomiting, unspecified                           R11.10, Vomiting, unspecified CPT copyright 2022 American Medical Association. All rights reserved. The codes documented in this report are preliminary and upon coder review may  be revised to meet current compliance requirements. Corliss Parish, MD 12/11/2022 11:25:49 AM Number of Addenda: 0

## 2022-12-11 NOTE — Telephone Encounter (Signed)
-----   Message from Lemar Lofty., MD sent at 12/11/2022  2:27 PM EDT ----- Regarding: Follow-up EGD Nathan Cross, This patient needs an EGD follow-up for AXIOS stent removal in 4 to 6 weeks.  Please schedule next available in that time range.  If it is further out than that range, please let me know and we will have to find that slot somehow for him.  Thanks. GM

## 2022-12-11 NOTE — Anesthesia Procedure Notes (Signed)
Procedure Name: Intubation Date/Time: 12/11/2022 10:27 AM  Performed by: Marny Lowenstein, CRNAPre-anesthesia Checklist: Patient identified, Emergency Drugs available, Suction available and Patient being monitored Patient Re-evaluated:Patient Re-evaluated prior to induction Oxygen Delivery Method: Circle system utilized Preoxygenation: Pre-oxygenation with 100% oxygen Induction Type: IV induction, Rapid sequence and Cricoid Pressure applied Laryngoscope Size: Mac and 4 Grade View: Grade I Tube type: Oral Tube size: 8.0 mm Number of attempts: 1 Airway Equipment and Method: Stylet Placement Confirmation: ETT inserted through vocal cords under direct vision, positive ETCO2 and breath sounds checked- equal and bilateral Secured at: 23 cm Tube secured with: Tape Dental Injury: Teeth and Oropharynx as per pre-operative assessment

## 2022-12-11 NOTE — H&P (Signed)
GASTROENTEROLOGY PROCEDURE H&P NOTE   Primary Care Physician: Vanessa [place], FNP  HPI: Nathan Cross is a 72 y.o. male who presents for EGD for history of duodenal stenosis with symptoms of nausea and vomiting.  Patient has also been recently on GLP agonist (though symptoms predated initiation of this).  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   Dysphagia 04/2020   Dysrhythmia    Paroxysmal atrial fibrillation    ED (erectile dysfunction)    GERD (gastroesophageal reflux disease)    History of eye prosthesis    Left   Hyperlipidemia    controlled on meds    Hypertension    Hypothyroidism    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)   Myocardial infarction (HCC)    PAF (paroxysmal atrial fibrillation) (HCC)    Sleep apnea    wears cpap    Zenker's diverticulum    Past Surgical History:  Procedure Laterality Date   ANTERIOR LAT LUMBAR FUSION N/A 06/10/2019   Procedure: Lumbar one-two , Lumbar two-three, Lumbar three-four- Anterolateral decompression/fusion with percutaneous pedicle screw fixation;  Surgeon: Maeola Harman, MD;  Location: [facility] OR;  Service: Neurosurgery;  Laterality: N/A;   BALLOON DILATION N/A 05/27/2020   Procedure: BALLOON DILATION;  Surgeon: Meridee Score Netty Starring., MD;  Location: [facility] ENDOSCOPY;  Service: Gastroenterology;  Laterality: N/A;   BALLOON DILATION N/A 06/28/2020   Procedure: BALLOON DILATION;  Surgeon: Meridee Score Netty Starring., MD;  Location: [facility] ENDOSCOPY;  Service: Gastroenterology;  Laterality: N/A;   BALLOON DILATION N/A 09/06/2020   Procedure: BALLOON DILATION;  Surgeon:  Meridee Score Netty Starring., MD;  Location: [facility] ENDOSCOPY;  Service: Gastroenterology;  Laterality: N/A;   BIOPSY  06/28/2020   Procedure: BIOPSY;  Surgeon: Meridee Score Netty Starring., MD;  Location: [facility] ENDOSCOPY;  Service: Gastroenterology;;   CARDIAC CATHETERIZATION  11/09/2017   COLONOSCOPY     CORONARY ARTERY BYPASS GRAFT  2004   CABG x 4    ELECTROPHYSIOLOGIC STUDY  05/05/2020   [facility] - ABLATION A-fib pulm vein     ESOPHAGOGASTRODUODENOSCOPY (EGD) WITH PROPOFOL N/A 05/27/2020   Procedure: ESOPHAGOGASTRODUODENOSCOPY (EGD) WITH PROPOFOL;  Surgeon: Lemar Lofty., MD;  Location: [facility] ENDOSCOPY;  Service: Gastroenterology;  Laterality: N/A;   ESOPHAGOGASTRODUODENOSCOPY (EGD) WITH PROPOFOL N/A 06/28/2020   Procedure: ESOPHAGOGASTRODUODENOSCOPY (EGD) WITH PROPOFOL;  Surgeon: Meridee Score Netty Starring., MD;  Location: [facility] ENDOSCOPY;  Service: Gastroenterology;  Laterality: N/A;  Fluoro    ESOPHAGOGASTRODUODENOSCOPY (EGD) WITH PROPOFOL N/A 09/06/2020   Procedure: ESOPHAGOGASTRODUODENOSCOPY (EGD) WITH PROPOFOL;  Surgeon: Meridee Score Netty Starring., MD;  Location: [facility] ENDOSCOPY;  Service: Gastroenterology;  Laterality: N/A;  Fluoro and ultras slim scope needed.    FOREIGN BODY REMOVAL  05/27/2020   Procedure: FOREIGN BODY REMOVAL;  Surgeon: Meridee Score Netty Starring., MD;  Location: [facility] ENDOSCOPY;  Service: Gastroenterology;;   FOREIGN BODY REMOVAL  06/28/2020   Procedure: FOREIGN BODY REMOVAL;  Surgeon: Meridee Score Netty Starring., MD;  Location: [facility] ENDOSCOPY;  Service: Gastroenterology;;   FOREIGN BODY REMOVAL  09/06/2020   Procedure: FOREIGN BODY REMOVAL;  Surgeon: Meridee Score Netty Starring., MD;  Location: [facility] ENDOSCOPY;  Service: Gastroenterology;;   HERNIA REPAIR     KIDNEY DONATION Right 1996   LUMBAR PERCUTANEOUS PEDICLE SCREW 3 LEVEL N/A 06/10/2019   Procedure: LUMBAR PERCUTANEOUS PEDICLE SCREW 3 LEVEL;  Surgeon: Maeola Harman, MD;  Location: [facility] OR;  Service: Neurosurgery;  Laterality: N/A;   MEDIAL PARTIAL KNEE  REPLACEMENT Bilateral    POLYPECTOMY     Prosthetic Eye Left    TONSILLECTOMY     as child    UPPER GASTROINTESTINAL ENDOSCOPY     Current Facility-Administered Medications  Medication Dose Route Frequency Provider Last Rate Last Admin   lactated ringers infusion    Continuous PRN Mansouraty, Netty Starring., MD 10 mL/hr at 12/11/22 0957 1,000 mL at 12/11/22 0957    Current Facility-Administered Medications:    lactated ringers infusion, , , Continuous PRN, Mansouraty, Netty Starring., MD, Last Rate: 10 mL/hr at 12/11/22 0957, 1,000 mL at 12/11/22 0957 No Known Allergies Family History  Problem Relation Age of Onset   Colon cancer Neg Hx    Stomach cancer Neg Hx    Pancreatic cancer Neg Hx    Esophageal cancer Neg Hx    Colon polyps Neg Hx    Rectal cancer Neg Hx    Social History   Socioeconomic History   Marital status: Married    Spouse name: Not on file   Number of children: Not on file   Years of education: Not on file   Highest education level: Not on file  Occupational History   Not on file  Tobacco Use   Smoking status: Some Days    Packs/day: .5    Types: Cigarettes    Last attempt to quit: 02/25/2020    Years since quitting: 2.7   Smokeless tobacco: Never  Vaping Use   Vaping Use: Never used  Substance and Sexual Activity   Alcohol use: Yes    Comment: occassionally   Drug use: Never   Sexual activity: Yes  Other Topics Concern   Not on file  Social History Narrative   Not on file   Social Determinants of Health   Financial Resource Strain: Not on file  Food Insecurity: Not on file  Transportation Needs: Not on file  Physical Activity: Not on file  Stress: Not on file  Social Connections: Not on file  Intimate Partner Violence: Not on file    Physical Exam: Today's Vitals   11/30/22 1436 12/11/22 0949  BP:  103/64  Pulse:  62  Resp:  12  Temp:  97.7 F (36.5 C)  TempSrc:  Tympanic  Weight: 121.6 kg 121.6 kg  Height: 6\' 3"  (1.905 m) 6\' 3"   (1.905 m)  PainSc:  0-No pain   Body mass index is 33.51 kg/m. GEN: NAD EYE: Sclerae anicteric ENT: MMM CV: Non-tachycardic GI: Soft, NT/ND NEURO:  Alert & Oriented x 3  Lab Results: No results for input(s): "WBC", "HGB", "HCT", "PLT" in the last 72 hours. BMET No results for input(s): "NA", "K", "CL", "CO2", "GLUCOSE", "BUN", "CREATININE", "CALCIUM" in the last 72 hours. LFT No results for input(s): "PROT", "ALBUMIN", "AST", "ALT", "ALKPHOS", "BILITOT", "BILIDIR", "IBILI" in the last 72 hours. PT/INR No results for input(s): "LABPROT", "INR" in the last 72 hours.   Impression / Plan: This is a 72 y.o.male who presents for EGD for history of duodenal stenosis with symptoms of nausea and vomiting.  Patient has also been recently on GLP agonist (though symptoms predated initiation  of this).  The risks and benefits of endoscopic evaluation/treatment were discussed with the patient and/or family; these include but are not limited to the risk of perforation, infection, bleeding, missed lesions, lack of diagnosis, severe illness requiring hospitalization, as well as anesthesia and sedation related illnesses.  The patient's history has been reviewed, patient examined, no change in status, and deemed stable for procedure.  The patient and/or family is agreeable to proceed.    Corliss Parish, MD [facility] Advanced Endoscopy Office # [phone]

## 2022-12-11 NOTE — Anesthesia Postprocedure Evaluation (Signed)
Anesthesia Post Note  Patient: [name]  Procedure(s) Performed: ESOPHAGOGASTRODUODENOSCOPY (EGD) WITH PROPOFOL FOREIGN BODY REMOVAL BIOPSY AXIOS STENT PLACEMENT     Patient location during evaluation: PACU Anesthesia Type: General Level of consciousness: awake and alert Pain management: pain level controlled Vital Signs Assessment: post-procedure vital signs reviewed and stable Respiratory status: spontaneous breathing, nonlabored ventilation, respiratory function stable and patient connected to nasal cannula oxygen Cardiovascular status: blood pressure returned to baseline and stable Postop Assessment: no apparent nausea or vomiting Anesthetic complications: no   No notable events documented.  Last Vitals:  Vitals:   12/11/22 1120 12/11/22 1130  BP: 134/62 (!) 133/59  Pulse: 71 66  Resp: 16 16  Temp:    SpO2: 97% 97%    Last Pain:  Vitals:   12/11/22 1130  TempSrc:   PainSc: 0-No pain                 Elbridge Magowan

## 2022-12-11 NOTE — Discharge Instructions (Addendum)
Resume Xarelto on Wednesday.  YOU HAD AN ENDOSCOPIC PROCEDURE TODAY: Refer to the procedure report and other information in the discharge instructions given to you for any specific questions about what was found during the examination. If this information does not answer your questions, please call [place] office at [phone] to clarify.   YOU SHOULD EXPECT: Some feelings of bloating in the abdomen. Passage of more gas than usual. Walking can help get rid of the air that was put into your GI tract during the procedure and reduce the bloating. If you had a lower endoscopy (such as a colonoscopy or flexible sigmoidoscopy) you may notice spotting of blood in your stool or on the toilet paper. Some abdominal soreness may be present for a day or two, also.  DIET: Full liquid diet x 2 days  ACTIVITY: Your care partner should take you home directly after the procedure. You should plan to take it easy, moving slowly for the rest of the day. You can resume normal activity the day after the procedure however YOU SHOULD NOT DRIVE, use power tools, machinery or perform tasks that involve climbing or major physical exertion for 24 hours (because of the sedation medicines used during the test).   SYMPTOMS TO REPORT IMMEDIATELY: A gastroenterologist can be reached at any hour. Please call [phone]  for any of the following symptoms:  Following lower endoscopy (colonoscopy, flexible sigmoidoscopy) Excessive amounts of blood in the stool  Significant tenderness, worsening of abdominal pains  Swelling of the abdomen that is new, acute  Fever of 100 or higher  Following upper endoscopy (EGD, EUS, ERCP, esophageal dilation) Vomiting of blood or coffee ground material  New, significant abdominal pain  New, significant chest pain or pain under the shoulder blades  Painful or persistently difficult swallowing  New shortness of breath  Black, tarry-looking or red, bloody stools  FOLLOW UP:  If any biopsies  were taken you will be contacted by phone or by letter within the next 1-3 weeks. Call [phone]  if you have not heard about the biopsies in 3 weeks.  Please also call with any specific questions about appointments or follow up tests.  Full Liquid Diet A full liquid diet refers to fluids and foods that are liquid, or will become liquid, at room temperature. This diet should only be used for a short period of time to help you recover from illness or surgery. Your health care provider or dietitian will help determine when it is safe to eat regular foods again. What are tips for following this plan? Reading food labels Check food labels of nutrition shakes for the amount of protein. Look for nutrition shakes that have at least 8-10 grams of protein in each serving. Choose drinks, such as milks and juices, that are "fortified" or "enriched." This means that vitamins and minerals have been added. Shopping Buy pre-made nutrition shakes to keep on hand. To vary your choices, buy different flavors of milks and shakes. Meal planning Choose flavors and foods that you enjoy. To make sure you get enough energy and calories from food: Have three full liquid meals each day. Have a liquid snack between each meal. Drink 6-8 oz (177-237 mL) of a nutritional supplement shake with meals or as snacks. Add protein powder, powdered milk, milk, or yogurt to shakes to increase the amount of protein. Drink at least one serving a day of citrus fruit juice or fruit juice that has vitamin C added. General guidelines Before starting the full liquid  diet, check with your health care provider to know what foods you should avoid. These may include full-fat or high-fiber liquids. You may have any liquid or food that becomes a liquid at room temperature. The food is considered a liquid if it can be poured off a spoon at room temperature. Do not drink alcohol unless approved by your health care provider. This diet gives you  most of the nutrients that you need for energy, but you may not get enough of certain vitamins, minerals, and fiber. Make sure to talk to your health care provider or dietitian about: How many calories you need to eat each day. How much fluid you should have each day. Taking a multivitamin or a nutritional supplement. What foods should I eat?  Fruits Fruit juice without pulp. Strained fruit pures (seeds and skins removed). Vegetables Pulp-free tomato or vegetable juice. Vegetables pured in soup. Grains Thin, hot cereal, such as farina. Soft-cooked pasta or rice pured in soup. Meats and other proteins Beef, chicken, and fish broths. Powdered protein supplements. Dairy Milk and milk-based beverages, including milk shakes and instant breakfast mixes. Smooth yogurt. Pured cottage cheese. Fats and oils Melted margarine and butter. Cream. Canola, almond, avocado, corn, grapeseed, sunflower, and sesame oils. Gravy. Beverages Water. Coffee and tea (caffeinated or decaffeinated). Cocoa. Liquid nutritional supplements. Soft drinks. Nondairy milks, such as almond, coconut, rice, or soy milk. Sweets and desserts Custard. Pudding. Flavored gelatin. Smooth ice cream (without nuts or candy pieces). Sherbet. Frozen ice pops. [place] ice. Pudding pops. Seasonings and condiments Salt and pepper. Spices. Vinegar. Ketchup. Yellow mustard. Smooth sauces, such as [place], cheese sauce, or white sauce. Soy sauce. Syrup. Honey. Jelly (without fruit pieces). Other foods Cocoa powder. Cream soups. Strained soups. The items listed above may not be a complete list of foods and beverages you can eat. Contact a dietitian for more information. What foods should I avoid? Fruits All whole fresh, frozen, or canned fruits. Vegetables All whole fresh, frozen, or canned vegetables. Grains Whole grains. Pasta. Rice. Cold cereal. Bread. Crackers. Meats and other proteins All cuts of meat, poultry, and fish.  Eggs. Tofu and soy protein. Nuts and nut butters. Precooked or cured meat, such as sausages or meat loaves. Dairy Hard cheese. Yogurt with fruit chunks. Fats and oils Coconut oil. Palm oil. Lard. Cold butter. Sweets and desserts Ice cream or other frozen desserts that contain solids, such as nuts, chocolate chips, and pieces of cookies. Cakes. Cookies. Candy. Seasonings and condiments Stone-ground mustard. Other foods Soups with chunks or pieces. The items listed above may not be a complete list of foods and beverages you should avoid. Contact a dietitian for more information. Summary A full liquid diet refers to fluids and foods that are liquid or will become liquid at room temperature. This diet should only be used for a short period of time to help you recover from illness or surgery. Ask your health care provider or dietitian when it is safe for you to eat regular foods. To make sure you get enough calories and nutrients, eat three meals each day with snacks in between. Drink pre-made nutritional supplement shakes or add protein powder to homemade shakes. Talk to your health care provider about taking a vitamin and mineral supplement. This information is not intended to replace advice given to you by your health care provider. Make sure you discuss any questions you have with your health care provider. Document Revised: 03/30/2020 Document Reviewed: 03/30/2020 Elsevier Patient Education  2024 [employer].

## 2022-12-12 ENCOUNTER — Other Ambulatory Visit: Payer: Self-pay

## 2022-12-12 ENCOUNTER — Encounter: Payer: Self-pay | Admitting: Gastroenterology

## 2022-12-12 DIAGNOSIS — K315 Obstruction of duodenum: Secondary | ICD-10-CM

## 2022-12-12 DIAGNOSIS — Z4689 Encounter for fitting and adjustment of other specified devices: Secondary | ICD-10-CM

## 2022-12-12 LAB — SURGICAL PATHOLOGY

## 2022-12-12 NOTE — Telephone Encounter (Signed)
EGD has been set up for 01/02/23 at [facility] with GM at 1130 am   Left message on machine to call back  On mounjaro and xarelto

## 2022-12-13 ENCOUNTER — Encounter (HOSPITAL_COMMUNITY): Payer: Self-pay | Admitting: Gastroenterology

## 2022-12-13 NOTE — Telephone Encounter (Signed)
EGD scheduled, pt instructed and medications reviewed.  Patient instructions mailed to home.  Patient to call with any questions or concerns.  

## 2022-12-21 ENCOUNTER — Encounter (HOSPITAL_COMMUNITY): Payer: Self-pay | Admitting: Gastroenterology

## 2022-12-29 ENCOUNTER — Telehealth: Payer: Self-pay | Admitting: Gastroenterology

## 2022-12-29 ENCOUNTER — Other Ambulatory Visit: Payer: Self-pay

## 2022-12-29 NOTE — Telephone Encounter (Signed)
Inbound call from patient stating that he is scheduled for an EGD on 7/9 at 11:30 at [facility] with Dr. Meridee Score. Patient stated he wanted to make Dr. Meridee Score aware that last night he had prok chops cut up with a few sides and tea to drink. Patient stated around 7:00 he started bloating and having abdominal pain but had normal stools. Patient stated he went to the bathroom and stuck his fingers down his throat to see if it would allow him to vomit. Patient stated hardly nothing would come up but later on in the night a good amount came up and seemed to have some blood in it. Patient stated the EMS was called and gave him Zorfran and seemed to help. Patient is requesting a call to be advised if Dr. Meridee Score has any recommendations for him or if he can get his procedure moved up. Please advise.

## 2022-12-29 NOTE — Telephone Encounter (Signed)
Called patient to get a symptom update & said he is doing better today, just very sore from vomiting yesterday. Bloating has improved. Still nauseous. He mentions that he had a 10 lb weight loss over a 16 hour period. Pt is on mounjaro as well, started it 6 weeks ago. 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 Since there is no sooner procedure date, he would like to know if zofran & carafate refills can be called into his [facility] pharmacy in [place], or if Dr. Meridee Score has any other recommendations. He will continue to stay hydrated & stick with a liquid diet in the meantime until he can have his procedure next week, and advised to go to ED over the weekend if symptoms worsen. Pt verbalized all understanding.

## 2022-12-29 NOTE — Telephone Encounter (Signed)
MD made aware via secure chat as well.

## 2022-12-30 NOTE — Telephone Encounter (Signed)
Please have patient come in for a KUB 2-view to see what the stent looks like. If he is doing OK, then will plan to see where things stand at his followup procedure. Thanks. GM

## 2023-01-01 ENCOUNTER — Other Ambulatory Visit: Payer: Self-pay

## 2023-01-01 DIAGNOSIS — K315 Obstruction of duodenum: Secondary | ICD-10-CM

## 2023-01-01 DIAGNOSIS — Z9889 Other specified postprocedural states: Secondary | ICD-10-CM

## 2023-01-01 NOTE — Telephone Encounter (Signed)
Left message for pt to call back  °

## 2023-01-01 NOTE — Anesthesia Preprocedure Evaluation (Signed)
Anesthesia Evaluation  Patient identified by MRN, date of birth, ID band Patient awake    Reviewed: Allergy & Precautions, NPO status , Patient's Chart, lab work & pertinent test results  History of Anesthesia Complications Negative for: history of anesthetic complications  Airway Mallampati: II  TM Distance: >3 FB Neck ROM: Full   Comment: Previous grade I view with MAC 4 Dental  (+) Dental Advisory Given, Caps   Pulmonary neg shortness of breath, sleep apnea and Continuous Positive Airway Pressure Ventilation , COPD, neg recent URI, Current Smoker   Pulmonary exam normal breath sounds clear to auscultation       Cardiovascular hypertension (lisinopril, metoprolol), Pt. on home beta blockers and Pt. on medications (-) angina + CAD, + Past MI, + Cardiac Stents, + CABG (2004) and +CHF  + dysrhythmias (s/p ablation, on amiodarone) Atrial Fibrillation  Rhythm:Regular Rate:Normal  HLD   Neuro/Psych  PSYCHIATRIC DISORDERS Anxiety     negative neurological ROS     GI/Hepatic Neg liver ROS,GERD  Medicated,,Duodenal stricture, Zenker's diverticulum, diverticulosis   Endo/Other  diabetes, Type 2, Oral Hypoglycemic AgentsHypothyroidism    Renal/GU Renal disease (kidney donor)     Musculoskeletal  (+) Arthritis ,    Abdominal  (+) + obese  Peds  Hematology negative hematology ROS (+)   Anesthesia Other Findings Last Xarelto: 7/4 Last Mounjaro: 7/1  Reproductive/Obstetrics                             Anesthesia Physical Anesthesia Plan  ASA: 3  Anesthesia Plan: General   Post-op Pain Management:    Induction: Intravenous and Rapid sequence  PONV Risk Score and Plan: 1 and Ondansetron, Dexamethasone and Treatment may vary due to age or medical condition  Airway Management Planned: Oral ETT  Additional Equipment:   Intra-op Plan:   Post-operative Plan: Extubation in OR  Informed  Consent: I have reviewed the patients History and Physical, chart, labs and discussed the procedure including the risks, benefits and alternatives for the proposed anesthesia with the patient or authorized representative who has indicated his/her understanding and acceptance.     Dental advisory given  Plan Discussed with: Anesthesiologist and CRNA  Anesthesia Plan Comments: (Risks of general anesthesia discussed including, but not limited to, sore throat, hoarse voice, chipped/damaged teeth, injury to vocal cords, nausea and vomiting, allergic reactions, lung infection, heart attack, stroke, and death. All questions answered. )       Anesthesia Quick Evaluation

## 2023-01-02 ENCOUNTER — Ambulatory Visit (HOSPITAL_BASED_OUTPATIENT_CLINIC_OR_DEPARTMENT_OTHER): Payer: Medicare Other | Admitting: Anesthesiology

## 2023-01-02 ENCOUNTER — Ambulatory Visit (HOSPITAL_COMMUNITY)
Admission: RE | Admit: 2023-01-02 | Discharge: 2023-01-02 | Disposition: A | Discharge status: Home / Self Care | Payer: Medicare Other | Source: Ambulatory Visit | Attending: Gastroenterology | Admitting: Gastroenterology

## 2023-01-02 ENCOUNTER — Other Ambulatory Visit: Payer: Self-pay

## 2023-01-02 ENCOUNTER — Ambulatory Visit (HOSPITAL_COMMUNITY): Payer: Medicare Other | Admitting: Anesthesiology

## 2023-01-02 ENCOUNTER — Ambulatory Visit (HOSPITAL_COMMUNITY)
Admission: RE | Admit: 2023-01-02 | Discharge: 2023-01-02 | Disposition: A | Discharge status: Home / Self Care | Payer: Medicare Other | Attending: Gastroenterology | Admitting: Gastroenterology

## 2023-01-02 ENCOUNTER — Encounter (HOSPITAL_COMMUNITY): Payer: Self-pay | Admitting: Gastroenterology

## 2023-01-02 ENCOUNTER — Encounter (HOSPITAL_COMMUNITY)
Admission: RE | Disposition: A | Discharge status: Home / Self Care | Payer: Self-pay | Source: Home / Self Care | Attending: Gastroenterology

## 2023-01-02 DIAGNOSIS — Z9889 Other specified postprocedural states: Secondary | ICD-10-CM | POA: Insufficient documentation

## 2023-01-02 DIAGNOSIS — I110 Hypertensive heart disease with heart failure: Secondary | ICD-10-CM | POA: Insufficient documentation

## 2023-01-02 DIAGNOSIS — Z951 Presence of aortocoronary bypass graft: Secondary | ICD-10-CM | POA: Insufficient documentation

## 2023-01-02 DIAGNOSIS — I509 Heart failure, unspecified: Secondary | ICD-10-CM

## 2023-01-02 DIAGNOSIS — Z905 Acquired absence of kidney: Secondary | ICD-10-CM | POA: Insufficient documentation

## 2023-01-02 DIAGNOSIS — I480 Paroxysmal atrial fibrillation: Secondary | ICD-10-CM | POA: Diagnosis not present

## 2023-01-02 DIAGNOSIS — J449 Chronic obstructive pulmonary disease, unspecified: Secondary | ICD-10-CM | POA: Diagnosis not present

## 2023-01-02 DIAGNOSIS — K315 Obstruction of duodenum: Secondary | ICD-10-CM | POA: Insufficient documentation

## 2023-01-02 DIAGNOSIS — N289 Disorder of kidney and ureter, unspecified: Secondary | ICD-10-CM | POA: Insufficient documentation

## 2023-01-02 DIAGNOSIS — E119 Type 2 diabetes mellitus without complications: Secondary | ICD-10-CM | POA: Diagnosis not present

## 2023-01-02 DIAGNOSIS — Z4659 Encounter for fitting and adjustment of other gastrointestinal appliance and device: Secondary | ICD-10-CM

## 2023-01-02 DIAGNOSIS — G4730 Sleep apnea, unspecified: Secondary | ICD-10-CM | POA: Diagnosis not present

## 2023-01-02 DIAGNOSIS — I252 Old myocardial infarction: Secondary | ICD-10-CM | POA: Diagnosis not present

## 2023-01-02 DIAGNOSIS — K920 Hematemesis: Secondary | ICD-10-CM | POA: Diagnosis present

## 2023-01-02 DIAGNOSIS — F17210 Nicotine dependence, cigarettes, uncomplicated: Secondary | ICD-10-CM | POA: Diagnosis not present

## 2023-01-02 DIAGNOSIS — I2510 Atherosclerotic heart disease of native coronary artery without angina pectoris: Secondary | ICD-10-CM | POA: Diagnosis not present

## 2023-01-02 DIAGNOSIS — Z4689 Encounter for fitting and adjustment of other specified devices: Secondary | ICD-10-CM

## 2023-01-02 HISTORY — PX: STENT REMOVAL: SHX6421

## 2023-01-02 HISTORY — PX: ESOPHAGOGASTRODUODENOSCOPY (EGD) WITH PROPOFOL: SHX5813

## 2023-01-02 HISTORY — PX: DUODENAL STENT PLACEMENT: SHX5541

## 2023-01-02 LAB — GLUCOSE, CAPILLARY: Glucose-Capillary: 112 mg/dL — ABNORMAL HIGH (ref 70–99)

## 2023-01-02 SURGERY — ESOPHAGOGASTRODUODENOSCOPY (EGD) WITH PROPOFOL
Anesthesia: General

## 2023-01-02 MED ORDER — PROPOFOL 10 MG/ML IV BOLUS
INTRAVENOUS | Status: AC
  Filled 2023-01-02: qty 20

## 2023-01-02 MED ORDER — SUGAMMADEX SODIUM 200 MG/2ML IV SOLN
INTRAVENOUS | Status: DC | PRN
  Administered 2023-01-02: 230 mg via INTRAVENOUS

## 2023-01-02 MED ORDER — SODIUM CHLORIDE 0.9 % IV SOLN: INTRAVENOUS | Status: DC

## 2023-01-02 MED ORDER — LIDOCAINE 2% (20 MG/ML) 5 ML SYRINGE
INTRAMUSCULAR | Status: DC | PRN
  Administered 2023-01-02: 60 mg via INTRAVENOUS

## 2023-01-02 MED ORDER — ROCURONIUM BROMIDE 10 MG/ML (PF) SYRINGE
PREFILLED_SYRINGE | INTRAVENOUS | Status: DC | PRN
  Administered 2023-01-02: 50 mg via INTRAVENOUS

## 2023-01-02 MED ORDER — ONDANSETRON 8 MG PO TBDP: 8.0000 mg | ORAL_TABLET | Freq: Three times a day (TID) | ORAL | 1 refills | Status: DC | PRN

## 2023-01-02 MED ORDER — SUCRALFATE 1 G PO TABS: 1.0000 g | ORAL_TABLET | Freq: Two times a day (BID) | ORAL | 11 refills | Status: DC

## 2023-01-02 MED ORDER — DEXAMETHASONE SODIUM PHOSPHATE 10 MG/ML IJ SOLN
INTRAMUSCULAR | Status: DC | PRN
  Administered 2023-01-02: 4 mg via INTRAVENOUS

## 2023-01-02 MED ORDER — LACTATED RINGERS IV SOLN: INTRAVENOUS | Status: DC

## 2023-01-02 MED ORDER — ONDANSETRON HCL 4 MG/2ML IJ SOLN
INTRAMUSCULAR | Status: DC | PRN
  Administered 2023-01-02: 4 mg via INTRAVENOUS

## 2023-01-02 MED ORDER — PROPOFOL 10 MG/ML IV BOLUS
INTRAVENOUS | Status: DC | PRN
  Administered 2023-01-02: 130 mg via INTRAVENOUS

## 2023-01-02 MED ORDER — PHENYLEPHRINE 80 MCG/ML (10ML) SYRINGE FOR IV PUSH (FOR BLOOD PRESSURE SUPPORT)
PREFILLED_SYRINGE | INTRAVENOUS | Status: DC | PRN
  Administered 2023-01-02: 160 ug via INTRAVENOUS

## 2023-01-02 MED ORDER — FENTANYL CITRATE (PF) 100 MCG/2ML IJ SOLN
INTRAMUSCULAR | Status: DC | PRN
  Administered 2023-01-02: 25 ug via INTRAVENOUS

## 2023-01-02 MED ORDER — XARELTO 20 MG PO TABS: 20.0000 mg | ORAL_TABLET | Freq: Every day | ORAL | 0 refills | Status: DC

## 2023-01-02 MED ORDER — FENTANYL CITRATE (PF) 100 MCG/2ML IJ SOLN
INTRAMUSCULAR | Status: AC
  Filled 2023-01-02: qty 2

## 2023-01-02 SURGICAL SUPPLY — 15 items

## 2023-01-02 NOTE — Discharge Instructions (Signed)
YOU HAD AN ENDOSCOPIC PROCEDURE TODAY: Refer to the procedure report and other information in the discharge instructions given to you for any specific questions about what was found during the examination. If this information does not answer your questions, please call [place] office at [phone] to clarify.  ° °YOU SHOULD EXPECT: Some feelings of bloating in the abdomen. Passage of more gas than usual. Walking can help get rid of the air that was put into your GI tract during the procedure and reduce the bloating.  ° °DIET: Your first meal following the procedure should be a light meal and then it is ok to progress to your normal diet. A half-sandwich or bowl of soup is an example of a good first meal. Heavy or fried foods are harder to digest and may make you feel nauseous or bloated. Drink plenty of fluids but you should avoid alcoholic beverages for 24 hours. ° °ACTIVITY: Your care partner should take you home directly after the procedure. You should plan to take it easy, moving slowly for the rest of the day. You can resume normal activity the day after the procedure however YOU SHOULD NOT DRIVE, use power tools, machinery or perform tasks that involve climbing or major physical exertion for 24 hours (because of the sedation medicines used during the test).  ° °SYMPTOMS TO REPORT IMMEDIATELY: °A gastroenterologist can be reached at any hour. Please call [phone]  for any of the following symptoms:  ° °Following upper endoscopy (EGD, EUS, ERCP, esophageal dilation) °Vomiting of blood or coffee ground material  °New, significant abdominal pain  °New, significant chest pain or pain under the shoulder blades  °Painful or persistently difficult swallowing  °New shortness of breath  °Black, tarry-looking or red, bloody stools ° °FOLLOW UP:  °If any biopsies were taken you will be contacted by phone or by letter within the next 1-3 weeks. Call [phone]  if you have not heard about the biopsies in 3 weeks.    °Please also call with any specific questions about appointments or follow up tests. ° °

## 2023-01-02 NOTE — H&P (Signed)
GASTROENTEROLOGY PROCEDURE H&P NOTE   Primary Care Physician: Vanessa [place], FNP  HPI: Nathan Cross is a 72 y.o. male who presents for EGD for stent pull in setting of duodenal stenosis, recent CGE/Hematemesis last week after some dietary indiscretions.  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   Dysphagia 04/2020   Dysrhythmia    Paroxysmal atrial fibrillation    ED (erectile dysfunction)    GERD (gastroesophageal reflux disease)    History of eye prosthesis    Left   Hyperlipidemia    controlled on meds    Hypertension    Hypothyroidism    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)   Myocardial infarction (HCC)    PAF (paroxysmal atrial fibrillation) (HCC)    Sleep apnea    wears cpap    Zenker's diverticulum    Past Surgical History:  Procedure Laterality Date   ANTERIOR LAT LUMBAR FUSION N/A 06/10/2019   Procedure: Lumbar one-two , Lumbar two-three, Lumbar three-four- Anterolateral decompression/fusion with percutaneous pedicle screw fixation;  Surgeon: Maeola Harman, MD;  Location: [facility] OR;  Service: Neurosurgery;  Laterality: N/A;   BALLOON DILATION N/A 05/27/2020   Procedure: BALLOON DILATION;  Surgeon: Meridee Score Netty Starring., MD;  Location: [facility] ENDOSCOPY;  Service: Gastroenterology;  Laterality: N/A;   BALLOON DILATION N/A 06/28/2020   Procedure: BALLOON DILATION;  Surgeon: Meridee Score Netty Starring., MD;  Location: [facility] ENDOSCOPY;  Service: Gastroenterology;  Laterality: N/A;   BALLOON DILATION N/A 09/06/2020   Procedure: BALLOON DILATION;  Surgeon: Meridee Score Netty Starring., MD;  Location: [facility] ENDOSCOPY;   Service: Gastroenterology;  Laterality: N/A;   BIOPSY  06/28/2020   Procedure: BIOPSY;  Surgeon: Meridee Score Netty Starring., MD;  Location: [facility] ENDOSCOPY;  Service: Gastroenterology;;   BIOPSY  12/11/2022   Procedure: BIOPSY;  Surgeon: Lemar Lofty., MD;  Location: [facility] ENDOSCOPY;  Service: Gastroenterology;;   CARDIAC CATHETERIZATION  11/09/2017   COLONOSCOPY     CORONARY ARTERY BYPASS GRAFT  2004   CABG x 4    ELECTROPHYSIOLOGIC STUDY  05/05/2020   [facility] - ABLATION A-fib pulm vein     ESOPHAGOGASTRODUODENOSCOPY (EGD) WITH PROPOFOL N/A 05/27/2020   Procedure: ESOPHAGOGASTRODUODENOSCOPY (EGD) WITH PROPOFOL;  Surgeon: Lemar Lofty., MD;  Location: [facility] ENDOSCOPY;  Service: Gastroenterology;  Laterality: N/A;   ESOPHAGOGASTRODUODENOSCOPY (EGD) WITH PROPOFOL N/A 06/28/2020   Procedure: ESOPHAGOGASTRODUODENOSCOPY (EGD) WITH PROPOFOL;  Surgeon: Meridee Score Netty Starring., MD;  Location: [facility] ENDOSCOPY;  Service: Gastroenterology;  Laterality: N/A;  Fluoro    ESOPHAGOGASTRODUODENOSCOPY (EGD) WITH PROPOFOL N/A 09/06/2020   Procedure: ESOPHAGOGASTRODUODENOSCOPY (EGD) WITH PROPOFOL;  Surgeon: Meridee Score Netty Starring., MD;  Location: [facility] ENDOSCOPY;  Service: Gastroenterology;  Laterality: N/A;  Fluoro and ultras slim scope needed.    ESOPHAGOGASTRODUODENOSCOPY (EGD) WITH PROPOFOL N/A 12/11/2022   Procedure: ESOPHAGOGASTRODUODENOSCOPY (EGD) WITH PROPOFOL;  Surgeon: Meridee Score Netty Starring., MD;  Location: [facility] ENDOSCOPY;  Service: Gastroenterology;  Laterality: N/A;   FOREIGN BODY REMOVAL  05/27/2020   Procedure: FOREIGN BODY REMOVAL;  Surgeon: Meridee Score Netty Starring., MD;  Location: [facility] ENDOSCOPY;  Service: Gastroenterology;;   FOREIGN BODY REMOVAL  06/28/2020   Procedure: FOREIGN BODY REMOVAL;  Surgeon: Meridee Score Netty Starring., MD;  Location: [facility] ENDOSCOPY;  Service: Gastroenterology;;   FOREIGN BODY REMOVAL  09/06/2020   Procedure: FOREIGN BODY REMOVAL;  Surgeon: Lemar Lofty., MD;  Location: [facility]  ENDOSCOPY;  Service: Gastroenterology;;   FOREIGN BODY REMOVAL  12/11/2022   Procedure: FOREIGN BODY REMOVAL;  Surgeon: Lemar Lofty., MD;  Location: [facility] ENDOSCOPY;  Service: Gastroenterology;;   HERNIA REPAIR     KIDNEY DONATION Right 1996   LUMBAR PERCUTANEOUS PEDICLE SCREW 3 LEVEL N/A 06/10/2019   Procedure: LUMBAR PERCUTANEOUS PEDICLE SCREW 3 LEVEL;  Surgeon: Maeola Harman, MD;  Location[facility] OR;  Service: Neurosurgery;  Laterality: N/A;   MEDIAL PARTIAL KNEE REPLACEMENT Bilateral    POLYPECTOMY     Prosthetic Eye Left    STENT REMOVAL  12/11/2022   Procedure: AXIOS STENT PLACEMENT;  Surgeon: Lemar Lofty., MD;  Location: [facility] ENDOSCOPY;  Service: Gastroenterology;;   TONSILLECTOMY     as child    UPPER GASTROINTESTINAL ENDOSCOPY     Current Facility-Administered Medications  Medication Dose Route Frequency Provider Last Rate Last Admin   0.9 %  sodium chloride infusion   Intravenous Continuous Mansouraty, Netty Starring., MD       lactated ringers infusion   Intravenous Continuous Mansouraty, Netty Starring., MD 10 mL/hr at 01/02/23 1014 New Bag at 01/02/23 1014    Current Facility-Administered Medications:    0.9 %  sodium chloride infusion, , Intravenous, Continuous, Mansouraty, Netty Starring., MD   lactated ringers infusion, , Intravenous, Continuous, Mansouraty, Netty Starring., MD, Last Rate: 10 mL/hr at 01/02/23 1014, New Bag at 01/02/23 1014 No Known Allergies Family History  Problem Relation Age of Onset   Colon cancer Neg Hx    Stomach cancer Neg Hx    Pancreatic cancer Neg Hx    Esophageal cancer Neg Hx    Colon polyps Neg Hx    Rectal cancer Neg Hx    Social History   Socioeconomic History   Marital status: Married    Spouse name: Not on file   Number of children: Not on file   Years of education: Not on file   Highest education level: Not on file  Occupational History   Not on file  Tobacco Use   Smoking status: Some Days    Packs/day: .5    Types:  Cigarettes    Last attempt to quit: 02/25/2020    Years since quitting: 2.8   Smokeless tobacco: Never  Vaping Use   Vaping Use: Never used  Substance and Sexual Activity   Alcohol use: Yes    Comment: occassionally   Drug use: Never   Sexual activity: Yes  Other Topics Concern   Not on file  Social History Narrative   Not on file   Social Determinants of Health   Financial Resource Strain: Not on file  Food Insecurity: Not on file  Transportation Needs: Not on file  Physical Activity: Not on file  Stress: Not on file  Social Connections: Not on file  Intimate Partner Violence: Not on file    Physical Exam: Today's Vitals   01/02/23 1000  BP: (!) 102/57  Pulse: 62  Resp: 14  Temp: 97.6 F (36.4 C)  TempSrc: Temporal  SpO2: 97%  Weight: 114.8 kg  Height: 6\' 3"  (1.905 m)  PainSc: 0-No pain   Body mass index is 31.62 kg/m. GEN: NAD EYE: Sclerae anicteric  ENT: MMM CV: Non-tachycardic GI: Soft, NT/ND NEURO:  Alert & Oriented x 3  Lab Results: No results for input(s): "WBC", "HGB", "HCT", "PLT" in the last 72 hours. BMET No results for input(s): "NA", "K", "CL", "CO2", "GLUCOSE", "BUN", "CREATININE", "CALCIUM" in the last 72 hours. LFT No results for input(s): "PROT", "ALBUMIN", "AST", "ALT", "ALKPHOS", "BILITOT", "BILIDIR", "IBILI" in the last 72 hours. PT/INR No results for input(s): "LABPROT", "INR" in the last 72 hours.   Impression / Plan: This is a 72 y.o.male who presents for EGD for stent pull in setting of duodenal stenosis, recent CGE/Hematemesis last week after some dietary indiscretions.  The risks and benefits of endoscopic evaluation/treatment were discussed with the patient and/or family; these include but are not limited to the risk of perforation, infection, bleeding, missed lesions, lack of diagnosis, severe illness requiring hospitalization, as well as anesthesia and sedation related illnesses.  The patient's history has been reviewed, patient  examined, no change in status, and deemed stable for procedure.  The patient and/or family is agreeable to proceed.    Corliss Parish, MD [facility] Office # [phone]

## 2023-01-02 NOTE — Anesthesia Procedure Notes (Signed)
Procedure Name: Intubation Date/Time: 01/02/2023 10:56 AM  Performed by: Nelle Don, CRNAPre-anesthesia Checklist: Patient identified, Emergency Drugs available, Suction available and Patient being monitored Patient Re-evaluated:Patient Re-evaluated prior to induction Oxygen Delivery Method: Circle system utilized Preoxygenation: Pre-oxygenation with 100% oxygen Induction Type: IV induction Ventilation: Mask ventilation without difficulty Laryngoscope Size: Mac and 4 Grade View: Grade I Tube type: Oral Tube size: 7.5 mm Number of attempts: 1 Airway Equipment and Method: Stylet Placement Confirmation: ETT inserted through vocal cords under direct vision, positive ETCO2 and breath sounds checked- equal and bilateral Secured at: 23 cm Tube secured with: Tape Dental Injury: Teeth and Oropharynx as per pre-operative assessment

## 2023-01-02 NOTE — Transfer of Care (Signed)
Immediate Anesthesia Transfer of Care Note  Patient: Nathan Cross  Procedure(s) Performed: ESOPHAGOGASTRODUODENOSCOPY (EGD) WITH PROPOFOL STENT REMOVAL DUODENAL STENT PLACEMENT  Patient Location: PACU  Anesthesia Type:General  Level of Consciousness: awake, alert , and oriented  Airway & Oxygen Therapy: Patient Spontanous Breathing and Patient connected to face mask oxygen  Post-op Assessment: Report given to RN, Post -op Vital signs reviewed and stable, and Patient moving all extremities X 4  Post vital signs: Reviewed and stable  Last Vitals:  Vitals Value Taken Time  BP 97/47   Temp    Pulse 68 01/02/23 1126  Resp 23 01/02/23 1126  SpO2 96 % 01/02/23 1126  Vitals shown include unvalidated device data.  Last Pain:  Vitals:   01/02/23 1000  TempSrc: Temporal  PainSc: 0-No pain         Complications: No notable events documented.

## 2023-01-02 NOTE — Anesthesia Postprocedure Evaluation (Signed)
Anesthesia Post Note  Patient: Song Garris  Procedure(s) Performed: ESOPHAGOGASTRODUODENOSCOPY (EGD) WITH PROPOFOL STENT REMOVAL DUODENAL STENT PLACEMENT     Patient location during evaluation: PACU Anesthesia Type: General Level of consciousness: awake Pain management: pain level controlled Vital Signs Assessment: post-procedure vital signs reviewed and stable Respiratory status: spontaneous breathing, nonlabored ventilation and respiratory function stable Cardiovascular status: blood pressure returned to baseline and stable Postop Assessment: no apparent nausea or vomiting Anesthetic complications: no   No notable events documented.  Last Vitals:  Vitals:   01/02/23 1138 01/02/23 1150  BP: (!) 97/54 (!) 95/48  Pulse: 62 (!) 57  Resp: 19 17  Temp:    SpO2: 97% 97%    Last Pain:  Vitals:   01/02/23 1150  TempSrc:   PainSc: 0-No pain                 Linton Rump

## 2023-01-02 NOTE — Op Note (Signed)
[facility] Patient Name: Nathan Cross Procedure Date: 01/02/2023 MRN: [id number] Attending MD: Corliss Parish , MD, [id number] Date of Birth: 01/23/1951 CSN: [id number] Age: 72 Admit Type: Outpatient Procedure:                Upper GI endoscopy Indications:              Coffee-ground emesis, Hematemesis, Stenosis of the                            duodenum, Follow-up of duodenal stenosis, Stent                            removal Providers:                Corliss Parish, MD, Fransisca Connors, Eliberto Ivory, RN, Marja Kays, Technician Referring MD:             Carie Caddy. Pyrtle, MD Medicines:                General Anesthesia Complications:            No immediate complications. Estimated Blood Loss:     Estimated blood loss was minimal. Procedure:                Pre-Anesthesia Assessment:                           - Prior to the procedure, a History and Physical                            was performed, and patient medications and                            allergies were reviewed. The patient's tolerance of                            previous anesthesia was also reviewed. The risks                            and benefits of the procedure and the sedation                            options and risks were discussed with the patient.                            All questions were answered, and informed consent                            was obtained. Prior Anticoagulants: The patient has                            taken Xarelto (rivaroxaban), last dose was 3 days  prior to procedure. ASA Grade Assessment: III - A                            patient with severe systemic disease. After                            reviewing the risks and benefits, the patient was                            deemed in satisfactory condition to undergo the                            procedure.                           After obtaining  informed consent, the endoscope was                            passed under direct vision. Throughout the                            procedure, the patient's blood pressure, pulse, and                            oxygen saturations were monitored continuously. The                            [id number] ([id number]) Olympus therapeutic endoscope                            was introduced through the mouth, and advanced to                            the second part of duodenum. The upper GI endoscopy                            was accomplished without difficulty. The patient                            tolerated the procedure. Scope In: Scope Out: Findings:      No gross lesions were noted in the entire esophagus.      No gross lesions were noted in the entire examined stomach.      A previously placed AXIOS LAMS was seen in the apex of the duodenum.       This was widely patent. Stent removal was accomplished with a Raptor       grasping device. This showed a very nicely opened area of previous       stricturing.      Immediately distal to this area, another acquired benign-appearing,       intrinsic severe stenosis was found in the second portion of the       duodenum. This measured approximately 7 mm. Decision was made as he had       such a good improvement with previous stenting, to stent this area. A 20  mm x 10 mm AXIOS LAMS stent was placed with good success.      Traversal into the second portion was with ease and no other gross       lesions were noted. Impression:               - No gross lesions in the entire esophagus.                           - No gross lesions in the entire stomach.                           - AXIOS LAMS at the duodenal apex. Removed.                            Previous stricture is no longer present.                           - Acquired duodenal stenosis (previously noted)                            distal to the prior stricture was noted 7 mm in                             size. AXIOS LAMS 20 mm was placed. This allowed                            easy traversal into D2.                           - No gross lesions in the second portion of the                            duodenum. Moderate Sedation:      Not Applicable - Patient had care per Anesthesia. Recommendation:           - The patient will be observed post-procedure,                            until all discharge criteria are met.                           - Discharge patient to home.                           - Patient has a contact number available for                            emergencies. The signs and symptoms of potential                            delayed complications were discussed with the                            patient. Return to normal activities tomorrow.  Written discharge instructions were provided to the                            patient.                           - Full liquid diet today. May advance diet tomorrow.                           - Must follow a low residue diet moving forward,                            while stent is in place.                           - Observe patient's clinical course.                           - Continue omeprazole twice daily.                           - Carafate 1 g twice daily.                           - May restart rivaroxaban in 48 hours (7/11) to                            decrease post interventional bleeding risk.                           - Minimize all nonsteroidal medications as able to                            help heal this area (check the [employer]                            medication you told me about to make sure not                            Ibuprofen containing).                           - Continue present medications otherwise.                           - Repeat upper endoscopy in 6 weeks for stent pull.                           - The findings and recommendations  were discussed                            with the patient.                           - The findings and recommendations were discussed  with the patient's family. Procedure Code(s):        --- Professional ---                           [id number], Esophagogastroduodenoscopy, flexible,                            transoral; with placement of endoscopic stent                            (includes pre- and post-dilation and guide wire                            passage, when performed)                           43247, Esophagogastroduodenoscopy, flexible,                            transoral; with removal of foreign body(s) Diagnosis Code(s):        --- Professional ---                           Z46.59, Encounter for fitting and adjustment of                            other gastrointestinal appliance and device                           K31.5, Obstruction of duodenum                           K92.0, Hematemesis CPT copyright 2022 [employer]. All rights reserved. The codes documented in this report are preliminary and upon coder review may  be revised to meet current compliance requirements. Corliss Parish, MD 01/02/2023 11:29:22 AM Number of Addenda: 0

## 2023-01-04 ENCOUNTER — Encounter (HOSPITAL_COMMUNITY): Payer: Self-pay | Admitting: Gastroenterology

## 2023-01-04 NOTE — Telephone Encounter (Signed)
Exam was completed on 01/02/23 & currently pending results.

## 2023-01-08 ENCOUNTER — Telehealth: Payer: Self-pay

## 2023-01-08 ENCOUNTER — Other Ambulatory Visit: Payer: Self-pay

## 2023-01-08 DIAGNOSIS — Z9889 Other specified postprocedural states: Secondary | ICD-10-CM

## 2023-01-08 DIAGNOSIS — K315 Obstruction of duodenum: Secondary | ICD-10-CM

## 2023-01-08 NOTE — Telephone Encounter (Signed)
EGD set up for 9/23 at 145 pm at [facility] with GM

## 2023-01-08 NOTE — Telephone Encounter (Signed)
-----   Message from [facility] sent at 01/02/2023 11:29 AM EDT ----- Regarding: Follow-up EGD Guido Comp or covering RN, This patient needs repeat EGD with AXIOS stent pull in 6 to 8 weeks. Thanks. GM

## 2023-01-09 NOTE — Telephone Encounter (Signed)
EGD scheduled, pt instructed and medications reviewed.  Patient instructions mailed to home.  Patient to call with any questions or concerns.  He has been advised to stop mounjaro 7 days prior and xarelto 2 days prior

## 2023-01-12 NOTE — Telephone Encounter (Signed)
Patient called would like to make sure that the hospital procedure should be scheduled in Sept stated the first time he had the stent placed it was only for a few weeks and this time it seems like to would be a lot longer wants to make sure that is what Dr. Meridee Score recommended.

## 2023-01-12 NOTE — Telephone Encounter (Signed)
The pt has been advised that his procedure is scheduled per Dr Mansouraty's timing.  The pt has been advised of the information and verbalized understanding.

## 2023-02-07 ENCOUNTER — Telehealth: Payer: Self-pay | Admitting: Gastroenterology

## 2023-02-07 DIAGNOSIS — K5900 Constipation, unspecified: Secondary | ICD-10-CM

## 2023-02-07 DIAGNOSIS — R194 Change in bowel habit: Secondary | ICD-10-CM

## 2023-02-07 NOTE — Telephone Encounter (Signed)
Called patient in reference to complaints of diarrhea. Patient states he has taken something for the constipation, has Hemorrhoids that have flared up due to the constipation and diarrheal stools that he presently has 30 minutes after eating. Patient states he has done sitz baths and applied hemorrhoidal cream with no relief x's 1 week. Patient also complains about pain located 3 inches below his navel, although he denies vomiting. Patient is concerned the stent has moved from the lower esophagus, into his lower bowel, states he has never experience anything like this before in his life and is willing to come to the hospital if necessary to have any test done, labs or any stool studies if needed. Informed the patient that I will notify the MD and see what is recommended and will also notify him as soon as possible. Patient agreed.

## 2023-02-07 NOTE — Telephone Encounter (Signed)
PT has severe constipation, sore naval and knot below [employer]. He also has 2 hemorrhoids now because of the constipation and is in a lot of pain. He is requesting to speak to a nurse regarding his symptoms. He wants to know should he go to  [facility] and needs to know this morning so he can drive from [place].

## 2023-02-08 ENCOUNTER — Other Ambulatory Visit: Payer: Self-pay | Admitting: *Deleted

## 2023-02-08 DIAGNOSIS — K5900 Constipation, unspecified: Secondary | ICD-10-CM

## 2023-02-08 NOTE — Telephone Encounter (Signed)
He needs 2 v abd xray and I see he will be seen in clinic tomorrow Check cbc, cmp too

## 2023-02-08 NOTE — Telephone Encounter (Signed)
Called patient to inform of x-ray and labs ordered per Dr. Rhea Belton. Patient is scheduled to be seen tomorrow and is aware. Suggested he has his labs and x-ray done prior to his appt. Patient is in agreement.

## 2023-02-09 ENCOUNTER — Ambulatory Visit (INDEPENDENT_AMBULATORY_CARE_PROVIDER_SITE_OTHER): Payer: Medicare Other | Admitting: Physician Assistant

## 2023-02-09 ENCOUNTER — Telehealth: Payer: Self-pay | Admitting: Physician Assistant

## 2023-02-09 ENCOUNTER — Other Ambulatory Visit: Payer: Medicare Other

## 2023-02-09 ENCOUNTER — Ambulatory Visit (INDEPENDENT_AMBULATORY_CARE_PROVIDER_SITE_OTHER)
Admission: RE | Admit: 2023-02-09 | Discharge: 2023-02-09 | Disposition: A | Discharge status: Home / Self Care | Payer: Medicare Other | Source: Ambulatory Visit | Attending: Internal Medicine | Admitting: Internal Medicine

## 2023-02-09 VITALS — BP 116/68 | Wt 247.0 lb

## 2023-02-09 DIAGNOSIS — K5900 Constipation, unspecified: Secondary | ICD-10-CM | POA: Diagnosis not present

## 2023-02-09 DIAGNOSIS — Z86010 Personal history of colonic polyps: Secondary | ICD-10-CM

## 2023-02-09 DIAGNOSIS — K315 Obstruction of duodenum: Secondary | ICD-10-CM | POA: Diagnosis not present

## 2023-02-09 DIAGNOSIS — A09 Infectious gastroenteritis and colitis, unspecified: Secondary | ICD-10-CM | POA: Diagnosis not present

## 2023-02-09 DIAGNOSIS — R194 Change in bowel habit: Secondary | ICD-10-CM

## 2023-02-09 LAB — CBC WITH DIFFERENTIAL/PLATELET
Basophils Absolute: 0 10*3/uL (ref 0.0–0.1)
Basophils Relative: 0.3 % (ref 0.0–3.0)
Eosinophils Absolute: 0.2 10*3/uL (ref 0.0–0.7)
Eosinophils Relative: 1.7 % (ref 0.0–5.0)
HCT: 42.8 % (ref 39.0–52.0)
Hemoglobin: 14.1 g/dL (ref 13.0–17.0)
Lymphocytes Relative: 12.2 % (ref 12.0–46.0)
Lymphs Abs: 1.1 10*3/uL (ref 0.7–4.0)
MCHC: 33 g/dL (ref 30.0–36.0)
MCV: 92.9 fl (ref 78.0–100.0)
Monocytes Absolute: 1 10*3/uL (ref 0.1–1.0)
Monocytes Relative: 10.5 % (ref 3.0–12.0)
Neutro Abs: 6.8 10*3/uL (ref 1.4–7.7)
Neutrophils Relative %: 75.3 % (ref 43.0–77.0)
Platelets: 241 10*3/uL (ref 150.0–400.0)
RBC: 4.6 Mil/uL (ref 4.22–5.81)
RDW: 15 % (ref 11.5–15.5)
WBC: 9.1 10*3/uL (ref 4.0–10.5)

## 2023-02-09 LAB — COMPREHENSIVE METABOLIC PANEL
ALT: 9 U/L (ref 0–53)
AST: 9 U/L (ref 0–37)
Albumin: 3.9 g/dL (ref 3.5–5.2)
Alkaline Phosphatase: 47 U/L (ref 39–117)
BUN: 18 mg/dL (ref 6–23)
CO2: 26 mEq/L (ref 19–32)
Calcium: 9.1 mg/dL (ref 8.4–10.5)
Chloride: 100 mEq/L (ref 96–112)
Creatinine, Ser: 1.39 mg/dL (ref 0.40–1.50)
GFR: 51.33 mL/min — ABNORMAL LOW (ref >60.00)
Glucose, Bld: 104 mg/dL — ABNORMAL HIGH (ref 70–99)
Potassium: 3.6 mEq/L (ref 3.5–5.1)
Sodium: 133 mEq/L — ABNORMAL LOW (ref 135–145)
Total Bilirubin: 0.6 mg/dL (ref 0.2–1.2)
Total Protein: 6.2 g/dL (ref 6.0–8.3)

## 2023-02-09 MED ORDER — HYDROCORTISONE ACETATE 25 MG RE SUPP: 25.0000 mg | Freq: Two times a day (BID) | RECTAL | 0 refills | Status: DC

## 2023-02-09 MED ORDER — AMBULATORY NON FORMULARY MEDICATION: 1 refills | Status: AC

## 2023-02-09 MED ORDER — DICYCLOMINE HCL 20 MG PO TABS: 20.0000 mg | ORAL_TABLET | Freq: Three times a day (TID) | ORAL | 3 refills | Status: DC | PRN

## 2023-02-09 NOTE — Progress Notes (Signed)
02/09/2023 Nathan Cross [id number] [birth year]  Referring provider: Vanessa Caruthers, FNP Primary GI doctor: Dr. Rhea Belton  ASSESSMENT AND PLAN:  Diarrhea with lower abdominal pain for the last month Patient having small-volume loose stools to be is not eating but worse with eating, no fevers or chills.  Patient has been on Ozempic increased dose 0.5 within the last month. CBC without leukocytosis, no anemia.  Has mild hyponatremia but otherwise unremarkable electrolytes and kidney Appears to be potentially infectious, I do wonder if it is not from the increase in dose of Ozempic, less likely diverticular I do not see where he had this on previous colonoscopy. For now with no fever no leukocytosis we will get Diatherix stool here, patient can take Imodium as needed, which she states was helping, advised to hold off on taking next dose of Ozempic. 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 Repeat KUB with Dr. Lavon Paganini shows duodenal stent in place 12/11/2022 EGD Dr. Meridee Score at [facility] for epigastric pain showed LA grade B esophagitis, food residue in the stomach, erythematous mucosa in the stomach retained food in duodenal bulb, acquired duodenal stenosis at apex of bulb inflammatory in appearance stented 20 mm x 10 mm 01/02/2023 EGD for coffee-ground emesis hematemesis showed normal esophagus, normal stomach previous placed Axios in the apex of the duodenum widely patent stent removed, distal to this area another required benign appearing intrinsic severe stenosis 7 mm stented with another 20 mm x 10 mm Axios stent Scheduled for repeat EGD for removal in  Sept  Patient Care Team: Vanessa Preston, FNP as PCP - General (Family Medicine)  HISTORY OF PRESENT ILLNESS: 72 y.o. 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   08/2019 colonoscopy without adenomatous polyps recall March 2026 secondary to history of colon polyps  09/06/2020 EGD with balloon dilation of duodenal stenosis performed by Dr. Loney Loh of upper GI outlet obstructive symptoms which were belching, abdominal bloating and discomfort. EGD he had an irregular Z-line, striped gastritis without bleeding and a moderate mount of food residue in the duodenal bulb. There was a acquired benign intrinsic moderate stenosis at the apex of the duodenal bulb extending to the duodenal sweep. This was able to be traversed with the upper endoscope and it was then dilated to 15 mm with balloon. There was good treatment effect.  At that time he was complaining of incomplete evacuation symptoms left lower quadrant abdominal discomfort started on fiber and given Cipro Flagyl.  10/19/2020 last office visit with Dr. 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 12/11/2022 EGD Dr. Meridee Score at [facility] for epigastric pain showed LA grade B esophagitis, food residue in the stomach, erythematous mucosa in the stomach retained food in duodenal bulb, acquired duodenal stenosis at apex of bulb inflammatory in appearance stented 20 mm x 10 mm 01/02/2023 EGD for coffee-ground emesis hematemesis showed normal esophagus, normal stomach previous placed Axios in the apex of the duodenum widely patent stent removed,  distal to this area another required benign appearing intrinsic severe stenosis 7 mm stented with  another 20 mm x 10 mm Axios stent Supposed to have recall endoscopy 6 weeks for stent removal. Patient called 2 days ago with severe constipation not below his umbilicus and abdominal discomfort.  Dr. Rhea Belton placed order for two-view abdominal x-ray for today and CBC with c-Met.  He states food is going down without issues, no nausea, vomiting.  However last 3-4 weeks he has had lower AB pain/cramping, sore to touch.  If he eats food he will have explosive diarrhea, if he does not eat he will still have urge to have BM but will be small volume ragged pieces.  He states he has cut back on his food due to worsening diarrhea after eating.  Having BM at least 3-4 x a day.  Has been taking pepto/mylanta/colace.  Due to increase stool he has had hemorrhoids, has had black stool due to pepto.  Has had fecal incontinence.  No fever, chills.  No sick contacts. No ABX.  He started on ozempic May 6th, has lost 30 lbs   He denies blood thinner use.  He denies NSAID use.  He denies ETOH use.   He denies tobacco use.  He denies drug use.    He  reports that he has been smoking cigarettes. He has never used smokeless tobacco. He reports current alcohol use. He reports that he does not use drugs.  RELEVANT LABS AND IMAGING: CBC    Component Value Date/Time   WBC 7.0 06/06/2019 1344   RBC 4.07 (L) 06/06/2019 1344   HGB 13.3 09/06/2020 0933   HCT 39.0 09/06/2020 0933   PLT 210 06/06/2019 1344   MCV 93.6 06/06/2019 1344   MCH 30.7 06/06/2019 1344   MCHC 32.8 06/06/2019 1344   RDW 14.0 06/06/2019 1344   No results for input(s): "HGB" in the last 8760 hours.  CMP     Component Value Date/Time   NA 141 09/06/2020 0933   K 4.0 09/06/2020 0933   CL 108 09/06/2020 0933   CO2 25 06/06/2019 1344   GLUCOSE 99 09/06/2020 0933   BUN 18 09/06/2020 0933   CREATININE 1.10 09/06/2020 0933   CALCIUM 8.8 (L) 06/06/2019 1344   GFRNONAA >60 06/06/2019 1344   GFRAA >60 06/06/2019 1344       No data  to display            Current Medications:   Current Outpatient Medications (Endocrine & Metabolic):    FARXIGA 10 MG TABS tablet, Take 10 mg by mouth daily.   levothyroxine (SYNTHROID) 50 MCG tablet, Take 50 mcg by mouth daily before breakfast.   Semaglutide,0.25 or 0.5MG /DOS, (OZEMPIC, 0.25 OR 0.5 MG/DOSE,) 2 MG/1.5ML SOPN, Inject 0.5 mg every week by subcutaneous route for 120 days.   MOUNJARO 2.5 MG/0.5ML Pen, Inject 2.5 mg into the skin once a week. (Patient not taking: Reported on 02/09/2023)  Current Outpatient Medications (Cardiovascular):    amiodarone (PACERONE) 200 MG tablet, Take 200 mg by mouth daily.   furosemide (LASIX) 20 MG tablet, Take 20 mg by mouth daily.   gemfibrozil (LOPID) 600 MG tablet, Take 600 mg by mouth daily.   icosapent Ethyl (VASCEPA) 1 g capsule, Take 1 g by mouth daily.   lisinopril (ZESTRIL) 40 MG tablet, Take 40 mg by mouth daily.   metoprolol succinate (TOPROL-XL) 50 MG 24 hr tablet, Take 50 mg by mouth 2 (two) times daily. Take with or  immediately following a meal.   nitroGLYCERIN (NITROSTAT) 0.4 MG SL tablet, Place 0.4 mg under the tongue every 5 (five) minutes x 3 doses as needed for chest pain.    rosuvastatin (CRESTOR) 20 MG tablet, Take 20 mg by mouth daily.  Current Outpatient Medications (Respiratory):    albuterol (VENTOLIN HFA) 108 (90 Base) MCG/ACT inhaler, Inhale 2 puffs into the lungs every 6 (six) hours as needed for wheezing or shortness of breath.  Current Outpatient Medications (Analgesics):    acetaminophen (TYLENOL) 650 MG CR tablet, Take 1,300 mg by mouth every morning.   aspirin 81 MG chewable tablet, Chew 81 mg by mouth daily. Swallow whole.  Current Outpatient Medications (Hematological):    ferrous sulfate 325 (65 FE) MG tablet, Take 325 mg by mouth daily with breakfast.   XARELTO 20 MG TABS tablet, Take 1 tablet (20 mg total) by mouth daily.  Current Outpatient Medications (Other):    AMBULATORY NON FORMULARY  MEDICATION, Medication Name: Diltiazem 2%/Lidocaine 2%   Using your index finger apply a small amount of medication inside the anal opening and to the external anal area twice daily x 6 weeks.   clonazePAM (KLONOPIN) 0.5 MG tablet, Take 0.5 mg by mouth 2 (two) times daily.   hydrocortisone (ANUSOL-HC) 25 MG suppository, Place 1 suppository (25 mg total) rectally 2 (two) times daily.   omeprazole (PRILOSEC) 40 MG capsule, Take 1 capsule (40 mg total) by mouth 2 (two) times daily before a meal.   ondansetron (ZOFRAN-ODT) 8 MG disintegrating tablet, Take 1 tablet (8 mg total) by mouth every 8 (eight) hours as needed for nausea or vomiting.   sucralfate (CARAFATE) 1 g tablet, Take 1 tablet (1 g total) by mouth 2 (two) times daily.   vitamin C (ASCORBIC ACID) 500 MG tablet, Take 500 mg by mouth daily.   psyllium (REGULOID) 0.52 g capsule, Take 1.04 g by mouth daily. (Patient not taking: Reported on 02/09/2023)  Medical History:  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   Dysphagia 04/2020   Dysrhythmia    Paroxysmal atrial fibrillation    ED (erectile dysfunction)    GERD (gastroesophageal reflux disease)    History of eye prosthesis    Left   Hyperlipidemia    controlled on meds    Hypertension    Hypothyroidism    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)   Myocardial infarction (HCC)    PAF (paroxysmal atrial fibrillation) (HCC)    Sleep apnea    wears cpap    Zenker's diverticulum    Allergies: No Known Allergies   Surgical History:  He  has a past surgical history that includes Coronary artery bypass graft  (2004); Medial partial knee replacement (Bilateral); Hernia repair; Prosthetic Eye (Left); Anterior lat lumbar fusion (N/A, 06/10/2019); Lumbar percutaneous pedicle screw 3 level (N/A, 06/10/2019); Colonoscopy; Polypectomy; Upper gastrointestinal endoscopy; Tonsillectomy; Kidney donation (Right, 1996); Cardiac catheterization (05/05/2020); Cardiac catheterization (11/09/2017); Esophagogastroduodenoscopy (egd) with propofol (N/A, 05/27/2020); Foreign Body Removal (05/27/2020); Balloon dilation (N/A, 05/27/2020); Esophagogastroduodenoscopy (  egd) with propofol (N/A, 06/28/2020); Foreign Body Removal (06/28/2020); biopsy (06/28/2020); Balloon dilation (N/A, 06/28/2020); Esophagogastroduodenoscopy (egd) with propofol (N/A, 09/06/2020); Foreign Body Removal (09/06/2020); Balloon dilation (N/A, 09/06/2020); Esophagogastroduodenoscopy (egd) with propofol (N/A, 12/11/2022); Foreign Body Removal (12/11/2022); biopsy (12/11/2022); Stent removal (12/11/2022); Esophagogastroduodenoscopy (egd) with propofol (N/A, 01/02/2023); Stent removal (01/02/2023); and duodenal stent placement (N/A, 01/02/2023). Family History:  His family history is not on file.  REVIEW OF SYSTEMS  : All other systems reviewed and negative except where noted in the History of Present Illness.  PHYSICAL EXAM: BP 116/68   Wt 247 lb (112 kg)   BMI 30.87 kg/m  General Appearance: Well nourished, in no apparent distress. Head:   Normocephalic and atraumatic. Eyes:  sclerae anicteric,conjunctive pink  Respiratory: Respiratory effort normal, BS equal bilaterally without rales, rhonchi, wheezing. Cardio: RRR with no MRGs. Peripheral pulses intact.  Abdomen: Soft,  Non-distended obese AB,active bowel sounds. mild tenderness in the LLQ. Without guarding and Without rebound. No masses. Rectal: anterior fissure noted, normal rectal tone, appreciated internal hemorrhoids , no masses, mild tenderness, brown stool, hemoccult Positive Musculoskeletal: Full ROM, Normal gait.  Without edema. Skin:  Dry and intact without significant lesions or rashes Neuro: Alert and  oriented x4;  No focal deficits. Psych:  Cooperative. Normal mood and affect.    Doree Albee, PA-C 2:27 PM

## 2023-02-09 NOTE — Patient Instructions (Addendum)
- Drink a lot of liquids that have water, salt, and sugar. Good choices are water mixed with juice, flavored soda, and soup broth. If you are drinking enough, your urine will be light yellow or almost clear.  - Try to eat a little food. Good choices are potatoes, noodles, rice, oatmeal, crackers, bananas, soup, and boiled vegetables.  - Avoid high fat foods, as they can make diarrhea worse.  - Dairy products (except yogurt) may be difficult to digest when you have diarrhea. I recommend that you temporarily avoid lactose-containing foods.  - Can try loperamide 4 mg initially, then 2 mg after each unformed stool for =2 days, with a maximum of 16 mg/day.  - If loperamide is not working, you could try bismuth salicylate (Pepto-Bismol) 30 mL or two tablets every 30 minutes for eight doses. Pepto-Bismol may make your stools black.   Go to the ER if any severe abdominal pain, fever, or weakness  First do a trial off milk/lactose products if you use them.    FODMAP stands for fermentable oligo-, di-, mono-saccharides and polyols (1). These are the scientific terms used to classify groups of carbs that are difficult for our body to digest and that are notorious for triggering digestive symptoms like bloating, gas, loose stools and stomach pain.   You can try low FODMAP diet  - start with eliminating just one column at a time that you feel may be a trigger for you. - the table at the very bottom contains foods that are low in FODMAPs   Sometimes trying to eliminate the FODMAP's from your diet is difficult or tricky, if you are stuggling with trying to do the elimination diet you can try an enzyme.  There is a food enzymes that you sprinkle in or on your food that helps break down the FODMAP. You can read more about the enzyme by going to this site: https://fodzyme.com/     Anal Fissure, Adult  Diltiazem/lidocaine 3 x daily for 2 months sent to compound pharmacy    Sent this medication to a  compound pharmacy:  [facility] [address], [place], [place] [address]  [phone]   Please DO NOT go directly from our office to pick up this medication! Give the pharmacy 1 day to process the prescription. Extra time is required for them to compound your medication.  Follow up should symptoms persist for secondary evaluation and possible surgical referral for repair.   _______________________________________________________  If your blood pressure at your visit was 140/90 or greater, please contact your primary care physician to follow up on this.  _______________________________________________________  If you are age 7 or older, your body mass index should be between 23-30. Your Body mass index is 30.87 kg/m. If this is out of the aforementioned range listed, please consider follow up with your Primary Care Provider.  If you are age 71 or younger, your body mass index should be between 19-25. Your Body mass index is 30.87 kg/m. If this is out of the aformentioned range listed, please consider follow up with your Primary Care Provider.   ________________________________________________________  The [facility] providers would like to encourage you to use [facility] to communicate with providers for non-urgent requests or questions.  Due to long hold times on the telephone, sending your provider a message by [facility] may be a faster and more efficient way to get a response.  Please allow 48 business hours for a response.  Please remember that this is for  non-urgent requests.  _______________________________________________________ It was a pleasure to see you today!  Thank you for trusting me with your gastrointestinal care!

## 2023-02-09 NOTE — Progress Notes (Signed)
Addendum: Reviewed and agree with assessment and management plan. Low threshold for cross-sectional imaging Thanks for seeing him today Caysie Minnifield, Carie Caddy, MD

## 2023-02-09 NOTE — Telephone Encounter (Signed)
P[patient calling in regards to prescriptions. Please advise.

## 2023-02-12 ENCOUNTER — Telehealth: Payer: Self-pay | Admitting: Physician Assistant

## 2023-02-12 DIAGNOSIS — R1032 Left lower quadrant pain: Secondary | ICD-10-CM

## 2023-02-12 NOTE — Telephone Encounter (Signed)
Diatherix Shows norovirus, this is a self limiting virus that causes diarrhea, AB pain and nausea/vomiting. Your symptoms should improve on their own. Please stay hydrated and schedule a follow up if not improving.  Dicyclomine is helping some. 50% better but still with lower left AB pain, no fever, chills.  Will proceed with CT AB and pelvis at [facility] to rule out diverticulitis, STAT.

## 2023-02-12 NOTE — Telephone Encounter (Signed)
Pt scheduled for CT of A/P at [facility] 02/13/23 at 9:45am. Pt to pick up contrast today by 5pm at their location and they will instruct him how to drink the contrast. Pt aware of appt. Order faxed to [phone].

## 2023-02-13 ENCOUNTER — Telehealth: Payer: Self-pay

## 2023-02-13 NOTE — Telephone Encounter (Signed)
*  Gastro  .Pharmacy Patient Advocate Encounter   Received notification from CoverMyMeds that prior authorization for Dicyclomine HCl 20MG  tablets  is required/requested.   Insurance verification completed.   The patient is insured through [employer] [facility] .   Per test claim: PA required; PA submitted to [employer] [facility] via CoverMyMeds Key/confirmation #/[id number] [facility] Status is pending

## 2023-02-13 NOTE — Telephone Encounter (Signed)
Pharmacy Patient Advocate Encounter  Received notification from [employer] [facility] that Prior Authorization for Dicyclomine 20mg  tablets has been APPROVED from 02/13/2023 to 02/13/2024   PA #/Case ID/Reference #: [facility]

## 2023-02-16 ENCOUNTER — Encounter: Payer: Self-pay | Admitting: Physician Assistant

## 2023-03-12 ENCOUNTER — Encounter (HOSPITAL_COMMUNITY): Payer: Self-pay | Admitting: Gastroenterology

## 2023-03-12 ENCOUNTER — Other Ambulatory Visit: Payer: Self-pay | Admitting: Gastroenterology

## 2023-03-12 ENCOUNTER — Other Ambulatory Visit: Payer: Self-pay

## 2023-03-12 NOTE — Progress Notes (Addendum)
COVID Vaccine Completed:  Date of COVID positive in last 90 days:  PCP - Argentina Ponder, FNP Cardiologist - Alvia Grove, MD   Chest x-ray -  EKG -  Stress Test - 11-10-14 [facility] ECHO - 02-01-15 [facility] Cardiac Cath -  Pacemaker/ICD device last checked: Spinal Cord Stimulator:  Bowel Prep -   Sleep Study - Yes, +sleep apnea CPAP - Yes  Fasting Blood Sugar - Low 100 range Checks Blood Sugar - checks daily   Ozempic Last dose of GLP1 agonist-  03-07-23 GLP1 instructions:  Hold until after procedure   Farxiga Last dose of SGLT-2 inhibitors-  N/A SGLT-2 instructions: Hold 3 days before procedure   Blood Thinner Instructions:  Xarelto.  Will take last dose on 03-16-23 Aspirin Instructions:  ASA 81.  Will take last dose on 03-16-23 Last Dose:  Activity level:  Can go up a flight of stairs and perform activities of daily living without stopping and without symptoms of chest pain or shortness of breath.   Anesthesia review:  CAD, CHF, COPD, Hx of MI, DM, OSA, AFib  Patient denies shortness of breath, fever, cough and chest pain at PAT appointment  Patient verbalized understanding of instructions that were given to them at the PAT appointment. Patient was also instructed that they will need to review over the PAT instructions again at home before surgery.

## 2023-03-19 ENCOUNTER — Encounter (HOSPITAL_COMMUNITY)
Admission: RE | Disposition: A | Discharge status: Home / Self Care | Payer: Self-pay | Source: Home / Self Care | Attending: Gastroenterology

## 2023-03-19 ENCOUNTER — Encounter (HOSPITAL_COMMUNITY): Payer: Self-pay | Admitting: Gastroenterology

## 2023-03-19 ENCOUNTER — Ambulatory Visit (HOSPITAL_COMMUNITY): Payer: Medicare Other | Admitting: Anesthesiology

## 2023-03-19 ENCOUNTER — Ambulatory Visit (HOSPITAL_COMMUNITY)
Admission: RE | Admit: 2023-03-19 | Discharge: 2023-03-19 | Disposition: A | Discharge status: Home / Self Care | Payer: Medicare Other | Attending: Gastroenterology | Admitting: Gastroenterology

## 2023-03-19 ENCOUNTER — Other Ambulatory Visit: Payer: Self-pay

## 2023-03-19 DIAGNOSIS — E119 Type 2 diabetes mellitus without complications: Secondary | ICD-10-CM | POA: Insufficient documentation

## 2023-03-19 DIAGNOSIS — I110 Hypertensive heart disease with heart failure: Secondary | ICD-10-CM | POA: Diagnosis not present

## 2023-03-19 DIAGNOSIS — Z951 Presence of aortocoronary bypass graft: Secondary | ICD-10-CM | POA: Insufficient documentation

## 2023-03-19 DIAGNOSIS — Z8719 Personal history of other diseases of the digestive system: Secondary | ICD-10-CM | POA: Diagnosis not present

## 2023-03-19 DIAGNOSIS — Z9889 Other specified postprocedural states: Secondary | ICD-10-CM

## 2023-03-19 DIAGNOSIS — J449 Chronic obstructive pulmonary disease, unspecified: Secondary | ICD-10-CM | POA: Diagnosis not present

## 2023-03-19 DIAGNOSIS — I252 Old myocardial infarction: Secondary | ICD-10-CM | POA: Diagnosis not present

## 2023-03-19 DIAGNOSIS — I509 Heart failure, unspecified: Secondary | ICD-10-CM | POA: Diagnosis not present

## 2023-03-19 DIAGNOSIS — Q399 Congenital malformation of esophagus, unspecified: Secondary | ICD-10-CM | POA: Diagnosis not present

## 2023-03-19 DIAGNOSIS — Z4659 Encounter for fitting and adjustment of other gastrointestinal appliance and device: Secondary | ICD-10-CM | POA: Diagnosis present

## 2023-03-19 DIAGNOSIS — G4730 Sleep apnea, unspecified: Secondary | ICD-10-CM | POA: Diagnosis not present

## 2023-03-19 DIAGNOSIS — I480 Paroxysmal atrial fibrillation: Secondary | ICD-10-CM | POA: Insufficient documentation

## 2023-03-19 DIAGNOSIS — I4891 Unspecified atrial fibrillation: Secondary | ICD-10-CM

## 2023-03-19 DIAGNOSIS — K56699 Other intestinal obstruction unspecified as to partial versus complete obstruction: Secondary | ICD-10-CM | POA: Diagnosis present

## 2023-03-19 DIAGNOSIS — K2289 Other specified disease of esophagus: Secondary | ICD-10-CM | POA: Insufficient documentation

## 2023-03-19 DIAGNOSIS — I2510 Atherosclerotic heart disease of native coronary artery without angina pectoris: Secondary | ICD-10-CM

## 2023-03-19 DIAGNOSIS — K3189 Other diseases of stomach and duodenum: Secondary | ICD-10-CM | POA: Diagnosis not present

## 2023-03-19 DIAGNOSIS — M1990 Unspecified osteoarthritis, unspecified site: Secondary | ICD-10-CM | POA: Insufficient documentation

## 2023-03-19 DIAGNOSIS — F17210 Nicotine dependence, cigarettes, uncomplicated: Secondary | ICD-10-CM

## 2023-03-19 DIAGNOSIS — K315 Obstruction of duodenum: Secondary | ICD-10-CM

## 2023-03-19 DIAGNOSIS — R140 Abdominal distension (gaseous): Secondary | ICD-10-CM

## 2023-03-19 HISTORY — PX: ESOPHAGOGASTRODUODENOSCOPY (EGD) WITH PROPOFOL: SHX5813

## 2023-03-19 HISTORY — PX: STENT REMOVAL: SHX6421

## 2023-03-19 HISTORY — PX: KENALOG INJECTION: SHX5298

## 2023-03-19 LAB — GLUCOSE, CAPILLARY: Glucose-Capillary: 102 mg/dL — ABNORMAL HIGH (ref 70–99)

## 2023-03-19 SURGERY — ESOPHAGOGASTRODUODENOSCOPY (EGD) WITH PROPOFOL
Anesthesia: Monitor Anesthesia Care

## 2023-03-19 MED ORDER — PROPOFOL 10 MG/ML IV BOLUS
INTRAVENOUS | Status: AC
  Filled 2023-03-19: qty 20

## 2023-03-19 MED ORDER — SODIUM CHLORIDE (PF) 0.9 % IJ SOLN
INTRAMUSCULAR | Status: AC
  Filled 2023-03-19: qty 10

## 2023-03-19 MED ORDER — PROPOFOL 10 MG/ML IV BOLUS
INTRAVENOUS | Status: DC | PRN
  Administered 2023-03-19: 20 mg via INTRAVENOUS
  Administered 2023-03-19 (×2): 30 mg via INTRAVENOUS

## 2023-03-19 MED ORDER — PROPOFOL 500 MG/50ML IV EMUL
INTRAVENOUS | Status: AC
  Filled 2023-03-19: qty 50

## 2023-03-19 MED ORDER — TRIAMCINOLONE ACETONIDE 40 MG/ML IJ SUSP
INTRAMUSCULAR | Status: DC | PRN
Start: 2023-03-19 — End: 2023-03-19
  Administered 2023-03-19: 40 mg

## 2023-03-19 MED ORDER — LIDOCAINE 2% (20 MG/ML) 5 ML SYRINGE
INTRAMUSCULAR | Status: DC | PRN
Start: 2023-03-19 — End: 2023-03-19
  Administered 2023-03-19: 100 mg via INTRAVENOUS

## 2023-03-19 MED ORDER — LACTATED RINGERS IV SOLN: INTRAVENOUS | Status: DC

## 2023-03-19 MED ORDER — TRIAMCINOLONE ACETONIDE 40 MG/ML IJ SUSP
INTRAMUSCULAR | Status: AC
  Filled 2023-03-19: qty 1

## 2023-03-19 MED ORDER — XARELTO 20 MG PO TABS: 20.0000 mg | ORAL_TABLET | Freq: Every day | ORAL | Status: DC

## 2023-03-19 MED ORDER — SODIUM CHLORIDE 0.9 % IV SOLN: INTRAVENOUS | Status: DC

## 2023-03-19 MED ORDER — PROPOFOL 500 MG/50ML IV EMUL
INTRAVENOUS | Status: DC | PRN
  Administered 2023-03-19: 150 ug/kg/min via INTRAVENOUS

## 2023-03-19 SURGICAL SUPPLY — 15 items

## 2023-03-19 NOTE — Anesthesia Postprocedure Evaluation (Signed)
Anesthesia Post Note  Patient: Nathan Cross  Procedure(s) Performed: ESOPHAGOGASTRODUODENOSCOPY (EGD) WITH PROPOFOL STENT REMOVAL Balloon dilation wire-guided KENALOG INJECTION     Patient location during evaluation: PACU Anesthesia Type: MAC Level of consciousness: awake and alert Pain management: pain level controlled Vital Signs Assessment: post-procedure vital signs reviewed and stable Respiratory status: spontaneous breathing, nonlabored ventilation and respiratory function stable Cardiovascular status: blood pressure returned to baseline and stable Postop Assessment: no apparent nausea or vomiting Anesthetic complications: no   No notable events documented.  Last Vitals:  Vitals:   03/19/23 1420 03/19/23 1430  BP: (!) 102/53 (!) 105/59  Pulse: (!) 58 (!) 56  Resp: 18 18  Temp: 36.5 C   SpO2: 98% 97%    Last Pain:  Vitals:   03/19/23 1430  TempSrc:   PainSc: 0-No pain                 Lannie Fields

## 2023-03-19 NOTE — Discharge Instructions (Signed)
YOU HAD AN ENDOSCOPIC PROCEDURE TODAY: Refer to the procedure report and other information in the discharge instructions given to you for any specific questions about what was found during the examination. If this information does not answer your questions, please call [place] office at [phone] to clarify.   YOU SHOULD EXPECT: Some feelings of bloating in the abdomen. Passage of more gas than usual. Walking can help get rid of the air that was put into your GI tract during the procedure and reduce the bloating. If you had a lower endoscopy (such as a colonoscopy or flexible sigmoidoscopy) you may notice spotting of blood in your stool or on the toilet paper. Some abdominal soreness may be present for a day or two, also.  DIET: Your first meal following the procedure should be a light meal and then it is ok to progress to your normal diet. A half-sandwich or bowl of soup is an example of a good first meal. Heavy or fried foods are harder to digest and may make you feel nauseous or bloated. Drink plenty of fluids but you should avoid alcoholic beverages for 24 hours. If you had a esophageal dilation, please see attached instructions for diet.    ACTIVITY: Your care partner should take you home directly after the procedure. You should plan to take it easy, moving slowly for the rest of the day. You can resume normal activity the day after the procedure however YOU SHOULD NOT DRIVE, use power tools, machinery or perform tasks that involve climbing or major physical exertion for 24 hours (because of the sedation medicines used during the test).   SYMPTOMS TO REPORT IMMEDIATELY: A gastroenterologist can be reached at any hour. Please call [phone]  for any of the following symptoms:  Following lower endoscopy (colonoscopy, flexible sigmoidoscopy) Excessive amounts of blood in the stool  Significant tenderness, worsening of abdominal pains  Swelling of the abdomen that is new, acute  Fever of 100 or  higher  Following upper endoscopy (EGD, EUS, ERCP, esophageal dilation) Vomiting of blood or coffee ground material  New, significant abdominal pain  New, significant chest pain or pain under the shoulder blades  Painful or persistently difficult swallowing  New shortness of breath  Black, tarry-looking or red, bloody stools  FOLLOW UP:  If any biopsies were taken you will be contacted by phone or by letter within the next 1-3 weeks. Call [phone]  if you have not heard about the biopsies in 3 weeks.  Please also call with any specific questions about appointments or follow up tests.YOU HAD AN ENDOSCOPIC PROCEDURE TODAY: Refer to the procedure report and other information in the discharge instructions given to you for any specific questions about what was found during the examination. If this information does not answer your questions, please call [place] office at [phone] to clarify.   YOU SHOULD EXPECT: Some feelings of bloating in the abdomen. Passage of more gas than usual. Walking can help get rid of the air that was put into your GI tract during the procedure and reduce the bloating. If you had a lower endoscopy (such as a colonoscopy or flexible sigmoidoscopy) you may notice spotting of blood in your stool or on the toilet paper. Some abdominal soreness may be present for a day or two, also.  DIET: Your first meal following the procedure should be a light meal and then it is ok to progress to your normal diet. A half-sandwich or bowl of soup is an example of a  good first meal. Heavy or fried foods are harder to digest and may make you feel nauseous or bloated. Drink plenty of fluids but you should avoid alcoholic beverages for 24 hours. If you had a esophageal dilation, please see attached instructions for diet.    ACTIVITY: Your care partner should take you home directly after the procedure. You should plan to take it easy, moving slowly for the rest of the day. You can resume  normal activity the day after the procedure however YOU SHOULD NOT DRIVE, use power tools, machinery or perform tasks that involve climbing or major physical exertion for 24 hours (because of the sedation medicines used during the test).   SYMPTOMS TO REPORT IMMEDIATELY: A gastroenterologist can be reached at any hour. Please call [phone]  for any of the following symptoms:  Following lower endoscopy (colonoscopy, flexible sigmoidoscopy) Excessive amounts of blood in the stool  Significant tenderness, worsening of abdominal pains  Swelling of the abdomen that is new, acute  Fever of 100 or higher  Following upper endoscopy (EGD, EUS, ERCP, esophageal dilation) Vomiting of blood or coffee ground material  New, significant abdominal pain  New, significant chest pain or pain under the shoulder blades  Painful or persistently difficult swallowing  New shortness of breath  Black, tarry-looking or red, bloody stools  FOLLOW UP:  If any biopsies were taken you will be contacted by phone or by letter within the next 1-3 weeks. Call [phone]  if you have not heard about the biopsies in 3 weeks.  Please also call with any specific questions about appointments or follow up tests.

## 2023-03-19 NOTE — Transfer of Care (Signed)
Immediate Anesthesia Transfer of Care Note  Patient: Nathan Cross  Procedure(s) Performed: ESOPHAGOGASTRODUODENOSCOPY (EGD) WITH PROPOFOL STENT REMOVAL Balloon dilation wire-guided KENALOG INJECTION  Patient Location: PACU  Anesthesia Type:MAC  Level of Consciousness: sedated  Airway & Oxygen Therapy: Patient Spontanous Breathing and Patient connected to face mask oxygen  Post-op Assessment: Report given to RN and Post -op Vital signs reviewed and stable  Post vital signs: Reviewed and stable  Last Vitals:  Vitals Value Taken Time  BP    Temp    Pulse 56 03/19/23 1416  Resp    SpO2 99 % 03/19/23 1416  Vitals shown include unfiled device data.  Last Pain:  Vitals:   03/19/23 1303  TempSrc: Temporal  PainSc: 0-No pain         Complications: No notable events documented.

## 2023-03-19 NOTE — Anesthesia Preprocedure Evaluation (Addendum)
Anesthesia Evaluation  Patient identified by MRN, date of birth, ID band Patient awake    Reviewed: Allergy & Precautions, H&P , NPO status , Patient's Chart, lab work & pertinent test results  Airway Mallampati: III  TM Distance: >3 FB Neck ROM: Full    Dental  (+) Dental Advisory Given, Caps   Pulmonary sleep apnea and Continuous Positive Airway Pressure Ventilation , COPD,  COPD inhaler, Current Smoker   Pulmonary exam normal breath sounds clear to auscultation       Cardiovascular hypertension, Pt. on medications and Pt. on home beta blockers + CAD, + Past MI, + CABG and +CHF  Normal cardiovascular exam+ dysrhythmias Atrial Fibrillation  Rhythm:Regular Rate:Normal     Neuro/Psych   Anxiety     negative neurological ROS     GI/Hepatic Neg liver ROS,GERD  Medicated,,  Endo/Other  diabetesHypothyroidism    Renal/GU negative Renal ROS     Musculoskeletal  (+) Arthritis , Osteoarthritis,    Abdominal  (+) + obese  Peds  Hematology negative hematology ROS (+)   Anesthesia Other Findings   Reproductive/Obstetrics negative OB ROS                             Anesthesia Physical Anesthesia Plan  ASA: 3  Anesthesia Plan: General and MAC   Post-op Pain Management: Minimal or no pain anticipated   Induction: Intravenous  PONV Risk Score and Plan: 0 and Propofol infusion, TIVA, Treatment may vary due to age or medical condition, Ondansetron and Dexamethasone  Airway Management Planned:   Additional Equipment:   Intra-op Plan:   Post-operative Plan:   Informed Consent: I have reviewed the patients History and Physical, chart, labs and discussed the procedure including the risks, benefits and alternatives for the proposed anesthesia with the patient or authorized representative who has indicated his/her understanding and acceptance.     Dental advisory given  Plan Discussed with:  CRNA  Anesthesia Plan Comments:        Anesthesia Quick Evaluation

## 2023-03-19 NOTE — H&P (Signed)
GASTROENTEROLOGY PROCEDURE H&P NOTE   Primary Care Physician: Vanessa [place], FNP  HPI: Nathan Cross is a 72 y.o. male who presents for EGD for duodenal stenosis followup and AXIOS removal.  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   Dysphagia 04/2020   Dysrhythmia    Paroxysmal atrial fibrillation    ED (erectile dysfunction)    GERD (gastroesophageal reflux disease)    History of eye prosthesis    Left   Hyperlipidemia    controlled on meds    Hypertension    Hypothyroidism    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)   Myocardial infarction (HCC)    PAF (paroxysmal atrial fibrillation) (HCC)    Sleep apnea    wears cpap    Zenker's diverticulum    Past Surgical History:  Procedure Laterality Date   ANTERIOR LAT LUMBAR FUSION N/A 06/10/2019   Procedure: Lumbar one-two , Lumbar two-three, Lumbar three-four- Anterolateral decompression/fusion with percutaneous pedicle screw fixation;  Surgeon: Maeola Harman, MD;  Location: [facility] OR;  Service: Neurosurgery;  Laterality: N/A;   BALLOON DILATION N/A 05/27/2020   Procedure: BALLOON DILATION;  Surgeon: Meridee Score Netty Starring., MD;  Location: [facility] ENDOSCOPY;  Service: Gastroenterology;  Laterality: N/A;   BALLOON DILATION N/A 06/28/2020   Procedure: BALLOON DILATION;  Surgeon: Meridee Score Netty Starring., MD;  Location: [facility] ENDOSCOPY;  Service: Gastroenterology;  Laterality: N/A;   BALLOON DILATION N/A 09/06/2020   Procedure: BALLOON DILATION;  Surgeon: Meridee Score Netty Starring., MD;  Location: [facility] ENDOSCOPY;  Service: Gastroenterology;  Laterality: N/A;   BIOPSY  06/28/2020    Procedure: BIOPSY;  Surgeon: Meridee Score Netty Starring., MD;  Location: [facility] ENDOSCOPY;  Service: Gastroenterology;;   BIOPSY  12/11/2022   Procedure: BIOPSY;  Surgeon: Lemar Lofty., MD;  Location: [facility] ENDOSCOPY;  Service: Gastroenterology;;   CARDIAC CATHETERIZATION  11/09/2017   COLONOSCOPY     CORONARY ARTERY BYPASS GRAFT  2004   CABG x 4    DUODENAL STENT PLACEMENT N/A 01/02/2023   Procedure: DUODENAL STENT PLACEMENT;  Surgeon: Lemar Lofty., MD;  Location: [facility] ENDOSCOPY;  Service: Gastroenterology;  Laterality: N/A;   ELECTROPHYSIOLOGIC STUDY  05/05/2020   [facility] - ABLATION A-fib pulm vein     ESOPHAGOGASTRODUODENOSCOPY (EGD) WITH PROPOFOL N/A 05/27/2020   Procedure: ESOPHAGOGASTRODUODENOSCOPY (EGD) WITH PROPOFOL;  Surgeon: Meridee Score Netty Starring., MD;  Location: [facility] ENDOSCOPY;  Service: Gastroenterology;  Laterality: N/A;   ESOPHAGOGASTRODUODENOSCOPY (EGD) WITH PROPOFOL N/A 06/28/2020   Procedure: ESOPHAGOGASTRODUODENOSCOPY (EGD) WITH PROPOFOL;  Surgeon: Meridee Score Netty Starring., MD;  Location: [facility] ENDOSCOPY;  Service: Gastroenterology;  Laterality: N/A;  Fluoro    ESOPHAGOGASTRODUODENOSCOPY (EGD) WITH PROPOFOL N/A 09/06/2020   Procedure: ESOPHAGOGASTRODUODENOSCOPY (EGD) WITH PROPOFOL;  Surgeon: Meridee Score Netty Starring., MD;  Location: [facility] ENDOSCOPY;  Service: Gastroenterology;  Laterality: N/A;  Fluoro and ultras slim scope needed.    ESOPHAGOGASTRODUODENOSCOPY (EGD) WITH PROPOFOL N/A 12/11/2022   Procedure: ESOPHAGOGASTRODUODENOSCOPY (EGD) WITH PROPOFOL;  Surgeon: Meridee Score Netty Starring., MD;  Location: [facility] ENDOSCOPY;  Service: Gastroenterology;  Laterality: N/A;   ESOPHAGOGASTRODUODENOSCOPY (  EGD) WITH PROPOFOL N/A 01/02/2023   Procedure: ESOPHAGOGASTRODUODENOSCOPY (EGD) WITH PROPOFOL;  Surgeon: Meridee Score Netty Starring., MD;  Location: [facility] ENDOSCOPY;  Service: Gastroenterology;  Laterality: N/A;   FOREIGN BODY REMOVAL  05/27/2020   Procedure: FOREIGN BODY REMOVAL;  Surgeon: Meridee Score  Netty Starring., MD;  Location: [facility] ENDOSCOPY;  Service: Gastroenterology;;   FOREIGN BODY REMOVAL  06/28/2020   Procedure: FOREIGN BODY REMOVAL;  Surgeon: Meridee Score Netty Starring., MD;  Location: [facility] ENDOSCOPY;  Service: Gastroenterology;;   FOREIGN BODY REMOVAL  09/06/2020   Procedure: FOREIGN BODY REMOVAL;  Surgeon: Lemar Lofty., MD;  Location: [facility] ENDOSCOPY;  Service: Gastroenterology;;   FOREIGN BODY REMOVAL  12/11/2022   Procedure: FOREIGN BODY REMOVAL;  Surgeon: Lemar Lofty., MD;  Location: [facility] ENDOSCOPY;  Service: Gastroenterology;;   HERNIA REPAIR     KIDNEY DONATION Right 1996   LUMBAR PERCUTANEOUS PEDICLE SCREW 3 LEVEL N/A 06/10/2019   Procedure: LUMBAR PERCUTANEOUS PEDICLE SCREW 3 LEVEL;  Surgeon: Maeola Harman, MD;  Location: [facility] OR;  Service: Neurosurgery;  Laterality: N/A;   MEDIAL PARTIAL KNEE REPLACEMENT Bilateral    POLYPECTOMY     Prosthetic Eye Left    STENT REMOVAL  12/11/2022   Procedure: AXIOS STENT PLACEMENT;  Surgeon: Lemar Lofty., MD;  Location: [facility] ENDOSCOPY;  Service: Gastroenterology;;   Francine Graven REMOVAL  01/02/2023   Procedure: STENT REMOVAL;  Surgeon: Lemar Lofty., MD;  Location: [facility] ENDOSCOPY;  Service: Gastroenterology;;   TONSILLECTOMY     as child    UPPER GASTROINTESTINAL ENDOSCOPY     Current Facility-Administered Medications  Medication Dose Route Frequency Provider Last Rate Last Admin   0.9 %  sodium chloride infusion   Intravenous Continuous Mansouraty, Netty Starring., MD       lactated ringers infusion   Intravenous Continuous Mansouraty, Netty Starring., MD 10 mL/hr at 03/19/23 1342 Restarted at 03/19/23 1409    Current Facility-Administered Medications:    0.9 %  sodium chloride infusion, , Intravenous, Continuous, Mansouraty, Netty Starring., MD   lactated ringers infusion, , Intravenous, Continuous, Mansouraty, Netty Starring., MD, Last Rate: 10 mL/hr at 03/19/23 1342, Restarted at 03/19/23 1409 No Known Allergies Family History   Problem Relation Age of Onset   Colon cancer Neg Hx    Stomach cancer Neg Hx    Pancreatic cancer Neg Hx    Esophageal cancer Neg Hx    Colon polyps Neg Hx    Rectal cancer Neg Hx    Social History   Socioeconomic History   Marital status: Married    Spouse name: Not on file   Number of children: Not on file   Years of education: Not on file   Highest education level: Not on file  Occupational History   Not on file  Tobacco Use   Smoking status: Some Days    Current packs/day: 0.00    Types: Cigarettes    Last attempt to quit: 02/25/2020    Years since quitting: 3.0   Smokeless tobacco: Never  Vaping Use   Vaping status: Never Used  Substance and Sexual Activity   Alcohol use: Yes    Comment: occassionally   Drug use: Never   Sexual activity: Yes  Other Topics Concern   Not on file  Social History Narrative   Not on file   Social Determinants of Health   Financial Resource Strain: Not on file  Food Insecurity: Not on file  Transportation Needs: Not on file  Physical Activity: Not on file  Stress: Not  on file  Social Connections: Not on file  Intimate Partner Violence: Not on file    Physical Exam: Today's Vitals   03/12/23 1328 03/19/23 1303  BP:  134/72  Pulse:  60  Resp:  16  Temp:  97.9 F (36.6 C)  TempSrc:  Temporal  SpO2:  98%  Weight: 112 kg 112 kg  Height: 6\' 3"  (1.905 m) 6\' 3"  (1.905 m)  PainSc:  0-No pain   Body mass index is 30.87 kg/m. GEN: NAD EYE: Sclerae anicteric ENT: MMM CV: Non-tachycardic GI: Soft, NT/ND NEURO:  Alert & Oriented x 3  Lab Results: No results for input(s): "WBC", "HGB", "HCT", "PLT" in the last 72 hours. BMET No results for input(s): "NA", "K", "CL", "CO2", "GLUCOSE", "BUN", "CREATININE", "CALCIUM" in the last 72 hours. LFT No results for input(s): "PROT", "ALBUMIN", "AST", "ALT", "ALKPHOS", "BILITOT", "BILIDIR", "IBILI" in the last 72 hours. PT/INR No results for input(s): "LABPROT", "INR" in the last 72  hours.   Impression / Plan: This is a 72 y.o.male who presents for EGD for duodenal stenosis followup and AXIOS removal.  The risks and benefits of endoscopic evaluation/treatment were discussed with the patient and/or family; these include but are not limited to the risk of perforation, infection, bleeding, missed lesions, lack of diagnosis, severe illness requiring hospitalization, as well as anesthesia and sedation related illnesses.  The patient's history has been reviewed, patient examined, no change in status, and deemed stable for procedure.  The patient and/or family is agreeable to proceed.    Corliss Parish, MD [facility] Office # [phone]

## 2023-03-19 NOTE — Op Note (Signed)
[facility] Patient Name: Nathan Cross Procedure Date: 03/19/2023 MRN: [id number] Attending MD: Corliss Parish , MD, [id number] Date of Birth: 1951-02-05 CSN: [id number] Age: 72 Admit Type: Inpatient Procedure:                Upper GI endoscopy Indications:              Stenosis of the duodenum, Follow-up of duodenal                            stenosis, Stent removal Providers:                Corliss Parish, MD, Suzy Bouchard, RN, Fransisca Connors, Marja Kays, Technician Referring MD:             Carie Caddy. Pyrtle, MD, Amy B. Kathyrn Sheriff MD, MD Medicines:                Monitored Anesthesia Care Complications:            No immediate complications. Estimated Blood Loss:     Estimated blood loss was minimal. Procedure:                Pre-Anesthesia Assessment:                           - Prior to the procedure, a History and Physical                            was performed, and patient medications and                            allergies were reviewed. The patient's tolerance of                            previous anesthesia was also reviewed. The risks                            and benefits of the procedure and the sedation                            options and risks were discussed with the patient.                            All questions were answered, and informed consent                            was obtained. Prior Anticoagulants: The patient has                            taken Xarelto (rivaroxaban), last dose was 1 day                            prior to procedure. ASA Grade Assessment: III - A  patient with severe systemic disease. After                            reviewing the risks and benefits, the patient was                            deemed in satisfactory condition to undergo the                            procedure.                           After obtaining informed consent, the endoscope was                             passed under direct vision. Throughout the                            procedure, the patient's blood pressure, pulse, and                            oxygen saturations were monitored continuously. The                            [id number] ([id number]) Olympus therapeutic endoscope                            was introduced through the mouth, and advanced to                            the second part of duodenum. The upper GI endoscopy                            was accomplished without difficulty. The patient                            tolerated the procedure. Scope In: Scope Out: Findings:      No gross lesions were noted in the entire esophagus.      The distal esophagus was moderately tortuous.      The Z-line was irregular and was found 44 cm from the incisors.      Patchy mildly erythematous mucosa without bleeding was found in the       entire examined stomach.      No gross lesions were noted in the duodenal bulb.      A previously placed AXIOS LAMS metal stent was seen in the duodenal       sweep. Stent removal was accomplished with a Raptor grasping device.      Diffuse moderately congested mucosa without active bleeding and with no       stigmata of bleeding was found in the duodenal sweep without overt       stenosis but significant inflammation from recent AXIOS stents having       been in place. To decrease risk of recurrent stricture a TTS dilator was       passed through the scope. Dilation with a  15-16.5-18 mm pyloric balloon       dilator was performed. The dilation site was examined following       endoscope reinsertion and showed no change. Area was successfully       injected with 4 mL of triamcinolone (10 mg/mL) for to decrease risk of       recurrence of stricturing disease.      No gross lesions were noted in the second portion of the duodenum. Impression:               - No gross lesions in the entire esophagus.                           -  Tortuous distal esophagus.                           - Z-line irregular, 44 cm from the incisors.                           - Erythematous mucosa in the stomach.                           - No gross lesions in the duodenal bulb.                           - Metal AXIOS LAMS cystgastrostomy in the duodenal                            sweep. Removed.                           - Congested duodenal mucosa in the sweep. Dilated                            up to 18 mm. Injected to decrease risk of                            recurrence.                           - No gross lesions in the second portion of the                            duodenum. Moderate Sedation:      Not Applicable - Patient had care per Anesthesia. Recommendation:           - The patient will be observed post-procedure,                            until all discharge criteria are met.                           - Discharge patient to home.                           - Patient has a contact number available for  emergencies. The signs and symptoms of potential                            delayed complications were discussed with the                            patient. Return to normal activities tomorrow.                            Written discharge instructions were provided to the                            patient.                           - Resume previous diet.                           - Continue PPI 40 mg BID.                           - Continue Carafate twice daily.                           - Observe patient's clinical course.                           - Repeat EGD in 6-10 weeks to evaluate and be                            prepared to dilate the duodenal sweep.                           - The findings and recommendations were discussed                            with the patient.                           - The findings and recommendations were discussed                            with the  patient's family. Procedure Code(s):        --- Professional ---                           [phone], Esophagogastroduodenoscopy, flexible,                            transoral; with removal of foreign body(s)                           43245, Esophagogastroduodenoscopy, flexible,                            transoral; with dilation of gastric/duodenal  stricture(s) (eg, balloon, bougie) Diagnosis Code(s):        --- Professional ---                           Q39.9, Congenital malformation of esophagus,                            unspecified                           K22.89, Other specified disease of esophagus                           K31.89, Other diseases of stomach and duodenum                           Z46.59, Encounter for fitting and adjustment of                            other gastrointestinal appliance and device                           K31.5, Obstruction of duodenum CPT copyright 2022 [employer]. All rights reserved. The codes documented in this report are preliminary and upon coder review may  be revised to meet current compliance requirements. Corliss Parish, MD 03/19/2023 2:25:45 PM Number of Addenda: 0

## 2023-03-21 ENCOUNTER — Other Ambulatory Visit: Payer: Self-pay | Admitting: Nephrology

## 2023-03-21 ENCOUNTER — Encounter (HOSPITAL_COMMUNITY): Payer: Self-pay | Admitting: Gastroenterology

## 2023-03-21 ENCOUNTER — Telehealth: Payer: Self-pay

## 2023-03-21 DIAGNOSIS — N1831 Chronic kidney disease, stage 3a: Secondary | ICD-10-CM

## 2023-03-21 DIAGNOSIS — K315 Obstruction of duodenum: Secondary | ICD-10-CM

## 2023-03-21 NOTE — Telephone Encounter (Signed)
Pt scheduled for EGD in the [facility] with Dr. Rhea Belton 05/03/23 at 2:30pm. Left message for pt to call back.  Spoke with pt and he is aware of appt. Prep instructions mailed to pt. Amb ref in epic.

## 2023-03-21 NOTE — Telephone Encounter (Signed)
-----   Message from Carie Caddy Pyrtle sent at 03/19/2023  3:38 PM EDT ----- Regarding: RE: Followup Araminta Zorn EGD in [facility] in 6-8 weeks please Duodenal stricture Thanks JMP ----- Message ----- From: Lemar Lofty., MD Sent: 03/19/2023   2:18 PM EDT To: Chrystie Nose, RN; Beverley Fiedler, MD Subject: Followup                                       JMP, Please see EGD note. We have certainly been able to dilate both of his duodenal strictures.  I also dilated him up to 18 after the removal of the AXIOS. I would recommend you plan to repeat an endoscopy in 6 to 10 weeks so that he does not stricture like he did this go around. I think if he continues to have issues, he may need a partial duodenal resection. I think you will be able to do his procedure in the [facility] based on me being able to move the 18 mm dilator with a ease. Let me know if there is anything else that I can be of assistance with. [name], Patient will be awaiting your call for a procedure date. Thanks. GM

## 2023-05-03 ENCOUNTER — Encounter: Payer: Self-pay | Admitting: Internal Medicine

## 2023-05-03 ENCOUNTER — Ambulatory Visit: Payer: Medicare Other | Admitting: Internal Medicine

## 2023-05-03 VITALS — BP 104/68 | HR 59 | Temp 98.0°F | Resp 20 | Ht 75.0 in | Wt 247.0 lb

## 2023-05-03 DIAGNOSIS — K315 Obstruction of duodenum: Secondary | ICD-10-CM | POA: Diagnosis present

## 2023-05-03 MED ORDER — SODIUM CHLORIDE 0.9 % IV SOLN: 500.0000 mL | Freq: Once | INTRAVENOUS | Status: DC

## 2023-05-03 NOTE — Patient Instructions (Addendum)
Resume your Xarelto at prior dose tomorrow.  Resume your routine medications today as ordered.  Try to stop smoking.  Read your instructions carefully regarding your new Endoscopy in January.  You'll have to hold your xarelto and your ozempic , plus your diabetic medicines as directed.  Do not lose your instructions.   They were reviewed today by Letitia Libra, RN.    YOU HAD AN ENDOSCOPIC PROCEDURE TODAY AT THE [facility]:   Refer to the procedure report that was given to you for any specific questions about what was found during the examination.  If the procedure report does not answer your questions, please call your gastroenterologist to clarify.  If you requested that your care partner not be given the details of your procedure findings, then the procedure report has been included in a sealed envelope for you to review at your convenience later.  YOU SHOULD EXPECT: Some feelings of bloating in the abdomen. Passage of more gas than usual.   Please Note:  You might notice some irritation and congestion in your nose or some drainage.  This is from the oxygen used during your procedure.  There is no need for concern and it should clear up in a day or so.  SYMPTOMS TO REPORT IMMEDIATELY:   Following upper endoscopy (EGD)  Vomiting of blood or coffee ground material  New chest pain or pain under the shoulder blades  Painful or persistently difficult swallowing  New shortness of breath  Fever of 100F or higher  Black, tarry-looking stools  For urgent or emergent issues, a gastroenterologist can be reached at any hour by calling [phone]. Do not use MyChart messaging for urgent concerns.    DIET:  We do recommend a small meal at first, but then you may proceed to your regular diet.  Drink plenty of fluids but you should avoid alcoholic beverages for 24 hours.  ACTIVITY:  You should plan to take it easy for the rest of today and you should NOT DRIVE or use heavy machinery  until tomorrow (because of the sedation medicines used during the test).    FOLLOW UP: Our staff will call the number listed on your records the next business day following your procedure.  We will call around 7:15- 8:00 am to check on you and address any questions or concerns that you may have regarding the information given to you following your procedure. If we do not reach you, we will leave a message.      SIGNATURES/CONFIDENTIALITY: You and/or your care partner have signed paperwork which will be entered into your electronic medical record.  These signatures attest to the fact that that the information above on your After Visit Summary has been reviewed and is understood.  Full responsibility of the confidentiality of this discharge information lies with you and/or your care-partner.

## 2023-05-03 NOTE — Progress Notes (Signed)
Dr Rhea Belton stated to use the same instructions as today for his next EGD. The instructions were given to the patient by Letitia Libra, RN

## 2023-05-03 NOTE — Progress Notes (Signed)
Report to PACU, RN, vss, BBS= Clear.  

## 2023-05-03 NOTE — Progress Notes (Signed)
Called to room to assist during endoscopic procedure.  Patient ID and intended procedure confirmed with present staff. Received instructions for my participation in the procedure from the performing physician.  

## 2023-05-03 NOTE — Progress Notes (Signed)
GASTROENTEROLOGY PROCEDURE H&P NOTE   Primary Care Physician: Vanessa Wellsburg, FNP    Reason for Procedure:  Follow-up and dilation of known duodenal stricture  Plan:    EGD with dilation  Patient is appropriate for endoscopic procedure(s) in the ambulatory ([facility]) setting.  The nature of the procedure, as well as the risks, benefits, and alternatives were carefully and thoroughly reviewed with the patient. Ample time for discussion and questions allowed. The patient understood, was satisfied, and agreed to proceed.     HPI: Nathan Cross is a 72 y.o. male who presents for EGD with dilation.  Medical history as below.  No recent chest pain or shortness of breath.  No abdominal pain today. 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   Dysphagia 04/2020   Dysrhythmia    Paroxysmal atrial fibrillation    ED (erectile dysfunction)    GERD (gastroesophageal reflux disease)    History of eye prosthesis    Left   Hyperlipidemia    controlled on meds    Hypertension    Hypothyroidism    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)   Myocardial infarction (HCC)    PAF (paroxysmal atrial fibrillation) (HCC)    Sleep apnea    wears cpap    Zenker's diverticulum     Past Surgical History:  Procedure Laterality Date   ANTERIOR LAT LUMBAR FUSION N/A 06/10/2019   Procedure: Lumbar one-two , Lumbar two-three, Lumbar three-four- Anterolateral decompression/fusion with percutaneous pedicle screw fixation;  Surgeon: Maeola Harman, MD;  Location: [facility] OR;  Service: Neurosurgery;  Laterality: N/A;    BALLOON DILATION N/A 05/27/2020   Procedure: BALLOON DILATION;  Surgeon: Meridee Score Netty Starring., MD;  Location: [facility] ENDOSCOPY;  Service: Gastroenterology;  Laterality: N/A;   BALLOON DILATION N/A 06/28/2020   Procedure: BALLOON DILATION;  Surgeon: Meridee Score Netty Starring., MD;  Location: [facility] ENDOSCOPY;  Service: Gastroenterology;  Laterality: N/A;   BALLOON DILATION N/A 09/06/2020   Procedure: BALLOON DILATION;  Surgeon: Meridee Score Netty Starring., MD;  Location: [facility] ENDOSCOPY;  Service: Gastroenterology;  Laterality: N/A;   BIOPSY  06/28/2020   Procedure: BIOPSY;  Surgeon: Meridee Score Netty Starring., MD;  Location: [facility] ENDOSCOPY;  Service: Gastroenterology;;   BIOPSY  12/11/2022   Procedure: BIOPSY;  Surgeon: Lemar Lofty., MD;  Location: [facility] ENDOSCOPY;  Service: Gastroenterology;;   CARDIAC CATHETERIZATION  11/09/2017   COLONOSCOPY     CORONARY ARTERY BYPASS GRAFT  2004   CABG x 4    DUODENAL STENT PLACEMENT N/A 01/02/2023   Procedure: DUODENAL STENT PLACEMENT;  Surgeon: Lemar Lofty., MD;  Location: [facility] ENDOSCOPY;  Service: Gastroenterology;  Laterality: N/A;   ELECTROPHYSIOLOGIC STUDY  05/05/2020   [facility] - ABLATION A-fib pulm vein     ESOPHAGOGASTRODUODENOSCOPY (EGD) WITH PROPOFOL N/A 05/27/2020   Procedure: ESOPHAGOGASTRODUODENOSCOPY (EGD) WITH PROPOFOL;  Surgeon: Meridee Score Netty Starring., MD;  Location: [facility] ENDOSCOPY;  Service: Gastroenterology;  Laterality: N/A;   ESOPHAGOGASTRODUODENOSCOPY (EGD) WITH PROPOFOL N/A 06/28/2020   Procedure:  ESOPHAGOGASTRODUODENOSCOPY (EGD) WITH PROPOFOL;  Surgeon: Mansouraty, Netty Starring., MD;  Location: [facility] ENDOSCOPY;  Service: Gastroenterology;  Laterality: N/A;  Fluoro    ESOPHAGOGASTRODUODENOSCOPY (EGD) WITH PROPOFOL N/A 09/06/2020   Procedure: ESOPHAGOGASTRODUODENOSCOPY (EGD) WITH PROPOFOL;  Surgeon: Meridee Score Netty Starring., MD;  Location: [facility] ENDOSCOPY;  Service: Gastroenterology;  Laterality: N/A;  Fluoro and ultras slim scope needed.     ESOPHAGOGASTRODUODENOSCOPY (EGD) WITH PROPOFOL N/A 12/11/2022   Procedure: ESOPHAGOGASTRODUODENOSCOPY (EGD) WITH PROPOFOL;  Surgeon: Meridee Score Netty Starring., MD;  Location: [facility] ENDOSCOPY;  Service: Gastroenterology;  Laterality: N/A;   ESOPHAGOGASTRODUODENOSCOPY (EGD) WITH PROPOFOL N/A 01/02/2023   Procedure: ESOPHAGOGASTRODUODENOSCOPY (EGD) WITH PROPOFOL;  Surgeon: Meridee Score Netty Starring., MD;  Location: [facility] ENDOSCOPY;  Service: Gastroenterology;  Laterality: N/A;   ESOPHAGOGASTRODUODENOSCOPY (EGD) WITH PROPOFOL N/A 03/19/2023   Procedure: ESOPHAGOGASTRODUODENOSCOPY (EGD) WITH PROPOFOL;  Surgeon: Meridee Score Netty Starring., MD;  Location: [facility] ENDOSCOPY;  Service: Gastroenterology;  Laterality: N/A;   FOREIGN BODY REMOVAL  05/27/2020   Procedure: FOREIGN BODY REMOVAL;  Surgeon: Meridee Score Netty Starring., MD;  Location: [facility] ENDOSCOPY;  Service: Gastroenterology;;   FOREIGN BODY REMOVAL  06/28/2020   Procedure: FOREIGN BODY REMOVAL;  Surgeon: Meridee Score Netty Starring., MD;  Location: [facility] ENDOSCOPY;  Service: Gastroenterology;;   FOREIGN BODY REMOVAL  09/06/2020   Procedure: FOREIGN BODY REMOVAL;  Surgeon: Lemar Lofty., MD;  Location: [facility] ENDOSCOPY;  Service: Gastroenterology;;   FOREIGN BODY REMOVAL  12/11/2022   Procedure: FOREIGN BODY REMOVAL;  Surgeon: Lemar Lofty., MD;  Location: [facility] ENDOSCOPY;  Service: Gastroenterology;;   HERNIA REPAIR     KENALOG INJECTION  03/19/2023   Procedure: KENALOG INJECTION;  Surgeon: Lemar Lofty., MD;  Location: [facility] ENDOSCOPY;  Service: Gastroenterology;;   KIDNEY DONATION Right 1996   LUMBAR PERCUTANEOUS PEDICLE SCREW 3 LEVEL N/A 06/10/2019   Procedure: LUMBAR PERCUTANEOUS PEDICLE SCREW 3 LEVEL;  Surgeon: Maeola Harman, MD;  Location: [facility] OR;  Service: Neurosurgery;  Laterality: N/A;   MEDIAL PARTIAL KNEE REPLACEMENT Bilateral    POLYPECTOMY     Prosthetic Eye Left    STENT REMOVAL  12/11/2022   Procedure: AXIOS STENT PLACEMENT;  Surgeon: Lemar Lofty., MD;  Location: [facility] ENDOSCOPY;  Service: Gastroenterology;;   Francine Graven REMOVAL  01/02/2023   Procedure: STENT REMOVAL;  Surgeon: Lemar Lofty., MD;  Location: [facility] ENDOSCOPY;  Service: Gastroenterology;;   Francine Graven REMOVAL  03/19/2023   Procedure: STENT REMOVAL;  Surgeon: Lemar Lofty., MD;  Location: [facility] ENDOSCOPY;  Service: Gastroenterology;;   TONSILLECTOMY     as child    UPPER GASTROINTESTINAL ENDOSCOPY      Prior to Admission medications   Medication Sig Start Date End Date Taking? Authorizing Provider  amiodarone (PACERONE) 200 MG tablet Take 200 mg by mouth daily.   Yes [provider]  clonazePAM (KLONOPIN) 0.5 MG tablet Take 0.5 mg by mouth 2 (two) times daily. 04/01/19  Yes [provider]  docusate sodium (COLACE) 50 MG capsule Take 50 mg by mouth daily. Pt states he takes this every night   Yes [provider]  FARXIGA 10 MG TABS tablet Take 10 mg by mouth daily.   Yes [provider]  ferrous sulfate 325 (65 FE) MG tablet Take 325 mg by mouth daily with breakfast.   Yes [provider]  furosemide (LASIX) 20 MG tablet Take 20 mg by mouth daily.   Yes [provider]  gemfibrozil (LOPID) 600 MG tablet Take 600 mg by mouth daily.   Yes [provider]  icosapent Ethyl (VASCEPA) 1 g capsule Take 1 g by mouth daily.   Yes [provider]  levothyroxine (SYNTHROID) 50 MCG tablet Take 50 mcg by mouth daily before breakfast.   Yes [provider]  lisinopril (ZESTRIL) 40 MG tablet Take 40 mg by mouth daily.   Yes [provider]  metoprolol succinate (TOPROL-XL) 50 MG 24 hr tablet Take 50 mg by mouth 2 (two) times daily. Take with or immediately following a meal.   Yes [provider]  omeprazole (PRILOSEC) 40 MG capsule Take 1 capsule (40 mg total) by mouth 2 (two) times daily before a meal. 12/11/22  Yes Mansouraty, Netty Starring., MD  rosuvastatin (CRESTOR) 20 MG tablet  Take 20 mg by mouth daily.   Yes [provider]  sucralfate (CARAFATE) 1 g tablet Take 1 tablet (1 g total) by mouth 2 (two) times daily. 01/02/23 01/02/24 Yes Mansouraty, Netty Starring., MD  vitamin C (ASCORBIC ACID) 500 MG tablet Take 500 mg by mouth daily.   Yes [provider]  XARELTO 20 MG TABS tablet Take 1 tablet (20 mg total) by mouth daily. 03/20/23  Yes Mansouraty, Netty Starring., MD  albuterol (VENTOLIN HFA) 108 (90 Base) MCG/ACT inhaler Inhale 2 puffs into the lungs every 6 (six) hours as needed for wheezing or shortness of breath. 11/04/22   [provider]  AMBULATORY NON FORMULARY MEDICATION Medication Name: Diltiazem 2%/Lidocaine 2%   Using your index finger apply a small amount of medication inside the anal opening and to the external anal area twice daily x 6 weeks. 02/09/23   Doree Albee, PA-C  aspirin 81 MG chewable tablet Chew 81 mg by mouth daily. Swallow whole.    [provider]  dicyclomine (BENTYL) 20 MG tablet Take 1 tablet (20 mg total) by mouth 3 (three) times daily as needed for spasms (diarrhea). 02/09/23   Doree Albee, PA-C  hydrocortisone (ANUSOL-HC) 25 MG suppository Place 1 suppository (25 mg total) rectally 2 (two) times daily. 02/09/23   Doree Albee, PA-C  nitroGLYCERIN (NITROSTAT) 0.4 MG SL tablet Place 0.4 mg under the tongue every 5 (five) minutes x 3 doses as needed for chest pain.  12/01/19   [provider]  ondansetron (ZOFRAN-ODT) 8 MG disintegrating tablet TAKE 1 TABLET BY MOUTH EVERY 8 HOURS AS NEEDED FOR NAUSEA OR VOMITING. 03/12/23   Suraiya Dickerson, Carie Caddy, MD  psyllium (REGULOID) 0.52 g capsule Take 1.04 g by mouth daily. Patient not taking: Reported on 02/09/2023    [provider]  Semaglutide,0.25 or 0.5MG /DOS, (OZEMPIC, 0.25 OR 0.5 MG/DOSE,) 2 MG/1.5ML SOPN Inject 0.5 mg every week by subcutaneous route for 120 days. 12/05/22   [provider]    Current Outpatient Medications  Medication  Sig Dispense Refill   amiodarone (PACERONE) 200 MG tablet Take 200 mg by mouth daily.     clonazePAM (KLONOPIN) 0.5 MG tablet Take 0.5 mg by mouth 2 (two) times daily.     docusate sodium (COLACE) 50 MG capsule Take 50 mg by mouth daily. Pt states he takes this every night     FARXIGA 10 MG TABS tablet Take 10 mg by mouth daily.     ferrous sulfate 325 (65 FE) MG tablet Take 325 mg by mouth daily with breakfast.     furosemide (LASIX) 20 MG tablet Take 20 mg by mouth daily.     gemfibrozil (LOPID) 600 MG tablet Take 600 mg by mouth daily.     icosapent  Ethyl (VASCEPA) 1 g capsule Take 1 g by mouth daily.     levothyroxine (SYNTHROID) 50 MCG tablet Take 50 mcg by mouth daily before breakfast.     lisinopril (ZESTRIL) 40 MG tablet Take 40 mg by mouth daily.     metoprolol succinate (TOPROL-XL) 50 MG 24 hr tablet Take 50 mg by mouth 2 (two) times daily. Take with or immediately following a meal.     omeprazole (PRILOSEC) 40 MG capsule Take 1 capsule (40 mg total) by mouth 2 (two) times daily before a meal. 60 capsule 6   rosuvastatin (CRESTOR) 20 MG tablet Take 20 mg by mouth daily.     sucralfate (CARAFATE) 1 g tablet Take 1 tablet (1 g total) by mouth 2 (two) times daily. 60 tablet 11   vitamin C (ASCORBIC ACID) 500 MG tablet Take 500 mg by mouth daily.     XARELTO 20 MG TABS tablet Take 1 tablet (20 mg total) by mouth daily.     albuterol (VENTOLIN HFA) 108 (90 Base) MCG/ACT inhaler Inhale 2 puffs into the lungs every 6 (six) hours as needed for wheezing or shortness of breath.     AMBULATORY NON FORMULARY MEDICATION Medication Name: Diltiazem 2%/Lidocaine 2%   Using your index finger apply a small amount of medication inside the anal opening and to the external anal area twice daily x 6 weeks. 30 g 1   aspirin 81 MG chewable tablet Chew 81 mg by mouth daily. Swallow whole.     dicyclomine (BENTYL) 20 MG tablet Take 1 tablet (20 mg total) by mouth 3 (three) times daily as needed for spasms  (diarrhea). 90 tablet 3   hydrocortisone (ANUSOL-HC) 25 MG suppository Place 1 suppository (25 mg total) rectally 2 (two) times daily. 12 suppository 0   nitroGLYCERIN (NITROSTAT) 0.4 MG SL tablet Place 0.4 mg under the tongue every 5 (five) minutes x 3 doses as needed for chest pain.      ondansetron (ZOFRAN-ODT) 8 MG disintegrating tablet TAKE 1 TABLET BY MOUTH EVERY 8 HOURS AS NEEDED FOR NAUSEA OR VOMITING. 30 tablet 1   psyllium (REGULOID) 0.52 g capsule Take 1.04 g by mouth daily. (Patient not taking: Reported on 02/09/2023)     Semaglutide,0.25 or 0.5MG /DOS, (OZEMPIC, 0.25 OR 0.5 MG/DOSE,) 2 MG/1.5ML SOPN Inject 0.5 mg every week by subcutaneous route for 120 days.     Current Facility-Administered Medications  Medication Dose Route Frequency Provider Last Rate Last Admin   0.9 %  sodium chloride infusion  500 mL Intravenous Once Naod Sweetland, Carie Caddy, MD        Allergies as of 05/03/2023   (No Known Allergies)    Family History  Problem Relation Age of Onset   Colon cancer Neg Hx    Stomach cancer Neg Hx    Pancreatic cancer Neg Hx    Esophageal cancer Neg Hx    Colon polyps Neg Hx    Rectal cancer Neg Hx     Social History   Socioeconomic History   Marital status: Married    Spouse name: Stanton Kidney   Number of children: 3   Years of education: Not on file   Highest education level: Not on file  Occupational History   Not on file  Tobacco Use   Smoking status: Some Days    Current packs/day: 0.00    Types: Cigarettes    Last attempt to quit: 02/25/2020    Years since quitting: 3.1   Smokeless tobacco: Never  Vaping Use   Vaping status: Never Used  Substance and Sexual Activity   Alcohol use: Yes    Comment: occassionally   Drug use: Never   Sexual activity: Yes  Other Topics Concern   Not on file  Social History Narrative   Not on file   Social Determinants of Health   Financial Resource Strain: Not on file  Food Insecurity: Not on file  Transportation Needs: Not on  file  Physical Activity: Not on file  Stress: Not on file  Social Connections: Not on file  Intimate Partner Violence: Not on file    Physical Exam: Vital signs in last 24 hours: @BP  127/74   Pulse 66   Temp 98 F (36.7 C)   Ht 6\' 3"  (1.905 m)   Wt 247 lb (112 kg)   SpO2 96%   BMI 30.87 kg/m  GEN: NAD EYE: Sclerae anicteric ENT: MMM CV: Non-tachycardic Pulm: CTA b/l GI: Soft, NT/ND NEURO:  Alert & Oriented x 3   Erick Blinks, MD [facility]  05/03/2023 2:22 PM

## 2023-05-03 NOTE — Op Note (Addendum)
[facility] Patient Name: Nathan Cross Procedure Date: 05/03/2023 2:21 PM MRN: [id number] Endoscopist: Beverley Fiedler , MD, [id number] Age: 72 Referring MD:  Date of Birth: Dec 15, 1950 Gender: Male Account #: [id number] Procedure:                Upper GI endoscopy Indications:              For therapy of duodenal stenosis (previous Axios                            stent placement and dilation, last Sept 2024 with                            Axios removal and dilation to 18 mm) Medicines:                Monitored Anesthesia Care Procedure:                Pre-Anesthesia Assessment:                           - Prior to the procedure, a History and Physical                            was performed, and patient medications and                            allergies were reviewed. The patient's tolerance of                            previous anesthesia was also reviewed. The risks                            and benefits of the procedure and the sedation                            options and risks were discussed with the patient.                            All questions were answered, and informed consent                            was obtained. Prior Anticoagulants: The patient has                            taken Xarelto (rivaroxaban), last dose was 2 days                            prior to procedure. ASA Grade Assessment: III - A                            patient with severe systemic disease. After                            reviewing the risks and benefits, the patient was  deemed in satisfactory condition to undergo the                            procedure.                           After obtaining informed consent, the endoscope was                            passed under direct vision. Throughout the                            procedure, the patient's blood pressure, pulse, and                            oxygen saturations were monitored continuously.  The                            GIF [id number] #[id number] was introduced through the                            mouth, and advanced to the second part of duodenum.                            The upper GI endoscopy was accomplished without                            difficulty. The patient tolerated the procedure                            well. Scope In: Scope Out: Findings:                 The lower third of the esophagus was mildly                            tortuous.                           A small amount of food (residue) was found in the                            gastric body.                           Diffuse mildly erythematous mucosa was found in the                            gastric antrum.                           An acquired benign-appearing, intrinsic moderate                            stenosis was found in the duodenal bulb and was  traversed. A TTS dilator was passed through the                            scope. Dilation with a 15-16.5-18 mm balloon                            dilator was performed to 18 mm. The dilation site                            was examined and showed mild mucosal disruption and                            moderate improvement in luminal narrowing.                           The exam of the duodenum was otherwise normal. Complications:            No immediate complications. Estimated Blood Loss:     Estimated blood loss was minimal. Impression:               - Tortuous esophagus.                           - A small amount of food (residue) in the stomach.                           - Erythematous mucosa in the antrum.                           - Acquired duodenal stenosis (after to be traversed                            with the adult upper endoscope). Dilated with                            balloon to 18 mm.                           - No specimens collected. Recommendation:           - Patient has a contact number available  for                            emergencies. The signs and symptoms of potential                            delayed complications were discussed with the                            patient. Return to normal activities tomorrow.                            Written discharge instructions were provided to the  patient.                           - Resume previous diet.                           - Continue present medications.                           - Resume Xarelto (rivaroxaban) at prior dose                            tomorrow.                           - Repeat upper endoscopy in 2 months for                            retreatment and dilation given significance of this                            stenosis over time. Beverley Fiedler, MD 05/03/2023 2:53:28 PM This report has been signed electronically.

## 2023-05-04 ENCOUNTER — Telehealth: Payer: Self-pay | Admitting: *Deleted

## 2023-05-04 NOTE — Telephone Encounter (Signed)
  Follow up Call-     05/03/2023    1:42 PM  Call back number  Post procedure Call Back phone  # [phone]  Permission to leave phone message Yes    Spoke with wife Patient questions:  Do you have a fever, pain , or abdominal swelling? No. Pain Score  0 *  Have you tolerated food without any problems? Yes.    Have you been able to return to your normal activities? Yes.    Do you have any questions about your discharge instructions: Diet   No. Medications  No. Follow up visit  No.  Do you have questions or concerns about your Care? No.  Actions: * If pain score is 4 or above: No action needed, pain <4.

## 2023-05-08 ENCOUNTER — Other Ambulatory Visit: Payer: Self-pay | Admitting: Physician Assistant

## 2023-07-12 ENCOUNTER — Ambulatory Visit (AMBULATORY_SURGERY_CENTER): Payer: Medicare Other | Admitting: Internal Medicine

## 2023-07-12 ENCOUNTER — Encounter: Payer: Self-pay | Admitting: Internal Medicine

## 2023-07-12 VITALS — BP 104/48 | HR 53 | Temp 97.7°F | Resp 17 | Ht 75.0 in | Wt 236.8 lb

## 2023-07-12 DIAGNOSIS — K315 Obstruction of duodenum: Secondary | ICD-10-CM

## 2023-07-12 MED ORDER — SODIUM CHLORIDE 0.9 % IV SOLN: 500.0000 mL | Freq: Once | INTRAVENOUS | Status: DC

## 2023-07-12 NOTE — Progress Notes (Signed)
Unable to schedule repeat EGD in 3 months as schedule is not ourt for Dr. Rhea Belton. Hold Eliquis for 2 days.

## 2023-07-12 NOTE — Progress Notes (Signed)
Called to room to assist during endoscopic procedure.  Patient ID and intended procedure confirmed with present staff. Received instructions for my participation in the procedure from the performing physician.  

## 2023-07-12 NOTE — Patient Instructions (Addendum)
  Thank you for letting us take care of your healthcare needs today. Resume Eliquis tomorrow and follow per [facility]. Office will reschedule repeat EGD in April.   YOU HAD AN ENDOSCOPIC PROCEDURE TODAY AT THE [facility]:   Refer to the procedure report that was given to you for any specific questions about what was found during the examination.  If the procedure report does not answer your questions, please call your gastroenterologist to clarify.  If you requested that your care partner not be given the details of your procedure findings, then the procedure report has been included in a sealed envelope for you to review at your convenience later.  YOU SHOULD EXPECT: Some feelings of bloating in the abdomen. Passage of more gas than usual.  Walking can help get rid of the air that was put into your GI tract during the procedure and reduce the bloating. If you had a lower endoscopy (such as a colonoscopy or flexible sigmoidoscopy) you may notice spotting of blood in your stool or on the toilet paper. If you underwent a bowel prep for your procedure, you may not have a normal bowel movement for a few days.  Please Note:  You might notice some irritation and congestion in your nose or some drainage.  This is from the oxygen used during your procedure.  There is no need for concern and it should clear up in a day or so.  SYMPTOMS TO REPORT IMMEDIATELY:   Following upper endoscopy (EGD)  Vomiting of blood or coffee ground material  New chest pain or pain under the shoulder blades  Painful or persistently difficult swallowing  New shortness of breath  Fever of 100F or higher  Black, tarry-looking stools  For urgent or emergent issues, a gastroenterologist can be reached at any hour by calling [phone]. Do not use MyChart messaging for urgent concerns.    DIET:  We do recommend a small meal at first, but then you may proceed to your regular diet.  Drink plenty of fluids  but you should avoid alcoholic beverages for 24 hours.  ACTIVITY:  You should plan to take it easy for the rest of today and you should NOT DRIVE or use heavy machinery until tomorrow (because of the sedation medicines used during the test).    FOLLOW UP: Our staff will call the number listed on your records the next business day following your procedure.  We will call around 7:15- 8:00 am to check on you and address any questions or concerns that you may have regarding the information given to you following your procedure. If we do not reach you, we will leave a message.     If any biopsies were taken you will be contacted by phone or by letter within the next 1-3 weeks.  Please call us at [phone] if you have not heard about the biopsies in 3 weeks.    SIGNATURES/CONFIDENTIALITY: You and/or your care partner have signed paperwork which will be entered into your electronic medical record.  These signatures attest to the fact that that the information above on your After Visit Summary has been reviewed and is understood.  Full responsibility of the confidentiality of this discharge information lies with you and/or your care-partner.

## 2023-07-12 NOTE — Op Note (Signed)
[facility] Patient Name: Nathan Cross Procedure Date: 07/12/2023 10:06 AM MRN: [id number] Endoscopist: Beverley Fiedler , MD, [id number] Age: 73 Referring MD:  Date of Birth: [birth year] Gender: Male Account #: [id number] Procedure:                Upper GI endoscopy Indications:              For therapy of duodenal stenosis; multiple previous                            treatment sessions and former use of Axios stent,                            dilation 8 weeks ago to 18 mm with balloon Medicines:                Monitored Anesthesia Care Procedure:                Pre-Anesthesia Assessment:                           - Prior to the procedure, a History and Physical                            was performed, and patient medications and                            allergies were reviewed. The patient's tolerance of                            previous anesthesia was also reviewed. The risks                            and benefits of the procedure and the sedation                            options and risks were discussed with the patient.                            All questions were answered, and informed consent                            was obtained. Prior Anticoagulants: The patient has                            taken Eliquis (apixaban), last dose was 2 days                            prior to procedure. ASA Grade Assessment: III - A                            patient with severe systemic disease. After                            reviewing the risks and benefits, the patient was  deemed in satisfactory condition to undergo the                            procedure.                           After obtaining informed consent, the endoscope was                            passed under direct vision. Throughout the                            procedure, the patient's blood pressure, pulse, and                            oxygen saturations were monitored  continuously. The                            GIF [id number] #[id number] was introduced through the                            mouth, and advanced to the second part of duodenum.                            The upper GI endoscopy was accomplished without                            difficulty. The patient tolerated the procedure                            well. Scope In: Scope Out: Findings:                 The examined esophagus was normal.                           The entire examined stomach was normal.                           An acquired benign-appearing, intrinsic moderate                            stenosis was found in the second portion of the                            duodenum and was traversed. A TTS dilator was                            passed through the scope. Dilation with a                            15-16.5-18 mm pyloric balloon dilator was performed                            to max of 18 mm. The dilation site was examined and  showed mild mucosal disruption and moderate                            improvement in luminal narrowing. Complications:            No immediate complications. Estimated Blood Loss:     Estimated blood loss was minimal. Impression:               - Normal esophagus.                           - Normal stomach.                           - Acquired duodenal stenosis. Dilated to 18 mm.                           - No specimens collected. Recommendation:           - Patient has a contact number available for                            emergencies. The signs and symptoms of potential                            delayed complications were discussed with the                            patient. Return to normal activities tomorrow.                            Written discharge instructions were provided to the                            patient.                           - Resume previous diet.                           - Continue present  medications.                           - Resume Eliquis (apixaban) at prior dose tomorrow.                            Refer to Coumadin Clinic for further adjustment of                            therapy.                           - Repeat upper endoscopy in 3 months for                            retreatment. Hold Eliquis x 48 hours before EGD. Beverley Fiedler, MD 07/12/2023 10:32:53 AM This report has been signed electronically.

## 2023-07-12 NOTE — Progress Notes (Signed)
Pt A/O x 3, gd SR's, pleased with anesthesia, report to RN

## 2023-07-12 NOTE — Progress Notes (Signed)
GASTROENTEROLOGY PROCEDURE H&P NOTE   Primary Care Physician: Vanessa , FNP    Reason for Procedure:  Duodenal stenosis  Plan:    Upper endoscopy with dilation of duodenal stricture  Patient is appropriate for endoscopic procedure(s) in the ambulatory ([facility]) setting.  The nature of the procedure, as well as the risks, benefits, and alternatives were carefully and thoroughly reviewed with the patient. Ample time for discussion and questions allowed. The patient understood, was satisfied, and agreed to proceed.     HPI: Nathan Cross is a 73 y.o. male who presents for EGD and dilation.  Medical history as below.   No recent chest pain or shortness of breath.  No abdominal pain today.  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   Dysphagia 04/2020   Dysrhythmia    Paroxysmal atrial fibrillation    ED (erectile dysfunction)    GERD (gastroesophageal reflux disease)    History of eye prosthesis    Left   Hyperlipidemia    controlled on meds    Hypertension    Hypothyroidism    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)   Myocardial infarction (HCC)    PAF (paroxysmal atrial fibrillation) (HCC)    Sleep apnea    wears cpap    Zenker's diverticulum     Past Surgical History:  Procedure Laterality Date   ANTERIOR LAT LUMBAR FUSION N/A 06/10/2019   Procedure: Lumbar one-two , Lumbar two-three, Lumbar three-four- Anterolateral decompression/fusion with percutaneous pedicle screw fixation;  Surgeon: Maeola Harman, MD;  Location: [facility] OR;  Service: Neurosurgery;  Laterality: N/A;    BALLOON DILATION N/A 05/27/2020   Procedure: BALLOON DILATION;  Surgeon: Meridee Score Netty Starring., MD;  Location: [facility] ENDOSCOPY;  Service: Gastroenterology;  Laterality: N/A;   BALLOON DILATION N/A 06/28/2020   Procedure: BALLOON DILATION;  Surgeon: Meridee Score Netty Starring., MD;  Location: [facility] ENDOSCOPY;  Service: Gastroenterology;  Laterality: N/A;   BALLOON DILATION N/A 09/06/2020   Procedure: BALLOON DILATION;  Surgeon: Meridee Score Netty Starring., MD;  Location: [facility] ENDOSCOPY;  Service: Gastroenterology;  Laterality: N/A;   BIOPSY  06/28/2020   Procedure: BIOPSY;  Surgeon: Meridee Score Netty Starring., MD;  Location: [facility] ENDOSCOPY;  Service: Gastroenterology;;   BIOPSY  12/11/2022   Procedure: BIOPSY;  Surgeon: Lemar Lofty., MD;  Location: [facility] ENDOSCOPY;  Service: Gastroenterology;;   CARDIAC CATHETERIZATION  11/09/2017   COLONOSCOPY     CORONARY ARTERY BYPASS GRAFT  2004   CABG x 4    DUODENAL STENT PLACEMENT N/A 01/02/2023   Procedure: DUODENAL STENT PLACEMENT;  Surgeon: Lemar Lofty., MD;  Location: [facility] ENDOSCOPY;  Service: Gastroenterology;  Laterality: N/A;   ELECTROPHYSIOLOGIC STUDY  05/05/2020   [facility] - ABLATION A-fib pulm vein     ESOPHAGOGASTRODUODENOSCOPY (EGD) WITH PROPOFOL N/A 05/27/2020   Procedure: ESOPHAGOGASTRODUODENOSCOPY (EGD) WITH PROPOFOL;  Surgeon: Meridee Score Netty Starring., MD;  Location: [facility] ENDOSCOPY;  Service: Gastroenterology;  Laterality: N/A;   ESOPHAGOGASTRODUODENOSCOPY (EGD) WITH PROPOFOL N/A 06/28/2020  Procedure: ESOPHAGOGASTRODUODENOSCOPY (EGD) WITH PROPOFOL;  Surgeon: Meridee Score Netty Starring., MD;  Location: [facility] ENDOSCOPY;  Service: Gastroenterology;  Laterality: N/A;  Fluoro    ESOPHAGOGASTRODUODENOSCOPY (EGD) WITH PROPOFOL N/A 09/06/2020   Procedure: ESOPHAGOGASTRODUODENOSCOPY (EGD) WITH PROPOFOL;  Surgeon: Meridee Score Netty Starring., MD;  Location: [facility] ENDOSCOPY;  Service: Gastroenterology;  Laterality: N/A;  Fluoro and ultras slim scope needed.     ESOPHAGOGASTRODUODENOSCOPY (EGD) WITH PROPOFOL N/A 12/11/2022   Procedure: ESOPHAGOGASTRODUODENOSCOPY (EGD) WITH PROPOFOL;  Surgeon: Meridee Score Netty Starring., MD;  Location: [facility] ENDOSCOPY;  Service: Gastroenterology;  Laterality: N/A;   ESOPHAGOGASTRODUODENOSCOPY (EGD) WITH PROPOFOL N/A 01/02/2023   Procedure: ESOPHAGOGASTRODUODENOSCOPY (EGD) WITH PROPOFOL;  Surgeon: Meridee Score Netty Starring., MD;  Location: [facility] ENDOSCOPY;  Service: Gastroenterology;  Laterality: N/A;   ESOPHAGOGASTRODUODENOSCOPY (EGD) WITH PROPOFOL N/A 03/19/2023   Procedure: ESOPHAGOGASTRODUODENOSCOPY (EGD) WITH PROPOFOL;  Surgeon: Meridee Score Netty Starring., MD;  Location: [facility] ENDOSCOPY;  Service: Gastroenterology;  Laterality: N/A;   FOREIGN BODY REMOVAL  05/27/2020   Procedure: FOREIGN BODY REMOVAL;  Surgeon: Meridee Score Netty Starring., MD;  Location: [facility] ENDOSCOPY;  Service: Gastroenterology;;   FOREIGN BODY REMOVAL  06/28/2020   Procedure: FOREIGN BODY REMOVAL;  Surgeon: Meridee Score Netty Starring., MD;  Location: [facility] ENDOSCOPY;  Service: Gastroenterology;;   FOREIGN BODY REMOVAL  09/06/2020   Procedure: FOREIGN BODY REMOVAL;  Surgeon: Lemar Lofty., MD;  Location: [facility] ENDOSCOPY;  Service: Gastroenterology;;   FOREIGN BODY REMOVAL  12/11/2022   Procedure: FOREIGN BODY REMOVAL;  Surgeon: Lemar Lofty., MD;  Location: [facility] ENDOSCOPY;  Service: Gastroenterology;;   HERNIA REPAIR     KENALOG INJECTION  03/19/2023   Procedure: KENALOG INJECTION;  Surgeon: Lemar Lofty., MD;  Location: [facility] ENDOSCOPY;  Service: Gastroenterology;;   KIDNEY DONATION Right 1996   LUMBAR PERCUTANEOUS PEDICLE SCREW 3 LEVEL N/A 06/10/2019   Procedure: LUMBAR PERCUTANEOUS PEDICLE SCREW 3 LEVEL;  Surgeon: Maeola Harman, MD;  Location: [facility] OR;  Service: Neurosurgery;  Laterality: N/A;   MEDIAL PARTIAL KNEE REPLACEMENT Bilateral    POLYPECTOMY     Prosthetic Eye Left    STENT REMOVAL  12/11/2022   Procedure: AXIOS STENT PLACEMENT;  Surgeon: Lemar Lofty., MD;  Location: [facility] ENDOSCOPY;  Service: Gastroenterology;;   Francine Graven REMOVAL  01/02/2023   Procedure: STENT REMOVAL;  Surgeon: Lemar Lofty., MD;  Location: [facility] ENDOSCOPY;  Service: Gastroenterology;;   Francine Graven REMOVAL  03/19/2023   Procedure: STENT REMOVAL;  Surgeon: Lemar Lofty., MD;  Location: [facility] ENDOSCOPY;  Service: Gastroenterology;;   TONSILLECTOMY     as child    UPPER GASTROINTESTINAL ENDOSCOPY      Prior to Admission medications   Medication Sig Start Date End Date Taking? Authorizing Provider  albuterol (VENTOLIN HFA) 108 (90 Base) MCG/ACT inhaler Inhale 2 puffs into the lungs every 6 (six) hours as needed for wheezing or shortness of breath. 11/04/22  Yes [provider]  amiodarone (PACERONE) 200 MG tablet Take 200 mg by mouth daily.   Yes [provider]  aspirin 81 MG chewable tablet Chew 81 mg by mouth daily. Swallow whole.   Yes [provider]  azelastine (ASTELIN) 0.1 % nasal spray INSTILL 2 SPRAYS INTO THE EACH NOSTRIL TWICE A DAY AS NEEDED 03/29/23  Yes [provider]  clonazePAM (KLONOPIN) 0.5 MG tablet Take 0.5 mg by mouth 2 (two) times daily. 04/01/19  Yes [provider]  docusate sodium (COLACE) 50 MG capsule Take 50 mg by mouth daily. Pt states he takes this every night   Yes [provider]  ELIQUIS 5 MG TABS tablet Take 1 tablet by mouth 2 (two) times daily. 05/15/23  Yes [provider]  FARXIGA 10 MG TABS tablet Take 10 mg by mouth daily.   Yes [provider]  ferrous sulfate 325 (65 FE) MG tablet Take 325 mg by mouth daily with breakfast.   Yes [provider]  furosemide (LASIX) 20 MG tablet Take 20 mg by mouth daily.   Yes [provider]  gemfibrozil (LOPID) 600 MG tablet Take 600 mg by mouth daily.   Yes [provider]  icosapent Ethyl (VASCEPA) 1 g capsule Take 1 g by mouth daily.   Yes [provider]  levothyroxine  (SYNTHROID) 50 MCG tablet Take 50 mcg by mouth daily before breakfast.   Yes [provider]  lisinopril (ZESTRIL) 40 MG tablet Take 40 mg by mouth daily.   Yes [provider]  metoprolol succinate (TOPROL-XL) 50 MG 24 hr tablet Take 50 mg by mouth 2 (two) times daily. Take with or immediately following a meal.   Yes [provider]  NON FORMULARY CPAP   Yes [provider]  NON FORMULARY BD NANO 2ND GEN PEN NEEDLE 32 GAUGE X 5/32"   Yes [provider]  omeprazole (PRILOSEC) 40 MG capsule Take 1 capsule (40 mg total) by mouth 2 (two) times daily before a meal. 12/11/22  Yes Mansouraty, Netty Starring., MD  rosuvastatin (CRESTOR) 20 MG tablet Take 20 mg by mouth daily.   Yes [provider]  sucralfate (CARAFATE) 1 g tablet Take 1 tablet (1 g total) by mouth 2 (two) times daily. 01/02/23 01/02/24 Yes Mansouraty, Netty Starring., MD  vitamin C (ASCORBIC ACID) 500 MG tablet Take 500 mg by mouth daily.   Yes [provider]  AMBULATORY NON FORMULARY MEDICATION Medication Name: Diltiazem 2%/Lidocaine 2%   Using your index finger apply a small amount of medication inside the anal opening and to the external anal area twice daily x 6 weeks. 02/09/23   Doree Albee, PA-C  Budeson-Glycopyrrol-Formoterol (BREZTRI AEROSPHERE) 160-9-4.8 MCG/ACT AERO Inhale 2 puffs twice a day by inhalation route for 30 days. 12/15/22   [provider]  dicyclomine (BENTYL) 20 MG tablet TAKE 1 TABLET (20 MG TOTAL) BY MOUTH 3 (THREE) TIMES DAILY AS NEEDED FOR SPASMS (DIARRHEA). 05/08/23   Doree Albee, PA-C  hydrocortisone (ANUSOL-HC) 25 MG suppository Place 1 suppository (25 mg total) rectally 2 (two) times daily. 02/09/23   Doree Albee, PA-C  insulin aspart (NOVOLOG FLEXPEN) 100 UNIT/ML FlexPen Inject 1 sliding scale dose 4 times a day by subcutaneous route as needed for 90 days. 12/15/22   [provider]  nitroGLYCERIN (NITROSTAT) 0.4 MG SL  tablet Place 0.4 mg under the tongue every 5 (five) minutes x 3 doses as needed for chest pain.  Patient not taking: Reported on 07/12/2023 12/01/19   [provider]  NON FORMULARY Nebulizer    [provider]  ondansetron (ZOFRAN-ODT) 8 MG disintegrating tablet TAKE 1 TABLET BY MOUTH EVERY 8 HOURS AS NEEDED FOR NAUSEA OR VOMITING. 03/12/23   Delshon Blanchfield, Carie Caddy, MD  psyllium (REGULOID) 0.52 g capsule Take 1.04 g by mouth daily. Patient not taking: Reported on 02/09/2023    [provider]  Semaglutide,0.25 or 0.5MG /DOS, (OZEMPIC, 0.25 OR 0.5 MG/DOSE,) 2 MG/1.5ML SOPN Inject 0.5 mg every week by subcutaneous route for 120 days. 12/05/22   [provider]  sildenafil (REVATIO) 20 MG tablet Take 20  mg by mouth daily.    [provider]    Current Outpatient Medications  Medication Sig Dispense Refill   albuterol (VENTOLIN HFA) 108 (90 Base) MCG/ACT inhaler Inhale 2 puffs into the lungs every 6 (six) hours as needed for wheezing or shortness of breath.     amiodarone (PACERONE) 200 MG tablet Take 200 mg by mouth daily.     aspirin 81 MG chewable tablet Chew 81 mg by mouth daily. Swallow whole.     azelastine (ASTELIN) 0.1 % nasal spray INSTILL 2 SPRAYS INTO THE EACH NOSTRIL TWICE A DAY AS NEEDED     clonazePAM (KLONOPIN) 0.5 MG tablet Take 0.5 mg by mouth 2 (two) times daily.     docusate sodium (COLACE) 50 MG capsule Take 50 mg by mouth daily. Pt states he takes this every night     ELIQUIS 5 MG TABS tablet Take 1 tablet by mouth 2 (two) times daily.     FARXIGA 10 MG TABS tablet Take 10 mg by mouth daily.     ferrous sulfate 325 (65 FE) MG tablet Take 325 mg by mouth daily with breakfast.     furosemide (LASIX) 20 MG tablet Take 20 mg by mouth daily.     gemfibrozil (LOPID) 600 MG tablet Take 600 mg by mouth daily.     icosapent Ethyl (VASCEPA) 1 g capsule Take 1 g by mouth daily.     levothyroxine (SYNTHROID) 50 MCG tablet Take 50 mcg by mouth daily before  breakfast.     lisinopril (ZESTRIL) 40 MG tablet Take 40 mg by mouth daily.     metoprolol succinate (TOPROL-XL) 50 MG 24 hr tablet Take 50 mg by mouth 2 (two) times daily. Take with or immediately following a meal.     NON FORMULARY CPAP     NON FORMULARY BD NANO 2ND GEN PEN NEEDLE 32 GAUGE X 5/32"     omeprazole (PRILOSEC) 40 MG capsule Take 1 capsule (40 mg total) by mouth 2 (two) times daily before a meal. 60 capsule 6   rosuvastatin (CRESTOR) 20 MG tablet Take 20 mg by mouth daily.     sucralfate (CARAFATE) 1 g tablet Take 1 tablet (1 g total) by mouth 2 (two) times daily. 60 tablet 11   vitamin C (ASCORBIC ACID) 500 MG tablet Take 500 mg by mouth daily.     AMBULATORY NON FORMULARY MEDICATION Medication Name: Diltiazem 2%/Lidocaine 2%   Using your index finger apply a small amount of medication inside the anal opening and to the external anal area twice daily x 6 weeks. 30 g 1   Budeson-Glycopyrrol-Formoterol (BREZTRI AEROSPHERE) 160-9-4.8 MCG/ACT AERO Inhale 2 puffs twice a day by inhalation route for 30 days.     dicyclomine (BENTYL) 20 MG tablet TAKE 1 TABLET (20 MG TOTAL) BY MOUTH 3 (THREE) TIMES DAILY AS NEEDED FOR SPASMS (DIARRHEA). 270 tablet 1   hydrocortisone (ANUSOL-HC) 25 MG suppository Place 1 suppository (25 mg total) rectally 2 (two) times daily. 12 suppository 0   insulin aspart (NOVOLOG FLEXPEN) 100 UNIT/ML FlexPen Inject 1 sliding scale dose 4 times a day by subcutaneous route as needed for 90 days.     nitroGLYCERIN (NITROSTAT) 0.4 MG SL tablet Place 0.4 mg under the tongue every 5 (five) minutes x 3 doses as needed for chest pain.  (Patient not taking: Reported on 07/12/2023)     NON FORMULARY Nebulizer     ondansetron (ZOFRAN-ODT) 8 MG disintegrating tablet TAKE 1 TABLET BY  MOUTH EVERY 8 HOURS AS NEEDED FOR NAUSEA OR VOMITING. 30 tablet 1   psyllium (REGULOID) 0.52 g capsule Take 1.04 g by mouth daily. (Patient not taking: Reported on 02/09/2023)     Semaglutide,0.25 or  0.5MG /DOS, (OZEMPIC, 0.25 OR 0.5 MG/DOSE,) 2 MG/1.5ML SOPN Inject 0.5 mg every week by subcutaneous route for 120 days.     sildenafil (REVATIO) 20 MG tablet Take 20 mg by mouth daily.     Current Facility-Administered Medications  Medication Dose Route Frequency Provider Last Rate Last Admin   0.9 %  sodium chloride infusion  500 mL Intravenous Once Danyka Merlin, Carie Caddy, MD        Allergies as of 07/12/2023   (No Known Allergies)    Family History  Problem Relation Age of Onset   Colon cancer Neg Hx    Stomach cancer Neg Hx    Pancreatic cancer Neg Hx    Esophageal cancer Neg Hx    Colon polyps Neg Hx    Rectal cancer Neg Hx     Social History   Socioeconomic History   Marital status: Married    Spouse name: Stanton Kidney   Number of children: 3   Years of education: Not on file   Highest education level: Not on file  Occupational History   Not on file  Tobacco Use   Smoking status: Some Days    Current packs/day: 0.00    Types: Cigarettes    Last attempt to quit: 02/25/2020    Years since quitting: 3.3   Smokeless tobacco: Never  Vaping Use   Vaping status: Never Used  Substance and Sexual Activity   Alcohol use: Yes    Comment: occassionally   Drug use: Never   Sexual activity: Yes  Other Topics Concern   Not on file  Social History Narrative   Not on file   Social Drivers of Health   Financial Resource Strain: Not on file  Food Insecurity: Not on file  Transportation Needs: Not on file  Physical Activity: Not on file  Stress: Not on file  Social Connections: Not on file  Intimate Partner Violence: Not on file    Physical Exam: Vital signs in last 24 hours: @BP  (!) 137/50   Pulse (!) 56   Temp 97.7 F (36.5 C) (Temporal)   Ht 6\' 3"  (1.905 m)   Wt 236 lb 12.8 oz (107.4 kg)   SpO2 94%   BMI 29.60 kg/m  GEN: NAD EYE: Sclerae anicteric ENT: MMM CV: Non-tachycardic Pulm: CTA b/l GI: Soft, NT/ND NEURO:  Alert & Oriented x 3   Erick Blinks, MD [facility]  07/12/2023 10:01 AM

## 2023-07-13 ENCOUNTER — Telehealth: Payer: Self-pay | Admitting: *Deleted

## 2023-07-13 NOTE — Telephone Encounter (Signed)
  Follow up Call-     07/12/2023    9:32 AM 05/03/2023    1:42 PM  Call back number  Post procedure Call Back phone  # [phone]  Permission to leave phone message Yes Yes     Patient questions:  Do you have a fever, pain , or abdominal swelling? No. Pain Score  0 *  Have you tolerated food without any problems? Yes.    Have you been able to return to your normal activities? Yes.    Do you have any questions about your discharge instructions: Diet   No. Medications  No. Follow up visit  No.  Do you have questions or concerns about your Care? No.  Actions: * If pain score is 4 or above: No action needed, pain <4.

## 2023-09-30 ENCOUNTER — Other Ambulatory Visit: Payer: Self-pay | Admitting: Gastroenterology

## 2023-10-03 ENCOUNTER — Encounter: Payer: Self-pay | Admitting: Internal Medicine

## 2023-10-18 ENCOUNTER — Ambulatory Visit (AMBULATORY_SURGERY_CENTER)

## 2023-10-18 VITALS — Ht 75.0 in | Wt 245.0 lb

## 2023-10-18 DIAGNOSIS — K315 Obstruction of duodenum: Secondary | ICD-10-CM

## 2023-10-18 NOTE — Progress Notes (Signed)
 Pre visit completed via phone call; Patient verified name, DOB, and address; No egg or soy allergy known to patient  No issues known to pt with past sedation with any surgeries or procedures; Patient denies ever being told they had issues or difficulty with intubation; No FH of Malignant Hyperthermia Pt is not on diet pills Pt is not on home 02  Pt is not on blood thinners  Pt denies issues with constipation currently;  AFIB dx  Have any cardiac testing pending--NO Insurance verified during PV appt--- Medicare Pt can ambulate without assistance;  Pt denies use of chewing tobacco Discussed diabetic/weight loss medication holds; Discussed NSAID holds; Checked BMI to be less than 50; Pt instructed to use [employer] or GoodRx for a price reduction on prep  Patient's chart reviewed by Rogena Class CNRA prior to previsit and patient appropriate for the [facility].  Pre visit completed and red dot placed by patient's name on their procedure day (on provider's schedule).    Instructions printed and mailed to the patient per his request;

## 2023-11-01 ENCOUNTER — Ambulatory Visit: Admitting: Internal Medicine

## 2023-11-01 ENCOUNTER — Encounter: Payer: Self-pay | Admitting: Internal Medicine

## 2023-11-01 VITALS — BP 125/68 | HR 54 | Temp 98.2°F | Resp 12 | Ht 75.0 in | Wt 245.0 lb

## 2023-11-01 DIAGNOSIS — K315 Obstruction of duodenum: Secondary | ICD-10-CM

## 2023-11-01 MED ORDER — SODIUM CHLORIDE 0.9 % IV SOLN: 500.0000 mL | Freq: Once | INTRAVENOUS | Status: DC

## 2023-11-01 MED ORDER — ONDANSETRON 8 MG PO TBDP: 8.0000 mg | ORAL_TABLET | Freq: Three times a day (TID) | ORAL | 5 refills | Status: AC | PRN

## 2023-11-01 NOTE — Progress Notes (Signed)
 GASTROENTEROLOGY PROCEDURE H&P NOTE   Primary Care Physician: Susana Enter, FNP    Reason for Procedure:   Dilation of duodenal stenosis  Plan:    EGD  Patient is appropriate for endoscopic procedure(s) in the ambulatory ([facility]) setting.  The nature of the procedure, as well as the risks, benefits, and alternatives were carefully and thoroughly reviewed with the patient. Ample time for discussion and questions allowed. The patient understood, was satisfied, and agreed to proceed.     HPI: Nathan Cross is a 73 y.o. male who presents for EGD with dilaton of duodenal stricture.  Medical history as below.  No recent chest pain or shortness of breath.  No abdominal pain today.  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   Dysphagia 04/2020   Dysrhythmia    Paroxysmal atrial fibrillation    ED (erectile dysfunction)    GERD (gastroesophageal reflux disease)    History of eye prosthesis    Left   Hyperlipidemia    controlled on meds    Hypertension    Hypothyroidism    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)   Myocardial infarction (HCC)    PAF (paroxysmal atrial fibrillation) (HCC)    Sleep apnea    wears cpap    Zenker's diverticulum     Past Surgical History:  Procedure Laterality Date   ANTERIOR LAT LUMBAR FUSION N/A 06/10/2019   Procedure: Lumbar one-two , Lumbar two-three, Lumbar three-four- Anterolateral decompression/fusion with percutaneous pedicle screw fixation;  Surgeon: Manya Sells, MD;  Location: [facility] OR;  Service: Neurosurgery;  Laterality: N/A;   BALLOON DILATION N/A 05/27/2020    Procedure: BALLOON DILATION;  Surgeon: Brice Campi Albino Alu., MD;  Location: [facility] ENDOSCOPY;  Service: Gastroenterology;  Laterality: N/A;   BALLOON DILATION N/A 06/28/2020   Procedure: BALLOON DILATION;  Surgeon: Brice Campi Albino Alu., MD;  Location: [facility] ENDOSCOPY;  Service: Gastroenterology;  Laterality: N/A;   BALLOON DILATION N/A 09/06/2020   Procedure: BALLOON DILATION;  Surgeon: Brice Campi Albino Alu., MD;  Location: [facility] ENDOSCOPY;  Service: Gastroenterology;  Laterality: N/A;   BIOPSY  06/28/2020   Procedure: BIOPSY;  Surgeon: Brice Campi Albino Alu., MD;  Location: [facility] ENDOSCOPY;  Service: Gastroenterology;;   BIOPSY  12/11/2022   Procedure: BIOPSY;  Surgeon: Normie Becton., MD;  Location: [facility] ENDOSCOPY;  Service: Gastroenterology;;   CARDIAC CATHETERIZATION  11/09/2017   COLONOSCOPY     CORONARY ARTERY BYPASS GRAFT  2004   CABG x 4    DUODENAL STENT PLACEMENT N/A 01/02/2023   Procedure: DUODENAL STENT PLACEMENT;  Surgeon: Normie Becton., MD;  Location: [facility] ENDOSCOPY;  Service: Gastroenterology;  Laterality: N/A;   ELECTROPHYSIOLOGIC STUDY  05/05/2020   [facility] - ABLATION A-fib pulm vein     ESOPHAGOGASTRODUODENOSCOPY (EGD) WITH PROPOFOL  N/A 05/27/2020   Procedure: ESOPHAGOGASTRODUODENOSCOPY (EGD) WITH PROPOFOL ;  Surgeon: Brice Campi Albino Alu., MD;  Location: [facility] ENDOSCOPY;  Service: Gastroenterology;  Laterality: N/A;   ESOPHAGOGASTRODUODENOSCOPY (EGD) WITH PROPOFOL  N/A 06/28/2020   Procedure: ESOPHAGOGASTRODUODENOSCOPY (EGD) WITH PROPOFOL ;  Surgeon: Brice Campi,  Albino Alu., MD;  Location: [facility] ENDOSCOPY;  Service: Gastroenterology;  Laterality: N/A;  Fluoro    ESOPHAGOGASTRODUODENOSCOPY (EGD) WITH PROPOFOL  N/A 09/06/2020   Procedure: ESOPHAGOGASTRODUODENOSCOPY (EGD) WITH PROPOFOL ;  Surgeon: Brice Campi Albino Alu., MD;  Location: [facility] ENDOSCOPY;  Service: Gastroenterology;  Laterality: N/A;  Fluoro and ultras slim scope needed.    ESOPHAGOGASTRODUODENOSCOPY (EGD) WITH PROPOFOL  N/A  12/11/2022   Procedure: ESOPHAGOGASTRODUODENOSCOPY (EGD) WITH PROPOFOL ;  Surgeon: Brice Campi Albino Alu., MD;  Location: [facility] ENDOSCOPY;  Service: Gastroenterology;  Laterality: N/A;   ESOPHAGOGASTRODUODENOSCOPY (EGD) WITH PROPOFOL  N/A 01/02/2023   Procedure: ESOPHAGOGASTRODUODENOSCOPY (EGD) WITH PROPOFOL ;  Surgeon: Brice Campi Albino Alu., MD;  Location: [facility] ENDOSCOPY;  Service: Gastroenterology;  Laterality: N/A;   ESOPHAGOGASTRODUODENOSCOPY (EGD) WITH PROPOFOL  N/A 03/19/2023   Procedure: ESOPHAGOGASTRODUODENOSCOPY (EGD) WITH PROPOFOL ;  Surgeon: Brice Campi Albino Alu., MD;  Location: [facility] ENDOSCOPY;  Service: Gastroenterology;  Laterality: N/A;   FOREIGN BODY REMOVAL  05/27/2020   Procedure: FOREIGN BODY REMOVAL;  Surgeon: Brice Campi Albino Alu., MD;  Location: [facility] ENDOSCOPY;  Service: Gastroenterology;;   FOREIGN BODY REMOVAL  06/28/2020   Procedure: FOREIGN BODY REMOVAL;  Surgeon: Brice Campi Albino Alu., MD;  Location: [facility] ENDOSCOPY;  Service: Gastroenterology;;   FOREIGN BODY REMOVAL  09/06/2020   Procedure: FOREIGN BODY REMOVAL;  Surgeon: Normie Becton., MD;  Location: [facility] ENDOSCOPY;  Service: Gastroenterology;;   FOREIGN BODY REMOVAL  12/11/2022   Procedure: FOREIGN BODY REMOVAL;  Surgeon: Normie Becton., MD;  Location: [facility] ENDOSCOPY;  Service: Gastroenterology;;   HERNIA REPAIR     KENALOG  INJECTION  03/19/2023   Procedure: KENALOG  INJECTION;  Surgeon: Normie Becton., MD;  Location: [facility] ENDOSCOPY;  Service: Gastroenterology;;   KIDNEY DONATION Right 1996   LUMBAR PERCUTANEOUS PEDICLE SCREW 3 LEVEL N/A 06/10/2019   Procedure: LUMBAR PERCUTANEOUS PEDICLE SCREW 3 LEVEL;  Surgeon: Manya Sells, MD;  Location: [facility] OR;  Service: Neurosurgery;  Laterality: N/A;   MEDIAL PARTIAL KNEE REPLACEMENT Bilateral    POLYPECTOMY     Prosthetic Eye Left    STENT REMOVAL  12/11/2022   Procedure: AXIOS STENT PLACEMENT;  Surgeon: Normie Becton., MD;  Location: [facility] ENDOSCOPY;  Service:  Gastroenterology;;   Y[place]onne Herald REMOVAL  01/02/2023   Procedure: STENT REMOVAL;  Surgeon: Normie Becton., MD;  Location: [facility] ENDOSCOPY;  Service: Gastroenterology;;   Yuvonne Herald REMOVAL  03/19/2023   Procedure: STENT REMOVAL;  Surgeon: Normie Becton., MD;  Location: [facility] ENDOSCOPY;  Service: Gastroenterology;;   TONSILLECTOMY     as child    UPPER GASTROINTESTINAL ENDOSCOPY      Prior to Admission medications   Medication Sig Start Date End Date Taking? Authorizing Provider  amiodarone  (PACERONE ) 200 MG tablet Take 200 mg by mouth daily.   Yes [provider]  aspirin 81 MG chewable tablet Chew 81 mg by mouth daily. Swallow whole.   Yes [provider]  azelastine (ASTELIN) 0.1 % nasal spray INSTILL 2 SPRAYS INTO THE EACH NOSTRIL TWICE A DAY AS NEEDED 03/29/23  Yes [provider]  Cholecalciferol (VITAMIN D-3 PO) Take 1 tablet by mouth daily at 6 (six) AM.   Yes [provider]  clonazePAM  (KLONOPIN ) 0.5 MG tablet Take 0.5 mg by mouth 2 (two) times daily. 04/01/19  Yes [provider]  dicyclomine  (BENTYL ) 20 MG tablet TAKE 1 TABLET (20 MG TOTAL) BY MOUTH 3 (THREE) TIMES DAILY AS NEEDED FOR SPASMS (DIARRHEA). 05/08/23  Yes Edmonia Gottron, PA-C  docusate sodium  (COLACE) 50 MG capsule Take 50 mg by mouth daily. Pt  states he takes this every night   Yes [provider]  FARXIGA 10 MG TABS tablet Take 10 mg by mouth daily.   Yes [provider]  ferrous sulfate 325 (65 FE) MG tablet Take 325 mg by mouth daily with breakfast.   Yes [provider]  furosemide  (LASIX ) 20 MG tablet Take 20 mg by mouth daily.   Yes [provider]  gemfibrozil  (LOPID ) 600 MG tablet Take 600 mg by mouth daily.   Yes [provider]  icosapent  Ethyl (VASCEPA ) 1 g capsule Take 1 g by mouth daily.   Yes [provider]  levothyroxine  (SYNTHROID ) 50 MCG tablet Take 50 mcg by mouth daily before breakfast.   Yes [provider]  lisinopril  (ZESTRIL ) 40 MG tablet Take 40 mg by mouth daily.   Yes [provider]  metoprolol  succinate (TOPROL -XL) 50 MG 24 hr tablet Take 50 mg by mouth 2 (two) times daily. Take with or immediately following a meal.   Yes [provider]  NON FORMULARY CPAP   Yes [provider]  omeprazole  (PRILOSEC) 40 MG capsule TAKE 1 CAPSULE (40 MG TOTAL) BY MOUTH 2 (TWO) TIMES DAILY BEFORE A MEAL. 10/01/23  Yes Kennon Encinas, Amber Bail, MD  rosuvastatin  (CRESTOR ) 20 MG tablet Take 20 mg by mouth daily.   Yes [provider]  sucralfate  (CARAFATE ) 1 g tablet Take 1 tablet (1 g total) by mouth 2 (two) times daily. 01/02/23 01/02/24 Yes Mansouraty, Albino Alu., MD  vitamin C (ASCORBIC ACID ) 500 MG tablet Take 500 mg by mouth daily.   Yes [provider]  albuterol (VENTOLIN HFA) 108 (90 Base) MCG/ACT inhaler Inhale 2 puffs into the lungs every 6 (six) hours as needed for wheezing or shortness of breath. 11/04/22   [provider]  AMBULATORY NON FORMULARY MEDICATION Medication Name: Diltiazem 2%/Lidocaine  2%   Using your index finger apply a small amount of medication inside the anal opening and to the external anal area twice daily x 6 weeks. 02/09/23   Edmonia Gottron, PA-C  Budeson-Glycopyrrol-Formoterol (BREZTRI AEROSPHERE) 160-9-4.8 MCG/ACT AERO Inhale 2 puffs twice a day by inhalation route for 30 days. Patient not taking: Reported on 11/01/2023 12/15/22   [provider]  cyclobenzaprine (FLEXERIL) 10 MG tablet Take 10 mg by mouth 3 (three) times daily as needed. Patient not taking: Reported on 11/01/2023 07/24/23   [provider]  ELIQUIS 5 MG TABS tablet Take 1 tablet by mouth 2 (two) times daily. 05/15/23   [provider]  hydrocortisone  (ANUSOL -HC) 25 MG suppository Place 1 suppository (25 mg total) rectally 2 (two) times daily. 02/09/23   Edmonia Gottron, PA-C  insulin aspart (NOVOLOG FLEXPEN) 100 UNIT/ML FlexPen Inject  1 sliding scale dose 4 times a day by subcutaneous route as needed for 90 days. 12/15/22   [provider]  nitroGLYCERIN (NITROSTAT) 0.4 MG SL tablet Place 0.4 mg under the tongue every 5 (five) minutes x 3 doses as needed for chest pain. 12/01/19   [provider]  NON FORMULARY Nebulizer    [provider]  NON FORMULARY BD NANO 2ND GEN PEN NEEDLE 32 GAUGE X 5/32"    [provider]  ondansetron  (ZOFRAN -ODT) 8 MG disintegrating tablet TAKE 1 TABLET BY MOUTH EVERY 8 HOURS AS NEEDED FOR NAUSEA OR VOMITING. 03/12/23   Orrie Schubert, Amber Bail, MD  Semaglutide,0.25 or 0.5MG /DOS, (OZEMPIC, 0.25 OR 0.5 MG/DOSE,) 2 MG/1.5ML SOPN Inject 0.5 mg every week by subcutaneous route  for 120 days. 12/05/22   [provider]  sildenafil (REVATIO) 20 MG tablet Take 20 mg by mouth daily.    [provider]  traMADol (ULTRAM) 50 MG tablet Take 50 mg by mouth every 6 (six) hours as needed. 07/24/23   [provider]    Current Outpatient Medications  Medication Sig Dispense Refill   amiodarone  (PACERONE ) 200 MG tablet Take 200 mg by mouth daily.     aspirin 81 MG chewable tablet Chew 81 mg by mouth daily. Swallow whole.     azelastine (ASTELIN) 0.1 % nasal spray INSTILL 2 SPRAYS INTO THE EACH NOSTRIL TWICE A DAY AS NEEDED     Cholecalciferol (VITAMIN D-3 PO) Take 1 tablet by mouth daily at 6 (six) AM.     clonazePAM  (KLONOPIN ) 0.5 MG tablet Take 0.5 mg by mouth 2 (two) times daily.     dicyclomine  (BENTYL ) 20 MG tablet TAKE 1 TABLET (20 MG TOTAL) BY MOUTH 3 (THREE) TIMES DAILY AS NEEDED FOR SPASMS (DIARRHEA). 270 tablet 1   docusate sodium  (COLACE) 50 MG capsule Take 50 mg by mouth daily. Pt states he takes this every night     FARXIGA 10 MG TABS tablet Take 10 mg by mouth daily.     ferrous sulfate 325 (65 FE) MG tablet Take 325 mg by mouth daily with breakfast.     furosemide  (LASIX ) 20 MG tablet Take 20 mg by mouth daily.     gemfibrozil  (LOPID ) 600 MG tablet Take  600 mg by mouth daily.     icosapent  Ethyl (VASCEPA ) 1 g capsule Take 1 g by mouth daily.     levothyroxine  (SYNTHROID ) 50 MCG tablet Take 50 mcg by mouth daily before breakfast.     lisinopril  (ZESTRIL ) 40 MG tablet Take 40 mg by mouth daily.     metoprolol  succinate (TOPROL -XL) 50 MG 24 hr tablet Take 50 mg by mouth 2 (two) times daily. Take with or immediately following a meal.     NON FORMULARY CPAP     omeprazole  (PRILOSEC) 40 MG capsule TAKE 1 CAPSULE (40 MG TOTAL) BY MOUTH 2 (TWO) TIMES DAILY BEFORE A MEAL. 180 capsule 2   rosuvastatin  (CRESTOR ) 20 MG tablet Take 20 mg by mouth daily.     sucralfate  (CARAFATE ) 1 g tablet Take 1 tablet (1 g total) by mouth 2 (two) times daily. 60 tablet 11   vitamin C (ASCORBIC ACID ) 500 MG tablet Take 500 mg by mouth daily.     albuterol (VENTOLIN HFA) 108 (90 Base) MCG/ACT inhaler Inhale 2 puffs into the lungs every 6 (six) hours as needed for wheezing or shortness of breath.     AMBULATORY NON FORMULARY MEDICATION Medication Name: Diltiazem 2%/Lidocaine  2%   Using your index finger apply a small amount of medication inside the anal opening and to the external anal area twice daily x 6 weeks. 30 g 1   Budeson-Glycopyrrol-Formoterol (BREZTRI AEROSPHERE) 160-9-4.8 MCG/ACT AERO Inhale 2 puffs twice a day by inhalation route for 30 days. (Patient not taking: Reported on 11/01/2023)     cyclobenzaprine (FLEXERIL) 10 MG tablet Take 10 mg by mouth 3 (three) times daily as needed. (Patient not taking: Reported on 11/01/2023)     ELIQUIS 5 MG TABS tablet Take 1 tablet by mouth 2 (two) times daily.     hydrocortisone  (ANUSOL -HC) 25 MG suppository Place 1 suppository (25 mg total) rectally 2 (two) times daily. 12 suppository 0   insulin aspart (NOVOLOG FLEXPEN) 100 UNIT/ML  FlexPen Inject 1 sliding scale dose 4 times a day by subcutaneous route as needed for 90 days.     nitroGLYCERIN (NITROSTAT) 0.4 MG SL tablet Place 0.4 mg under the tongue every 5 (five) minutes x 3  doses as needed for chest pain.     NON FORMULARY Nebulizer     NON FORMULARY BD NANO 2ND GEN PEN NEEDLE 32 GAUGE X 5/32"     ondansetron  (ZOFRAN -ODT) 8 MG disintegrating tablet TAKE 1 TABLET BY MOUTH EVERY 8 HOURS AS NEEDED FOR NAUSEA OR VOMITING. 30 tablet 1   Semaglutide,0.25 or 0.5MG /DOS, (OZEMPIC, 0.25 OR 0.5 MG/DOSE,) 2 MG/1.5ML SOPN Inject 0.5 mg every week by subcutaneous route for 120 days.     sildenafil (REVATIO) 20 MG tablet Take 20 mg by mouth daily.     traMADol (ULTRAM) 50 MG tablet Take 50 mg by mouth every 6 (six) hours as needed.     Current Facility-Administered Medications  Medication Dose Route Frequency Provider Last Rate Last Admin   0.9 %  sodium chloride  infusion  500 mL Intravenous Once Kolsen Choe, Amber Bail, MD        Allergies as of 11/01/2023   (No Known Allergies)    Family History  Problem Relation Age of Onset   Colon cancer Neg Hx    Stomach cancer Neg Hx    Pancreatic cancer Neg Hx    Esophageal cancer Neg Hx    Colon polyps Neg Hx    Rectal cancer Neg Hx     Social History   Socioeconomic History   Marital status: Married    Spouse name: Abran Abrahams   Number of children: 3   Years of education: Not on file   Highest education level: Not on file  Occupational History   Not on file  Tobacco Use   Smoking status: Some Days    Current packs/day: 0.00    Types: Cigarettes    Last attempt to quit: 02/25/2020    Years since quitting: 3.6   Smokeless tobacco: Never  Vaping Use   Vaping status: Never Used  Substance and Sexual Activity   Alcohol use: Yes    Comment: occassionally   Drug use: Never   Sexual activity: Yes  Other Topics Concern   Not on file  Social History Narrative   Not on file   Social Drivers of Health   Financial Resource Strain: Not on file  Food Insecurity: Not on file  Transportation Needs: Not on file  Physical Activity: Not on file  Stress: Not on file  Social Connections: Not on file  Intimate Partner Violence: Not on  file    Physical Exam: Vital signs in last 24 hours: @BP  119/68   Pulse 62   Temp 98.2 F (36.8 C) (Temporal)   Ht 6\' 3"  (1.905 m)   Wt 245 lb (111.1 kg)   SpO2 97%   BMI 30.62 kg/m  GEN: NAD EYE: Sclerae anicteric ENT: MMM CV: Non-tachycardic Pulm: CTA b/l GI: Soft, NT/ND NEURO:  Alert & Oriented x 3   Laurell Pond, MD [facility]  11/01/2023 1:34 PM

## 2023-11-01 NOTE — Progress Notes (Signed)
 To pacu, VSS. Report to [email or web address]

## 2023-11-01 NOTE — Patient Instructions (Addendum)
 Resume previous diet.  Continue present medications.  Repeat upper endoscopy in 6 months for retreatment.  Resume Eliquis (apixaban) at prior dose tomorrow. Refer to managing physician for further adjustment of therapy.   YOU HAD AN ENDOSCOPIC PROCEDURE TODAY AT THE [facility]:   Refer to the procedure report that was given to you for any specific questions about what was found during the examination.  If the procedure report does not answer your questions, please call your gastroenterologist to clarify.  If you requested that your care partner not be given the details of your procedure findings, then the procedure report has been included in a sealed envelope for you to review at your convenience later.  YOU SHOULD EXPECT: Some feelings of bloating in the abdomen. Passage of more gas than usual.  Walking can help get rid of the air that was put into your GI tract during the procedure and reduce the bloating. If you had a lower endoscopy (such as a colonoscopy or flexible sigmoidoscopy) you may notice spotting of blood in your stool or on the toilet paper. If you underwent a bowel prep for your procedure, you may not have a normal bowel movement for a few days.  Please Note:  You might notice some irritation and congestion in your nose or some drainage.  This is from the oxygen used during your procedure.  There is no need for concern and it should clear up in a day or so.  SYMPTOMS TO REPORT IMMEDIATELY:  Following upper endoscopy (EGD)  Vomiting of blood or coffee ground material  New chest pain or pain under the shoulder blades  Painful or persistently difficult swallowing  New shortness of breath  Fever of 100F or higher  Black, tarry-looking stools  For urgent or emergent issues, a gastroenterologist can be reached at any hour by calling [phone]. Do not use MyChart messaging for urgent concerns.    DIET:  We do recommend a small meal at first, but then you may  proceed to your regular diet.  Drink plenty of fluids but you should avoid alcoholic beverages for 24 hours.  ACTIVITY:  You should plan to take it easy for the rest of today and you should NOT DRIVE or use heavy machinery until tomorrow (because of the sedation medicines used during the test).    FOLLOW UP: Our staff will call the number listed on your records the next business day following your procedure.  We will call around 7:15- 8:00 am to check on you and address any questions or concerns that you may have regarding the information given to you following your procedure. If we do not reach you, we will leave a message.     If any biopsies were taken you will be contacted by phone or by letter within the next 1-3 weeks.  Please call us  at [phone] if you have not heard about the biopsies in 3 weeks.    SIGNATURES/CONFIDENTIALITY: You and/or your care partner have signed paperwork which will be entered into your electronic medical record.  These signatures attest to the fact that that the information above on your After Visit Summary has been reviewed and is understood.  Full responsibility of the confidentiality of this discharge information lies with you and/or your care-partner.

## 2023-11-01 NOTE — Progress Notes (Signed)
 Pt's states no medical or surgical changes since previsit or office visit.

## 2023-11-01 NOTE — Progress Notes (Signed)
 Called to room to assist during endoscopic procedure.  Patient ID and intended procedure confirmed with present staff. Received instructions for my participation in the procedure from the performing physician.

## 2023-11-01 NOTE — Op Note (Signed)
 [facility] Patient Name: Nathan Cross Procedure Date: 11/01/2023 1:39 PM MRN: [id number] Endoscopist: Nannette Babe , MD, [id number] Age: 73 Referring MD:  Date of Birth: [birth year] Gender: Male Account #: [id number] Procedure:                Upper GI endoscopy Indications:              For therapy of duodenal stenosis Medicines:                Monitored Anesthesia Care Procedure:                Pre-Anesthesia Assessment:                           - Prior to the procedure, a History and Physical                            was performed, and patient medications and                            allergies were reviewed. The patient's tolerance of                            previous anesthesia was also reviewed. The risks                            and benefits of the procedure and the sedation                            options and risks were discussed with the patient.                            All questions were answered, and informed consent                            was obtained. Prior Anticoagulants: The patient has                            taken Eliquis (apixaban), last dose was 2 days                            prior to procedure. ASA Grade Assessment: III - A                            patient with severe systemic disease. After                            reviewing the risks and benefits, the patient was                            deemed in satisfactory condition to undergo the                            procedure.  After obtaining informed consent, the endoscope was                            passed under direct vision. Throughout the                            procedure, the patient's blood pressure, pulse, and                            oxygen saturations were monitored continuously. The                            Olympus scope [id number] was introduced through the                            mouth, and advanced to the second part of duodenum.                             The upper GI endoscopy was accomplished without                            difficulty. The patient tolerated the procedure                            well. Scope In: Scope Out: Findings:                 The examined esophagus was normal.                           The entire examined stomach was normal.                           An acquired benign-appearing, intrinsic moderate                            stenosis was found in the second portion of the                            duodenum and was traversed. A TTS dilator was                            passed through the scope. Dilation with a                            15-16.5-18 mm pyloric balloon dilator was                            performed. The dilation site was examined and                            showed moderate mucosal disruption. Complications:            No immediate complications. Estimated Blood Loss:     Estimated blood loss was minimal. Impression:               -  Normal esophagus.                           - Normal stomach.                           - Acquired duodenal stenosis. Dilated.                           - No specimens collected. Recommendation:           - Patient has a contact number available for                            emergencies. The signs and symptoms of potential                            delayed complications were discussed with the                            patient. Return to normal activities tomorrow.                            Written discharge instructions were provided to the                            patient.                           - Resume previous diet.                           - Continue present medications.                           - Repeat upper endoscopy in 6 months for                            retreatment.                           - Can perform surveillance colonoscopy for hx of                            adenomatous polyps on the same day as next  EGD.                           - Resume Eliquis (apixaban) at prior dose tomorrow.                            Refer to managing physician for further adjustment                            of therapy. Nannette Babe, MD 11/01/2023 2:05:39 PM This report has been signed electronically.

## 2023-11-02 ENCOUNTER — Telehealth: Payer: Self-pay

## 2023-11-02 NOTE — Telephone Encounter (Signed)
  Follow up Call-     11/01/2023   12:44 PM 07/12/2023    9:32 AM 05/03/2023    1:42 PM  Call back number  Post procedure Call Back phone  # [phone]  Permission to leave phone message Yes Yes Yes    Patient wife answered the phone, stating patient was still sleeping. She stated that he is "doing wonderful" and has been able to eat and drink without any difficulty.    Patient questions:  Do you have a fever, pain , or abdominal swelling? No. Pain Score  0 *  Have you tolerated food without any problems? Yes.    Have you been able to return to your normal activities? Yes.    Do you have any questions about your discharge instructions: Diet   No. Medications  No. Follow up visit  No.  Do you have questions or concerns about your Care? No.  Actions: * If pain score is 4 or above: No action needed, pain <4.

## 2024-01-03 ENCOUNTER — Other Ambulatory Visit: Payer: Self-pay | Admitting: Gastroenterology

## 2024-01-15 ENCOUNTER — Other Ambulatory Visit: Payer: Self-pay | Admitting: Physician Assistant

## 2024-01-19 ENCOUNTER — Other Ambulatory Visit: Payer: Self-pay | Admitting: Gastroenterology

## 2024-01-21 ENCOUNTER — Other Ambulatory Visit: Payer: Self-pay | Admitting: Gastroenterology

## 2024-01-21 ENCOUNTER — Telehealth: Payer: Self-pay | Admitting: Gastroenterology

## 2024-01-21 MED ORDER — SUCRALFATE 1 G PO TABS: 1.0000 g | ORAL_TABLET | Freq: Two times a day (BID) | ORAL | 1 refills | Status: DC

## 2024-01-21 NOTE — Telephone Encounter (Signed)
 Refill sent under Dr. Albertus as he is the patients primary GI MD.

## 2024-01-21 NOTE — Telephone Encounter (Signed)
 Refill sent by other means. Rx also written under Dr. Albertus as he is the patients primary GI MD.

## 2024-01-21 NOTE — Telephone Encounter (Signed)
 Patient called and stated that he is needing a refill on his carafate  1 G tablet. Patient is requesting that we send it to [facility]. Please advise.

## 2024-01-21 NOTE — Telephone Encounter (Signed)
Patient calling in regards to previous note. Please advise.

## 2024-04-03 ENCOUNTER — Telehealth: Payer: Self-pay | Admitting: *Deleted

## 2024-04-03 ENCOUNTER — Telehealth: Payer: Self-pay | Admitting: Internal Medicine

## 2024-04-03 NOTE — Telephone Encounter (Signed)
 Inbound call from patient stating he would like to schedule endo/colon, patient has recall but wanted to make sure he can be scheduled for double procedure or if he needed to be seen in office first.  Please advise  Thank you

## 2024-04-03 NOTE — Telephone Encounter (Signed)
 Contacted patient.  Appointment for Endo/colon made 05/15/2024 and appt for telephone PV for 11/07. Request for anticoagulation hold faxed to Dr Mearl Gene.

## 2024-04-03 NOTE — Telephone Encounter (Signed)
 Hi- May patient be direct double or does he need to be seen due to Eliquis?   Know he's had several back-to-backs , but been a while since  last OV.  Thank you.

## 2024-04-03 NOTE — Telephone Encounter (Signed)
 Okay thank you

## 2024-04-03 NOTE — Telephone Encounter (Signed)
 If we get permission to hold Eliquis from prescribing provider procedure can proceed without office visit

## 2024-04-05 ENCOUNTER — Other Ambulatory Visit: Payer: Self-pay | Admitting: Internal Medicine

## 2024-04-29 NOTE — Telephone Encounter (Signed)
 Note was originally routed electronically to Dr Mearl Gene on 04/03/24. Will resend.  I have also left a voicemail for patient to call back to ensure he is still taking Eliquis and to confirm he does receive this from Dr Zakhary (per med reconciliation tool in EPIC, patient has not gotten Eliquis fill since 10/2023 and no records from [facility] can be found).

## 2024-04-29 NOTE — Telephone Encounter (Signed)
 Please follow up concerning ELIQUIS hold for this patient as his PV is scheduled on 11/07.  And should there be a cardiac clearance obtained on this patient? Please/thank you Bre, PV RN

## 2024-05-01 NOTE — Telephone Encounter (Signed)
 Patient called back. He states that he does still take Eliquis, however, he now sees Dr Brian Zagol with [facility] in [place], [place] (phone [phone], fax [phone]).  I reached out to Dr Jannell office to request they send an eliquis clearance request but they asked that we fax request to the fax number above.   Updated Eliquis clearance request has been faxed to electronically to Dr Zagol at [phone].

## 2024-05-01 NOTE — Telephone Encounter (Signed)
 Left additional voicemail for patient to call back

## 2024-05-02 ENCOUNTER — Ambulatory Visit: Admitting: *Deleted

## 2024-05-02 ENCOUNTER — Telehealth: Payer: Self-pay | Admitting: *Deleted

## 2024-05-02 VITALS — Ht 75.0 in | Wt 242.0 lb

## 2024-05-02 DIAGNOSIS — Z1211 Encounter for screening for malignant neoplasm of colon: Secondary | ICD-10-CM

## 2024-05-02 MED ORDER — NA SULFATE-K SULFATE-MG SULF 17.5-3.13-1.6 GM/177ML PO SOLN: 1.0000 | Freq: Once | ORAL | 0 refills | Status: AC

## 2024-05-02 NOTE — Progress Notes (Signed)
 Pt's name and DOB verified at the beginning of the pre-visit with 2 identifiers  Pt denies any difficulty with ambulating,sitting, laying down or rolling side to side  Pt has no issues moving head neck or swallowing  No egg or soy allergy known to patient   No issues known to pt with past sedation  No FH of Malignant Hyperthermia  Pt is not on home 02   Pt is not on blood thinners   Pt has frequent issues with constipation RN instructed pt to use Miralax  per bottles instructions a week before prep days. Pt states they will  Pt is not on dialysis  Pt hx Afib  Pt denies any upcoming cardiac testing  Patient's chart reviewed by Norleen Schillings CNRA prior to pre-visit and patient appropriate for the [facility].  Pre-visit completed and red dot placed by patient's name on their procedure day (on provider's schedule).    Visit by phone  Pt states weight is 242 lb  Pt instructed to call MD who orders Eliquis and ask for the hold times.  Pt states he will so it can Get the ball rolling TE was sent  prior PV to Pod A for follow up.   Pt given  both [facility] main # and MD on call # prior to instructions.  Informed pt to come in at the time discussed and is shown on PV instructions.  Pt instructed to use Single[employer] or GoodRx for a price reduction on prep   Copy of instructions  to be sent in mail and address read back to pt to verify correct on envelope. Instructed pt on all aspects of written instructions including med holds clothing to wear and foods to eat and not eat as well as after procedure legal restrictions and to call MD on call if needed.. Pt states understanding. Instructed pt to review instructions again prior to procedure and call main # given if has any questions or any issues. Pt states they will.

## 2024-05-02 NOTE — Telephone Encounter (Signed)
 Patient called and stated he spoke with Dr. Jannell office and stated they have sent over clearance yesterday 11/6. Please advise, thank you

## 2024-05-02 NOTE — Telephone Encounter (Signed)
 Instructed pt during PV to contact prescribing MD for Eliquis to clarify hold times and maybe help facilitate order to be sent to us .

## 2024-05-02 NOTE — Telephone Encounter (Signed)
 After 2 id verification spoke with wife who states that pt did call MD office of doctor who has ordered his blood thinner and he was told the information should be sent to office. Instructed wife since she stated he waS not given a hold time length that she aware of for pt to keep up with this and follow MD orders regarding hold times. She states she will.Nathan Cross

## 2024-05-02 NOTE — Telephone Encounter (Signed)
 No. See phone notes from 04/03/24 phone call.  Still awaiting Dr Zagol's response

## 2024-05-05 NOTE — Telephone Encounter (Signed)
 Left message for patient to call back

## 2024-05-05 NOTE — Telephone Encounter (Signed)
 Inbound call from patient requesting to speak to [facility]. Patient is requesting a call back.Patient stated that he received a call from Dottie to discuss his procedure coming up.  Please advise.

## 2024-05-05 NOTE — Telephone Encounter (Signed)
 I have actually now located a scanned note under media tab in EPIC dated 05/01/24. This note states:  Dear Drs. Of [facility],  This is to inform you that the above mentioned patient has moderately good exercise tolerance and no symptoms of angina. He had an adenosine Cardiolite stress test performed in 04/2019 which showed no ischemia. He had an echocardiogram performed 11/2022 which showed EF 60% and no significant valvular disease. I feel this patient is low risk from cardiac standpoint to undergo colonoscopy and upper endoscopy. This patient may stop the Eliquis 48 hours prior to the procedure and resume it 24 hours after the procedure. Aspirin therapy should not be interrupted.  Please do not hesitate to call me if I could be of further assistance.  Sincerely,   Brian Zagol, MD

## 2024-05-05 NOTE — Telephone Encounter (Signed)
 Attempted to reach Dr Zagol's office x 2. Was unable to reach a live person. I did leave a voicemail for Dr Jannell nurse requesting return correspondence regarding anticoagulation clearance to our fax at [phone] or [phone] [facility] fax).

## 2024-05-06 NOTE — Telephone Encounter (Signed)
 I have spoken to patient to advise that per Dr Zagol, he may hold eliquis 48 hours prior to his upcoming procedure. Patient verbalizes understanding and relays this information back to me. He is advised that he may continue baby aspirin for his procedure.

## 2024-05-15 ENCOUNTER — Ambulatory Visit: Admitting: Internal Medicine

## 2024-05-15 ENCOUNTER — Other Ambulatory Visit: Payer: Self-pay

## 2024-05-15 ENCOUNTER — Encounter: Payer: Self-pay | Admitting: Internal Medicine

## 2024-05-15 VITALS — BP 80/26 | HR 53 | Temp 98.1°F | Resp 17 | Ht 75.0 in | Wt 242.0 lb

## 2024-05-15 DIAGNOSIS — K635 Polyp of colon: Secondary | ICD-10-CM

## 2024-05-15 DIAGNOSIS — Z1211 Encounter for screening for malignant neoplasm of colon: Secondary | ICD-10-CM

## 2024-05-15 DIAGNOSIS — Z860100 Personal history of colon polyps, unspecified: Secondary | ICD-10-CM

## 2024-05-15 DIAGNOSIS — D125 Benign neoplasm of sigmoid colon: Secondary | ICD-10-CM

## 2024-05-15 DIAGNOSIS — R194 Change in bowel habit: Secondary | ICD-10-CM

## 2024-05-15 DIAGNOSIS — K315 Obstruction of duodenum: Secondary | ICD-10-CM

## 2024-05-15 DIAGNOSIS — D123 Benign neoplasm of transverse colon: Secondary | ICD-10-CM | POA: Diagnosis not present

## 2024-05-15 DIAGNOSIS — K648 Other hemorrhoids: Secondary | ICD-10-CM

## 2024-05-15 DIAGNOSIS — D120 Benign neoplasm of cecum: Secondary | ICD-10-CM | POA: Diagnosis not present

## 2024-05-15 DIAGNOSIS — K5900 Constipation, unspecified: Secondary | ICD-10-CM

## 2024-05-15 DIAGNOSIS — K5730 Diverticulosis of large intestine without perforation or abscess without bleeding: Secondary | ICD-10-CM

## 2024-05-15 MED ORDER — HYDROCORTISONE ACETATE 25 MG RE SUPP: 25.0000 mg | Freq: Two times a day (BID) | RECTAL | 2 refills | Status: AC | PRN

## 2024-05-15 MED ORDER — SODIUM CHLORIDE 0.9 % IV SOLN: 500.0000 mL | Freq: Once | INTRAVENOUS | Status: DC

## 2024-05-15 NOTE — Patient Instructions (Signed)
YOU HAD AN ENDOSCOPIC PROCEDURE TODAY AT [facility]:   Refer to the procedure report that was given to you for any specific questions about what was found during the examination.  If the procedure report does not answer your questions, please call your gastroenterologist to clarify.  If you requested that your care partner not be given the details of your procedure findings, then the procedure report has been included in a sealed envelope for you to review at your convenience later.  **Handouts given on polyps, diverticulosis and hemorrhoids**  YOU SHOULD EXPECT: Some feelings of bloating in the abdomen. Passage of more gas than usual.  Walking can help get rid of the air that was put into your GI tract during the procedure and reduce the bloating. If you had a lower endoscopy (such as a colonoscopy or flexible sigmoidoscopy) you may notice spotting of blood in your stool or on the toilet paper. If you underwent a bowel prep for your procedure, you may not have a normal bowel movement for a few days.  Please Note:  You might notice some irritation and congestion in your nose or some drainage.  This is from the oxygen used during your procedure.  There is no need for concern and it should clear up in a day or so.  SYMPTOMS TO REPORT IMMEDIATELY:  Following lower endoscopy (colonoscopy or flexible sigmoidoscopy):  Excessive amounts of blood in the stool  Significant tenderness or worsening of abdominal pains  Swelling of the abdomen that is new, acute  Fever of 100F or higher  Following upper endoscopy (EGD)  Vomiting of blood or coffee ground material  New chest pain or pain under the shoulder blades  Painful or persistently difficult swallowing  New shortness of breath  Fever of 100F or higher  Black, tarry-looking stools  For urgent or emergent issues, a gastroenterologist can be reached at any hour by calling [phone]. Do not use MyChart messaging for urgent  concerns.    DIET:  We do recommend a small meal at first, but then you may proceed to your regular diet.  Drink plenty of fluids but you should avoid alcoholic beverages for 24 hours.  ACTIVITY:  You should plan to take it easy for the rest of today and you should NOT DRIVE or use heavy machinery until tomorrow (because of the sedation medicines used during the test).    FOLLOW UP: Our staff will call the number listed on your records the next business day following your procedure.  We will call around 7:15- 8:00 am to check on you and address any questions or concerns that you may have regarding the information given to you following your procedure. If we do not reach you, we will leave a message.     If any biopsies were taken you will be contacted by phone or by letter within the next 1-3 weeks.  Please call us at [phone] if you have not heard about the biopsies in 3 weeks.    SIGNATURES/CONFIDENTIALITY: You and/or your care partner have signed paperwork which will be entered into your electronic medical record.  These signatures attest to the fact that that the information above on your After Visit Summary has been reviewed and is understood.  Full responsibility of the confidentiality of this discharge information lies with you and/or your care-partner.

## 2024-05-15 NOTE — Progress Notes (Signed)
 Pt's states no medical or surgical changes since previsit or office visit.

## 2024-05-15 NOTE — Op Note (Signed)
 [facility] Patient Name: Nathan Cross Procedure Date: 05/15/2024 2:27 PM MRN: [id number] Endoscopist: Gordy CHRISTELLA Starch , MD, [id number] Age: 73 Referring MD:  Date of Birth: [birth year] Gender: Male Account #: [id number] Procedure:                Colonoscopy Indications:              High risk colon cancer surveillance: Personal                            history of colonic polyps Medicines:                Monitored Anesthesia Care Procedure:                Pre-Anesthesia Assessment:                           - Prior to the procedure, a History and Physical                            was performed, and patient medications and                            allergies were reviewed. The patient's tolerance of                            previous anesthesia was also reviewed. The risks                            and benefits of the procedure and the sedation                            options and risks were discussed with the patient.                            All questions were answered, and informed consent                            was obtained. Prior Anticoagulants: The patient has                            taken Eliquis (apixaban), last dose was 2 days                            prior to procedure. ASA Grade Assessment: III - A                            patient with severe systemic disease. After                            reviewing the risks and benefits, the patient was                            deemed in satisfactory condition to undergo the  procedure.                           After obtaining informed consent, the colonoscope                            was passed under direct vision. Throughout the                            procedure, the patient's blood pressure, pulse, and                            oxygen saturations were monitored continuously. The                            CF [id number] #[id number] was introduced through the anus                             and advanced to the cecum, identified by                            appendiceal orifice and ileocecal valve. The                            colonoscopy was performed without difficulty. The                            patient tolerated the procedure well. The quality                            of the bowel preparation was good. The ileocecal                            valve, appendiceal orifice, and rectum were                            photographed. Scope In: 2:45:08 PM Scope Out: 3:00:00 PM Scope Withdrawal Time: 0 hours 12 minutes 16 seconds  Total Procedure Duration: 0 hours 14 minutes 52 seconds  Findings:                 The digital rectal exam was normal.                           Two sessile polyps were found in the cecum. The                            polyps were 4 to 5 mm in size. These polyps were                            removed with a cold snare. Resection and retrieval                            were complete.  Two sessile polyps were found in the transverse                            colon. The polyps were 4 to 6 mm in size. These                            polyps were removed with a cold snare. Resection                            and retrieval were complete.                           Two sessile polyps were found in the splenic                            flexure. The polyps were 2 to 4 mm in size. These                            polyps were removed with a cold snare. Resection                            and retrieval were complete.                           Multiple medium-mouthed and small-mouthed                            diverticula were found in the sigmoid colon,                            descending colon and ascending colon.                           Internal hemorrhoids were found during                            retroflexion. The hemorrhoids were medium-sized. Complications:            No immediate  complications. Estimated Blood Loss:     Estimated blood loss was minimal. Impression:               - Two 4 to 5 mm polyps in the cecum, removed with a                            cold snare. Resected and retrieved.                           - Two 4 to 6 mm polyps in the transverse colon,                            removed with a cold snare. Resected and retrieved.                           - Two 2 to 4  mm polyps at the splenic flexure,                            removed with a cold snare. Resected and retrieved.                           - Moderate diverticulosis in the sigmoid colon, in                            the descending colon and in the ascending colon.                           - Internal hemorrhoids. Recommendation:           - Patient has a contact number available for                            emergencies. The signs and symptoms of potential                            delayed complications were discussed with the                            patient. Return to normal activities tomorrow.                            Written discharge instructions were provided to the                            patient.                           - Resume previous diet.                           - Continue present medications.                           - Resume Eliquis (apixaban) at prior dose tomorrow.                            Refer to managing physician for further adjustment                            of therapy.                           - Await pathology results.                           - Repeat colonoscopy is recommended for                            surveillance. The colonoscopy date will be                            determined  after pathology results from today's                            exam become available for review. Gordy CHRISTELLA Starch, MD 05/15/2024 3:19:00 PM This report has been signed electronically.

## 2024-05-15 NOTE — Progress Notes (Signed)
 Report to PACU, RN, vss, BBS= Clear.

## 2024-05-15 NOTE — Progress Notes (Signed)
 Called to room to assist during endoscopic procedure.  Patient ID and intended procedure confirmed with present staff. Received instructions for my participation in the procedure from the performing physician.

## 2024-05-15 NOTE — Progress Notes (Signed)
 GASTROENTEROLOGY PROCEDURE H&P NOTE   Primary Care Physician: Paola Greig CROME, FNP    Reason for Procedure:  Dilation of known duodenal stricture, history of colonic polyps  Plan:    EGD with dilation of duodenal stricture and colonoscopy for surveillance  Patient is appropriate for endoscopic procedure(s) in the ambulatory ([facility]) setting.  The nature of the procedure, as well as the risks, benefits, and alternatives were carefully and thoroughly reviewed with the patient. Ample time for discussion and questions allowed.  All questions were answered. The patient understood, was satisfied, and agreed with the plan to proceed.    HPI: Nathan Cross is a 73 y.o. male who presents for EGD and colonoscopy.  Medical history as below.  Tolerated the prep.  No recent chest pain or shortness of breath.  No abdominal pain today.  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   Dysphagia 04/2020   Dysrhythmia    Paroxysmal atrial fibrillation    ED (erectile dysfunction)    GERD (gastroesophageal reflux disease)    History of eye prosthesis    Left   Hyperlipidemia    controlled on meds    Hypertension    Hypothyroidism    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)   Myocardial infarction (HCC)    PAF (paroxysmal atrial fibrillation) (HCC)    Sleep apnea    wears cpap    Zenker's diverticulum     Past Surgical History:  Procedure Laterality Date   ANTERIOR LAT LUMBAR FUSION N/A 06/10/2019   Procedure: Lumbar one-two , Lumbar two-three, Lumbar three-four- Anterolateral  decompression/fusion with percutaneous pedicle screw fixation;  Surgeon: Unice Pac, MD;  Location: [facility] OR;  Service: Neurosurgery;  Laterality: N/A;   BALLOON DILATION N/A 05/27/2020   Procedure: BALLOON DILATION;  Surgeon: Wilhelmenia Aloha Raddle., MD;  Location: [facility] ENDOSCOPY;  Service: Gastroenterology;  Laterality: N/A;   BALLOON DILATION N/A 06/28/2020   Procedure: BALLOON DILATION;  Surgeon: Wilhelmenia Aloha Raddle., MD;  Location: [facility] ENDOSCOPY;  Service: Gastroenterology;  Laterality: N/A;   BALLOON DILATION N/A 09/06/2020   Procedure: BALLOON DILATION;  Surgeon: Wilhelmenia Aloha Raddle., MD;  Location: [facility] ENDOSCOPY;  Service: Gastroenterology;  Laterality: N/A;   BIOPSY  06/28/2020   Procedure: BIOPSY;  Surgeon: Wilhelmenia Aloha Raddle., MD;  Location: [facility]COPY;  Service: Gastroenterology;;   BIOPSY  12/11/2022   Procedure: BIOPSY;  Surgeon: Wilhelmenia Aloha Raddle., MD;  Location: [facility] ENDOSCOPY;  Service: Gastroenterology;;   CARDIAC CATHETERIZATION  11/09/2017   COLONOSCOPY     CORONARY ARTERY BYPASS GRAFT  2004   CABG x 4    DUODENAL STENT PLACEMENT N/A 01/02/2023   Procedure: DUODENAL STENT PLACEMENT;  Surgeon: Wilhelmenia Aloha Raddle., MD;  Location: [facility] ENDOSCOPY;  Service: Gastroenterology;  Laterality: N/A;   ELECTROPHYSIOLOGIC STUDY  05/05/2020   [facility] - ABLATION A-fib pulm vein     ESOPHAGOGASTRODUODENOSCOPY (EGD) WITH PROPOFOL  N/A 05/27/2020   Procedure: ESOPHAGOGASTRODUODENOSCOPY (EGD) WITH PROPOFOL ;  Surgeon: Wilhelmenia Aloha Raddle., MD;  Location: [facility] ENDOSCOPY;  Service: Gastroenterology;  Laterality: N/A;   ESOPHAGOGASTRODUODENOSCOPY (EGD) WITH PROPOFOL  N/A 06/28/2020   Procedure: ESOPHAGOGASTRODUODENOSCOPY (EGD) WITH PROPOFOL ;  Surgeon: Wilhelmenia Aloha Raddle., MD;  Location: [facility] ENDOSCOPY;  Service: Gastroenterology;  Laterality: N/A;  Fluoro    ESOPHAGOGASTRODUODENOSCOPY (EGD) WITH PROPOFOL  N/A 09/06/2020   Procedure: ESOPHAGOGASTRODUODENOSCOPY (EGD) WITH PROPOFOL ;  Surgeon:  Wilhelmenia Aloha Raddle., MD;  Location: [facility] ENDOSCOPY;  Service: Gastroenterology;  Laterality: N/A;  Fluoro and ultras slim scope needed.    ESOPHAGOGASTRODUODENOSCOPY (EGD) WITH PROPOFOL  N/A 12/11/2022   Procedure: ESOPHAGOGASTRODUODENOSCOPY (EGD) WITH PROPOFOL ;  Surgeon: Wilhelmenia Aloha Raddle., MD;  Location: [facility] ENDOSCOPY;  Service: Gastroenterology;  Laterality: N/A;   ESOPHAGOGASTRODUODENOSCOPY (EGD) WITH PROPOFOL  N/A 01/02/2023   Procedure: ESOPHAGOGASTRODUODENOSCOPY (EGD) WITH PROPOFOL ;  Surgeon: Wilhelmenia Aloha Raddle., MD;  Location: [facility] ENDOSCOPY;  Service: Gastroenterology;  Laterality: N/A;   ESOPHAGOGASTRODUODENOSCOPY (EGD) WITH PROPOFOL  N/A 03/19/2023   Procedure: ESOPHAGOGASTRODUODENOSCOPY (EGD) WITH PROPOFOL ;  Surgeon: Wilhelmenia Aloha Raddle., MD;  Location: [facility] ENDOSCOPY;  Service: Gastroenterology;  Laterality: N/A;   FOREIGN BODY REMOVAL  05/27/2020   Procedure: FOREIGN BODY REMOVAL;  Surgeon: Wilhelmenia Aloha Raddle., MD;  Location: [facility] ENDOSCOPY;  Service: Gastroenterology;;   FOREIGN BODY REMOVAL  06/28/2020   Procedure: FOREIGN BODY REMOVAL;  Surgeon: Wilhelmenia Aloha Raddle., MD;  Location: [facility] ENDOSCOPY;  Service: Gastroenterology;;   FOREIGN BODY REMOVAL  09/06/2020   Procedure: FOREIGN BODY REMOVAL;  Surgeon: Wilhelmenia Aloha Raddle., MD;  Location: [facility] ENDOSCOPY;  Service: Gastroenterology;;   FOREIGN BODY REMOVAL  12/11/2022   Procedure: FOREIGN BODY REMOVAL;  Surgeon: Wilhelmenia Aloha Raddle., MD;  Location: [facility] ENDOSCOPY;  Service: Gastroenterology;;   HERNIA REPAIR     KENALOG  INJECTION  03/19/2023   Procedure: KENALOG  INJECTION;  Surgeon: Wilhelmenia Aloha Raddle., MD;  Location: [facility] ENDOSCOPY;  Service: Gastroenterology;;   KIDNEY DONATION Right 1996   LUMBAR PERCUTANEOUS PEDICLE SCREW 3 LEVEL N/A 06/10/2019   Procedure: LUMBAR PERCUTANEOUS PEDICLE SCREW 3 LEVEL;  Surgeon: Unice Pac, MD;  Location: [facility] OR;  Service: Neurosurgery;  Laterality: N/A;   MEDIAL PARTIAL KNEE REPLACEMENT  Bilateral    POLYPECTOMY     Prosthetic Eye Left    STENT REMOVAL  12/11/2022   Procedure: AXIOS STENT PLACEMENT;  Surgeon: Wilhelmenia Aloha Raddle., MD;  Location: [facility]OSCOPY;  Service: Gastroenterology;;   CLEDA REMOVAL  01/02/2023   Procedure: STENT REMOVAL;  Surgeon: Wilhelmenia Aloha Raddle., MD;  Location: [facility]OSCOPY;  Service: Gastroenterology;;   CLEDA REMOVAL  03/19/2023   Procedure: STENT REMOVAL;  Surgeon: Wilhelmenia Aloha Raddle., MD;  Location: [facility]OSCOPY;  Service: Gastroenterology;;   TONSILLECTOMY     as child    UPPER GASTROINTESTINAL ENDOSCOPY      Prior to Admission medications   Medication Sig Start Date End Date Taking? Authorizing Provider  AMBULATORY NON FORMULARY MEDICATION Medication Name: Diltiazem 2%/Lidocaine  2%   Using your index finger apply a small amount of medication inside the anal opening and to the external anal area twice daily x 6 weeks. 02/09/23  Yes Craig Alan SAUNDERS, PA-C  dicyclomine  (BENTYL ) 20 MG tablet TAKE 1 TABLET (20 MG TOTAL) BY MOUTH 3 (THREE) TIMES DAILY AS NEEDED FOR SPASMS (DIARRHEA). 01/15/24  Yes Craig Alan R, PA-C  ferrous sulfate 325 (65 FE) MG tablet Take 325 mg by mouth daily with breakfast.   Yes [provider]  lisinopril  (ZESTRIL ) 40 MG tablet Take 40 mg by mouth daily.   Yes [provider]  metoprolol  succinate (TOPROL -XL) 50 MG 24 hr  tablet Take 50 mg by mouth 2 (two) times daily. Take with or immediately following a meal.   Yes [provider]  NON FORMULARY CPAP   Yes [provider]  NON FORMULARY Nebulizer   Yes [provider]  albuterol (VENTOLIN HFA) 108 (90 Base) MCG/ACT inhaler Inhale 2 puffs into the lungs every 6 (six) hours as needed for wheezing or shortness of breath. 11/04/22   [provider]  amiodarone  (PACERONE ) 200 MG tablet Take 200 mg by mouth daily.    [provider]  aspirin 81 MG chewable tablet Chew 81 mg by mouth daily. Swallow whole.     [provider]  azelastine (ASTELIN) 0.1 % nasal spray INSTILL 2 SPRAYS INTO THE EACH NOSTRIL TWICE A DAY AS NEEDED Patient taking differently: as needed. 03/29/23   [provider]  Budeson-Glycopyrrol-Formoterol (BREZTRI AEROSPHERE) 160-9-4.8 MCG/ACT AERO Inhale 2 puffs twice a day by inhalation route for 30 days. 12/15/22   [provider]  Cholecalciferol (VITAMIN D-3 PO) Take 1 tablet by mouth daily at 6 (six) AM.    [provider]  clonazePAM  (KLONOPIN ) 0.5 MG tablet Take 0.5 mg by mouth 2 (two) times daily. 04/01/19   [provider]  cyclobenzaprine (FLEXERIL) 10 MG tablet Take 10 mg by mouth 3 (three) times daily as needed. Patient not taking: Reported on 11/01/2023 07/24/23   [provider]  docusate sodium  (COLACE) 50 MG capsule Take 50 mg by mouth daily. Pt states he takes this every night    [provider]  ELIQUIS 5 MG TABS tablet Take 1 tablet by mouth 2 (two) times daily. 05/15/23   [provider]  FARXIGA 10 MG TABS tablet Take 10 mg by mouth daily.    [provider]  furosemide  (LASIX ) 20 MG tablet Take 20 mg by mouth daily.    [provider]  gemfibrozil  (LOPID ) 600 MG tablet Take 600 mg by mouth daily.    [provider]  hydrocortisone  (ANUSOL -HC) 25 MG suppository Place 1 suppository (25 mg total) rectally 2 (two) times daily. Patient taking differently: Place 25 mg rectally as needed. 02/09/23   Craig Alan SAUNDERS, PA-C  icosapent  Ethyl (VASCEPA ) 1 g capsule Take 1 g by mouth daily.    [provider]  insulin aspart (NOVOLOG FLEXPEN) 100 UNIT/ML FlexPen Inject 1 sliding scale dose 4 times a day by subcutaneous route as needed for 90 days. 12/15/22   [provider]  levothyroxine  (SYNTHROID ) 50 MCG tablet Take 50 mcg by mouth daily before breakfast.    [provider]  nitroGLYCERIN (NITROSTAT) 0.4 MG SL tablet Place 0.4 mg under the tongue every 5  (five) minutes x 3 doses as needed for chest pain. 12/01/19   [provider]  NON FORMULARY BD NANO 2ND GEN PEN NEEDLE 32 GAUGE X 5/32    [provider]  omeprazole  (PRILOSEC) 40 MG capsule TAKE 1 CAPSULE (40 MG TOTAL) BY MOUTH 2 (TWO) TIMES DAILY BEFORE A MEAL. 10/01/23   Zaccheaus Storlie, Gordy HERO, MD  ondansetron  (ZOFRAN -ODT) 8 MG disintegrating tablet Take 1 tablet (8 mg total) by mouth every 8 (eight) hours as needed for nausea or vomiting. 11/01/23   Remedios Mckone, Gordy HERO, MD  rosuvastatin  (CRESTOR ) 20 MG tablet Take 20 mg by mouth daily.    [provider]  Semaglutide,0.25 or 0.5MG /DOS, (OZEMPIC, 0.25 OR 0.5 MG/DOSE,) 2 MG/1.5ML SOPN Inject 0.5 mg every week by subcutaneous route for 120 days. 12/05/22  [provider]  sildenafil (REVATIO) 20 MG tablet Take 20 mg by mouth daily.    [provider]  sucralfate  (CARAFATE ) 1 g tablet TAKE 1 TABLET BY MOUTH 2 TIMES DAILY. 04/07/24   Dorsie Sethi, Gordy HERO, MD  traMADol (ULTRAM) 50 MG tablet Take 50 mg by mouth every 6 (six) hours as needed. Patient not taking: Reported on 05/02/2024 07/24/23   [provider]  vitamin C (ASCORBIC ACID ) 500 MG tablet Take 500 mg by mouth daily.    [provider]    Current Outpatient Medications  Medication Sig Dispense Refill   AMBULATORY NON FORMULARY MEDICATION Medication Name: Diltiazem 2%/Lidocaine  2%   Using your index finger apply a small amount of medication inside the anal opening and to the external anal area twice daily x 6 weeks. 30 g 1   dicyclomine  (BENTYL ) 20 MG tablet TAKE 1 TABLET (20 MG TOTAL) BY MOUTH 3 (THREE) TIMES DAILY AS NEEDED FOR SPASMS (DIARRHEA). 270 tablet 1   ferrous sulfate 325 (65 FE) MG tablet Take 325 mg by mouth daily with breakfast.     lisinopril  (ZESTRIL ) 40 MG tablet Take 40 mg by mouth daily.     metoprolol  succinate (TOPROL -XL) 50 MG 24 hr tablet Take 50 mg by mouth 2 (two) times daily. Take with or immediately following a meal.     NON  FORMULARY CPAP     NON FORMULARY Nebulizer     albuterol (VENTOLIN HFA) 108 (90 Base) MCG/ACT inhaler Inhale 2 puffs into the lungs every 6 (six) hours as needed for wheezing or shortness of breath.     amiodarone  (PACERONE ) 200 MG tablet Take 200 mg by mouth daily.     aspirin 81 MG chewable tablet Chew 81 mg by mouth daily. Swallow whole.     azelastine (ASTELIN) 0.1 % nasal spray INSTILL 2 SPRAYS INTO THE EACH NOSTRIL TWICE A DAY AS NEEDED (Patient taking differently: as needed.)     Budeson-Glycopyrrol-Formoterol (BREZTRI AEROSPHERE) 160-9-4.8 MCG/ACT AERO Inhale 2 puffs twice a day by inhalation route for 30 days.     Cholecalciferol (VITAMIN D-3 PO) Take 1 tablet by mouth daily at 6 (six) AM.     clonazePAM  (KLONOPIN ) 0.5 MG tablet Take 0.5 mg by mouth 2 (two) times daily.     cyclobenzaprine (FLEXERIL) 10 MG tablet Take 10 mg by mouth 3 (three) times daily as needed. (Patient not taking: Reported on 11/01/2023)     docusate sodium  (COLACE) 50 MG capsule Take 50 mg by mouth daily. Pt states he takes this every night     ELIQUIS 5 MG TABS tablet Take 1 tablet by mouth 2 (two) times daily.     FARXIGA 10 MG TABS tablet Take 10 mg by mouth daily.     furosemide  (LASIX ) 20 MG tablet Take 20 mg by mouth daily.     gemfibrozil  (LOPID ) 600 MG tablet Take 600 mg by mouth daily.     hydrocortisone  (ANUSOL -HC) 25 MG suppository Place 1 suppository (25 mg total) rectally 2 (two) times daily. (Patient taking differently: Place 25 mg rectally as needed.) 12 suppository 0   icosapent  Ethyl (VASCEPA ) 1 g capsule Take 1 g by mouth daily.     insulin aspart (NOVOLOG FLEXPEN) 100 UNIT/ML FlexPen Inject 1 sliding scale dose 4 times a day by subcutaneous route as needed for 90 days.     levothyroxine  (SYNTHROID ) 50 MCG tablet Take 50 mcg by mouth daily before breakfast.     nitroGLYCERIN (  NITROSTAT) 0.4 MG SL tablet Place 0.4 mg under the tongue every 5 (five) minutes x 3 doses as needed for chest pain.     NON  FORMULARY BD NANO 2ND GEN PEN NEEDLE 32 GAUGE X 5/32     omeprazole  (PRILOSEC) 40 MG capsule TAKE 1 CAPSULE (40 MG TOTAL) BY MOUTH 2 (TWO) TIMES DAILY BEFORE A MEAL. 180 capsule 2   ondansetron  (ZOFRAN -ODT) 8 MG disintegrating tablet Take 1 tablet (8 mg total) by mouth every 8 (eight) hours as needed for nausea or vomiting. 30 tablet 5   rosuvastatin  (CRESTOR ) 20 MG tablet Take 20 mg by mouth daily.     Semaglutide,0.25 or 0.5MG /DOS, (OZEMPIC, 0.25 OR 0.5 MG/DOSE,) 2 MG/1.5ML SOPN Inject 0.5 mg every week by subcutaneous route for 120 days.     sildenafil (REVATIO) 20 MG tablet Take 20 mg by mouth daily.     sucralfate  (CARAFATE ) 1 g tablet TAKE 1 TABLET BY MOUTH 2 TIMES DAILY. 60 tablet 1   traMADol (ULTRAM) 50 MG tablet Take 50 mg by mouth every 6 (six) hours as needed. (Patient not taking: Reported on 05/02/2024)     vitamin C (ASCORBIC ACID ) 500 MG tablet Take 500 mg by mouth daily.     Current Facility-Administered Medications  Medication Dose Route Frequency Provider Last Rate Last Admin   0.9 %  sodium chloride  infusion  500 mL Intravenous Once Auston Halfmann, Gordy HERO, MD        Allergies as of 05/15/2024   (No Known Allergies)    Family History  Problem Relation Age of Onset   Colon cancer Neg Hx    Stomach cancer Neg Hx    Pancreatic cancer Neg Hx    Esophageal cancer Neg Hx    Colon polyps Neg Hx    Rectal cancer Neg Hx     Social History   Socioeconomic History   Marital status: Married    Spouse name: Adrien   Number of children: 3   Years of education: Not on file   Highest education level: Not on file  Occupational History   Not on file  Tobacco Use   Smoking status: Some Days    Current packs/day: 0.00    Types: Cigarettes    Last attempt to quit: 02/25/2020    Years since quitting: 4.2   Smokeless tobacco: Never  Vaping Use   Vaping status: Never Used  Substance and Sexual Activity   Alcohol use: Yes    Comment: occassionally   Drug use: Never   Sexual activity:  Yes  Other Topics Concern   Not on file  Social History Narrative   Not on file   Social Drivers of Health   Financial Resource Strain: Not on file  Food Insecurity: Not on file  Transportation Needs: Not on file  Physical Activity: Not on file  Stress: Not on file  Social Connections: Not on file  Intimate Partner Violence: Not on file    Physical Exam: Vital signs in last 24 hours: @BP  103/63   Pulse 63   Temp 98.1 F (36.7 C)   Ht 6' 3 (1.905 m)   Wt 242 lb (109.8 kg)   SpO2 97%   BMI 30.25 kg/m  GEN: NAD EYE: Sclerae anicteric ENT: MMM CV: Non-tachycardic Pulm: CTA b/l GI: Soft, NT/ND NEURO:  Alert & Oriented x 3   Gordy Starch, MD [facility]  05/15/2024 2:23 PM

## 2024-05-15 NOTE — Op Note (Signed)
 [facility] Patient Name: Nathan Cross Procedure Date: 05/15/2024 2:28 PM MRN: [id number] Endoscopist: Gordy CHRISTELLA Starch , MD, [id number] Age: 73 Referring MD:  Date of Birth: 12-Mar-1951 Gender: Male Account #: [id number] Procedure:                Upper GI endoscopy Indications:              For therapy of duodenal stenosis (peptic, with                            multiple previous dilations, last May 2025 to 18 mm                            with balloon) Medicines:                Monitored Anesthesia Care Procedure:                Pre-Anesthesia Assessment:                           - Prior to the procedure, a History and Physical                            was performed, and patient medications and                            allergies were reviewed. The patient's tolerance of                            previous anesthesia was also reviewed. The risks                            and benefits of the procedure and the sedation                            options and risks were discussed with the patient.                            All questions were answered, and informed consent                            was obtained. Prior Anticoagulants: The patient has                            taken Eliquis (apixaban), last dose was 2 days                            prior to procedure. ASA Grade Assessment: III - A                            patient with severe systemic disease. After                            reviewing the risks and benefits, the patient was  deemed in satisfactory condition to undergo the                            procedure.                           After obtaining informed consent, the endoscope was                            passed under direct vision. Throughout the                            procedure, the patient's blood pressure, pulse, and                            oxygen saturations were monitored continuously. The                             GIF [id number] #[id number] was introduced through the                            mouth, and advanced to the second part of duodenum.                            The upper GI endoscopy was accomplished without                            difficulty. The patient tolerated the procedure                            well. Scope In: Scope Out: Findings:                 The examined esophagus was normal.                           The entire examined stomach was normal.                           An acquired benign-appearing, intrinsic moderate                            stenosis was found in the duodenal bulb and was                            traversed. A TTS dilator was passed through the                            scope. Dilation with a 16-17-18 mm pyloric balloon                            dilator was performed. The dilation site was                            examined and showed mild mucosal disruption. Complications:  No immediate complications. Estimated Blood Loss:     Estimated blood loss was minimal. Impression:               - Normal esophagus.                           - Normal stomach.                           - Acquired duodenal stenosis. Dilated with pyloric                            balloon to 18 mm.                           - No specimens collected. Recommendation:           - Patient has a contact number available for                            emergencies. The signs and symptoms of potential                            delayed complications were discussed with the                            patient. Return to normal activities tomorrow.                            Written discharge instructions were provided to the                            patient.                           - Resume previous diet.                           - Continue present medications.                           - Resume Eliquis (apixaban) at prior dose tomorrow.                            Refer to  managing physician for further adjustment                            of therapy.                           - Repeat upper endoscopy in 6-9 months for                            retreatment. Gordy CHRISTELLA Starch, MD 05/15/2024 3:16:04 PM This report has been signed electronically.

## 2024-05-16 ENCOUNTER — Telehealth: Payer: Self-pay

## 2024-05-16 NOTE — Telephone Encounter (Signed)
  Follow up Call-     05/15/2024    1:50 PM 11/01/2023   12:44 PM 07/12/2023    9:32 AM 05/03/2023    1:42 PM  Call back number  Post procedure Call Back phone  # [phone]  Permission to leave phone message Yes Yes Yes Yes     Patient questions:  Do you have a fever, pain , or abdominal swelling? No. Pain Score  0 *  Have you tolerated food without any problems? Yes.    Have you been able to return to your normal activities? Yes.    Do you have any questions about your discharge instructions: Diet   No. Medications  No. Follow up visit  No.  Do you have questions or concerns about your Care? No.  Actions: * If pain score is 4 or above: No action needed, pain <4.

## 2024-05-20 LAB — SURGICAL PATHOLOGY

## 2024-05-26 ENCOUNTER — Ambulatory Visit: Payer: Self-pay | Admitting: Internal Medicine

## 2024-06-21 ENCOUNTER — Other Ambulatory Visit: Payer: Self-pay | Admitting: Internal Medicine
